# Patient Record
Sex: Female | Born: 1955 | Race: White | Hispanic: No | Marital: Married | Smoking: Former smoker
Health system: Southern US, Community
[De-identification: ages and names within clinical notes are randomized; demographics above are authoritative.]

## PROBLEM LIST (undated history)

## (undated) DIAGNOSIS — Z8639 Personal history of other endocrine, nutritional and metabolic disease: Secondary | ICD-10-CM

## (undated) DIAGNOSIS — Z87891 Personal history of nicotine dependence: Secondary | ICD-10-CM

## (undated) DIAGNOSIS — E785 Hyperlipidemia, unspecified: Secondary | ICD-10-CM

## (undated) DIAGNOSIS — I10 Essential (primary) hypertension: Secondary | ICD-10-CM

## (undated) HISTORY — PX: APPENDECTOMY: SHX54

## (undated) HISTORY — DX: Essential (primary) hypertension: I10

## (undated) HISTORY — PX: TONSILLECTOMY AND ADENOIDECTOMY: SHX28

## (undated) HISTORY — PX: KNEE ARTHROSCOPY: SHX127

## (undated) HISTORY — DX: Hyperlipidemia, unspecified: E78.5

## (undated) HISTORY — DX: Personal history of other endocrine, nutritional and metabolic disease: Z86.39

## (undated) HISTORY — DX: Personal history of nicotine dependence: Z87.891

---

## 2000-05-06 ENCOUNTER — Encounter: Admission: RE | Admit: 2000-05-06 | Discharge: 2000-05-06 | Payer: Self-pay | Admitting: Pediatrics

## 2000-05-06 ENCOUNTER — Encounter: Payer: Self-pay | Admitting: Obstetrics and Gynecology

## 2000-05-19 ENCOUNTER — Encounter: Payer: Self-pay | Admitting: Obstetrics and Gynecology

## 2000-05-19 ENCOUNTER — Encounter: Admission: RE | Admit: 2000-05-19 | Discharge: 2000-05-19 | Payer: Self-pay | Admitting: Obstetrics and Gynecology

## 2000-12-20 ENCOUNTER — Other Ambulatory Visit: Admission: RE | Admit: 2000-12-20 | Discharge: 2000-12-20 | Payer: Self-pay | Admitting: Obstetrics and Gynecology

## 2003-04-26 ENCOUNTER — Encounter: Admission: RE | Admit: 2003-04-26 | Discharge: 2003-04-26 | Payer: Self-pay | Admitting: Obstetrics and Gynecology

## 2003-04-26 ENCOUNTER — Encounter: Payer: Self-pay | Admitting: Obstetrics and Gynecology

## 2004-04-15 ENCOUNTER — Encounter: Admission: RE | Admit: 2004-04-15 | Discharge: 2004-04-15 | Payer: Self-pay | Admitting: Orthopedic Surgery

## 2004-05-11 ENCOUNTER — Other Ambulatory Visit: Admission: RE | Admit: 2004-05-11 | Discharge: 2004-05-11 | Payer: Self-pay | Admitting: Obstetrics and Gynecology

## 2004-05-26 ENCOUNTER — Ambulatory Visit (HOSPITAL_COMMUNITY): Admission: RE | Admit: 2004-05-26 | Discharge: 2004-05-26 | Payer: Self-pay | Admitting: Obstetrics and Gynecology

## 2004-06-26 ENCOUNTER — Encounter: Admission: RE | Admit: 2004-06-26 | Discharge: 2004-06-26 | Payer: Self-pay | Admitting: *Deleted

## 2005-02-15 ENCOUNTER — Encounter: Admission: RE | Admit: 2005-02-15 | Discharge: 2005-02-15 | Payer: Self-pay | Admitting: Obstetrics and Gynecology

## 2005-05-27 ENCOUNTER — Encounter: Admission: RE | Admit: 2005-05-27 | Discharge: 2005-05-27 | Payer: Self-pay | Admitting: Obstetrics and Gynecology

## 2005-05-27 ENCOUNTER — Other Ambulatory Visit: Admission: RE | Admit: 2005-05-27 | Discharge: 2005-05-27 | Payer: Self-pay | Admitting: Obstetrics and Gynecology

## 2006-07-22 ENCOUNTER — Other Ambulatory Visit: Admission: RE | Admit: 2006-07-22 | Discharge: 2006-07-22 | Payer: Self-pay | Admitting: Obstetrics & Gynecology

## 2007-08-03 ENCOUNTER — Other Ambulatory Visit: Admission: RE | Admit: 2007-08-03 | Discharge: 2007-08-03 | Payer: Self-pay | Admitting: Obstetrics and Gynecology

## 2007-08-10 ENCOUNTER — Ambulatory Visit: Payer: Self-pay | Admitting: Surgery

## 2007-09-15 ENCOUNTER — Ambulatory Visit (HOSPITAL_COMMUNITY): Admission: RE | Admit: 2007-09-15 | Discharge: 2007-09-15 | Payer: Self-pay | Admitting: Internal Medicine

## 2008-08-26 ENCOUNTER — Other Ambulatory Visit: Admission: RE | Admit: 2008-08-26 | Discharge: 2008-08-26 | Payer: Self-pay | Admitting: Obstetrics and Gynecology

## 2009-06-02 ENCOUNTER — Ambulatory Visit: Payer: Self-pay | Admitting: Surgery

## 2010-06-22 ENCOUNTER — Emergency Department (HOSPITAL_COMMUNITY)
Admission: EM | Admit: 2010-06-22 | Discharge: 2010-06-22 | Payer: Self-pay | Source: Home / Self Care | Admitting: Emergency Medicine

## 2010-10-19 ENCOUNTER — Emergency Department (HOSPITAL_COMMUNITY): Payer: Private Health Insurance - Indemnity

## 2010-10-19 ENCOUNTER — Emergency Department (HOSPITAL_COMMUNITY)
Admission: EM | Admit: 2010-10-19 | Discharge: 2010-10-19 | Disposition: A | Discharge status: Home / Self Care | Payer: Private Health Insurance - Indemnity | Attending: Emergency Medicine | Admitting: Emergency Medicine

## 2010-10-19 DIAGNOSIS — M25569 Pain in unspecified knee: Secondary | ICD-10-CM | POA: Insufficient documentation

## 2010-10-19 DIAGNOSIS — Z79899 Other long term (current) drug therapy: Secondary | ICD-10-CM | POA: Insufficient documentation

## 2010-10-19 DIAGNOSIS — E7800 Pure hypercholesterolemia, unspecified: Secondary | ICD-10-CM | POA: Insufficient documentation

## 2010-10-19 DIAGNOSIS — R209 Unspecified disturbances of skin sensation: Secondary | ICD-10-CM | POA: Insufficient documentation

## 2010-10-19 LAB — URINALYSIS, ROUTINE W REFLEX MICROSCOPIC
Bilirubin Urine: NEGATIVE
Hgb urine dipstick: NEGATIVE
Ketones, ur: NEGATIVE mg/dL
Nitrite: NEGATIVE
Protein, ur: NEGATIVE mg/dL
Urobilinogen, UA: 0.2 mg/dL (ref 0.0–1.0)
pH: 5 (ref 5.0–8.0)

## 2010-10-19 LAB — COMPREHENSIVE METABOLIC PANEL
ALT: 17 U/L (ref 0–35)
AST: 16 U/L (ref 0–37)
Albumin: 4.2 g/dL (ref 3.5–5.2)
Alkaline Phosphatase: 67 U/L (ref 39–117)
BUN: 14 mg/dL (ref 6–23)
CO2: 25 mEq/L (ref 19–32)
Calcium: 9.6 mg/dL (ref 8.4–10.5)
Creatinine, Ser: 1.05 mg/dL (ref 0.4–1.2)
GFR calc Af Amer: >60 mL/min (ref >60)
Glucose, Bld: 100 mg/dL — ABNORMAL HIGH (ref 70–99)
Potassium: 4 mEq/L (ref 3.5–5.1)
Sodium: 136 mEq/L (ref 135–145)
Total Bilirubin: 0.6 mg/dL (ref 0.3–1.2)
Total Protein: 8.1 g/dL (ref 6.0–8.3)

## 2010-10-19 LAB — PROTIME-INR
INR: 0.96 (ref 0.00–1.49)
Prothrombin Time: 13 seconds (ref 11.6–15.2)

## 2010-10-19 LAB — POCT CARDIAC MARKERS
CKMB, poc: <1 ng/mL — ABNORMAL LOW (ref 1.0–8.0)
Myoglobin, poc: 91.1 ng/mL (ref 12–200)
Troponin i, poc: <0.05 ng/mL (ref 0.00–0.09)

## 2010-10-19 LAB — CBC
MCH: 29.7 pg (ref 26.0–34.0)
MCHC: 34.9 g/dL (ref 30.0–36.0)
MCV: 85 fL (ref 78.0–100.0)
Platelets: 269 10*3/uL (ref 150–400)
RBC: 4.99 MIL/uL (ref 3.87–5.11)
RDW: 13.2 % (ref 11.5–15.5)
WBC: 7.6 10*3/uL (ref 4.0–10.5)

## 2010-10-19 LAB — APTT: aPTT: 35 seconds (ref 24–37)

## 2010-11-10 HISTORY — PX: MEDIAL PARTIAL KNEE REPLACEMENT: SHX5965

## 2010-11-24 NOTE — Procedures (Signed)
CAROTID DUPLEX EXAM   INDICATION:  Left carotid bruit.   HISTORY:  Diabetes:  No.  Cardiac:  No.  Hypertension:  No.  Smoking:  Yes.  Previous Surgery:  CV History:  Amaurosis Fugax No, Paresthesias No, Hemiparesis No                                       RIGHT             LEFT  Brachial systolic pressure:         N/A               N/A  Brachial Doppler waveforms:  Vertebral direction of flow:        Antegrade         Antegrade  DUPLEX VELOCITIES (cm/sec)  CCA peak systolic                   97                95  ECA peak systolic                   117               86  ICA peak systolic                   90                160 (distal)  ICA end diastolic                   34                65  PLAQUE MORPHOLOGY:                  None              Heterogenous  PLAQUE AMOUNT:                      None              Moderate  PLAQUE LOCATION:                    None              ECA   IMPRESSION:  1. 40-59% stenosis noted in the left internal carotid artery.  2. Normal carotid duplex noted in the right internal carotid artery.  3. Antegrade bilateral vertebral arteries.   ___________________________________________  V. Charlena Cross, MD   MG/MEDQ  D:  08/10/2007  T:  08/11/2007  Job:  [id number]

## 2010-11-24 NOTE — Procedures (Signed)
CAROTID DUPLEX EXAM   INDICATION:  Follow up known carotid artery disease.   HISTORY:  Diabetes:  No.  Cardiac:  No.  Hypertension:  No.  Smoking:  Yes.  Previous Surgery:  CV History:  Amaurosis Fugax No, Paresthesias No, Hemiparesis No.                                       RIGHT             LEFT  Brachial systolic pressure:         118               110  Brachial Doppler waveforms:         Biphasic          Biphasic  Vertebral direction of flow:        Antegrade         Antegrade  DUPLEX VELOCITIES (cm/sec)  CCA peak systolic                   93                68  ECA peak systolic                   113               77  ICA peak systolic                   105               133 (distal)  ICA end diastolic                   48                46  PLAQUE MORPHOLOGY:                  Heterogenous      Heterogenous  PLAQUE AMOUNT:                      Mild              Mild  PLAQUE LOCATION:                    ICA               ICA   IMPRESSION:  1. 40-59% stenosis noted in the left distal internal carotid artery.  2. 20-39% stenosis noted in the right internal carotid artery.  3. Antegrade bilateral vertebral arteries.  4. Study unchanged from previous.   ___________________________________________  V. Charlena Cross, MD   MG/MEDQ  D:  06/02/2009  T:  06/03/2009  Job:  [id number]   cc:   Kari Baars, M.D.

## 2011-07-26 ENCOUNTER — Ambulatory Visit (INDEPENDENT_AMBULATORY_CARE_PROVIDER_SITE_OTHER): Payer: BC Managed Care – PPO | Admitting: *Deleted

## 2011-07-26 DIAGNOSIS — I6529 Occlusion and stenosis of unspecified carotid artery: Secondary | ICD-10-CM

## 2011-08-02 NOTE — Procedures (Unsigned)
CAROTID DUPLEX EXAM  INDICATION:  Followup carotid disease  HISTORY: Diabetes:  No Cardiac:  No Hypertension:  No Smoking:  Previous Previous Surgery:  None CV History: Amaurosis Fugax No, Paresthesias No, Hemiparesis No                                      RIGHT             LEFT Brachial systolic pressure:         110               110 Brachial Doppler waveforms:         WNL               WNL Vertebral direction of flow:        Antegrade         Antegrade DUPLEX VELOCITIES (cm/sec) CCA peak systolic                   94                84 ECA peak systolic                   102               76 ICA peak systolic                   79                70 ICA end diastolic                   23                24 PLAQUE MORPHOLOGY: PLAQUE AMOUNT:                      None              None PLAQUE LOCATION:  IMPRESSION:  Normal study of the bilateral carotid arteries.  ___________________________________________ V. Charlena Cross, MD  LT/MEDQ  D:  07/27/2011  T:  07/27/2011  Job:  [id number]

## 2011-09-03 LAB — HM PAP SMEAR: HM Pap smear: NEGATIVE

## 2011-12-03 IMAGING — CT CT HEAD W/O CM
1 series · 16 of 30 positions shown, 20 images · non-contrast
Comparison: None

CLINICAL DATA: Episode of numbness and tingling in left hand

CT HEAD WITHOUT CONTRAST
TECHNIQUE: Contiguous axial images were obtained from the base of
the skull through the vertex without contrast.

[Series 2: head routine 4.8 h37s · axial · 0.43mm/px · z∈[+133,+268]mm · 16 of 30 slices shown, 20 images]
[im 2/30  brain]
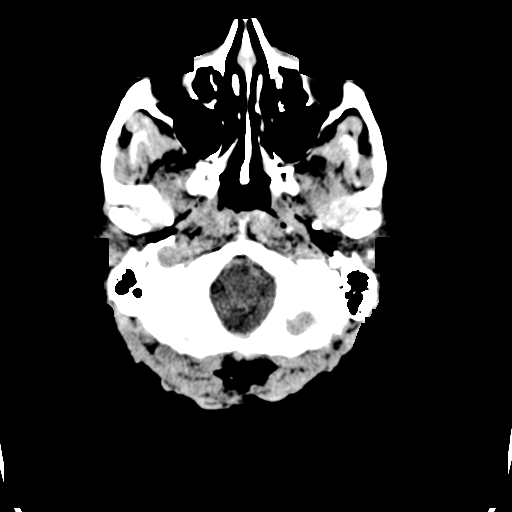
[im 2/30  bone]
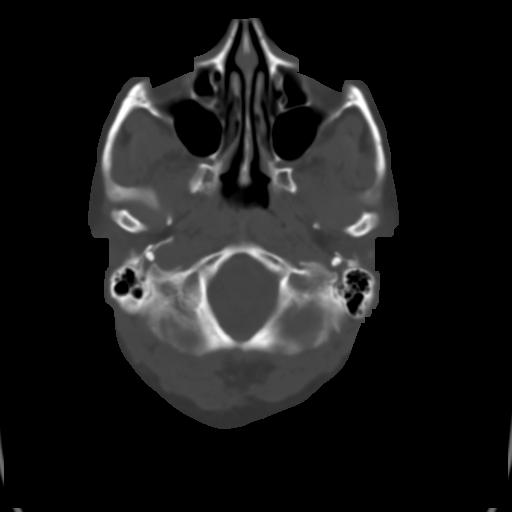
[im 4/30  brain]
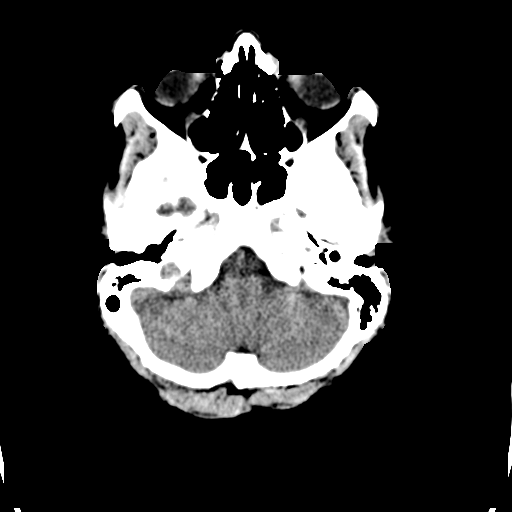
[im 6/30  brain]
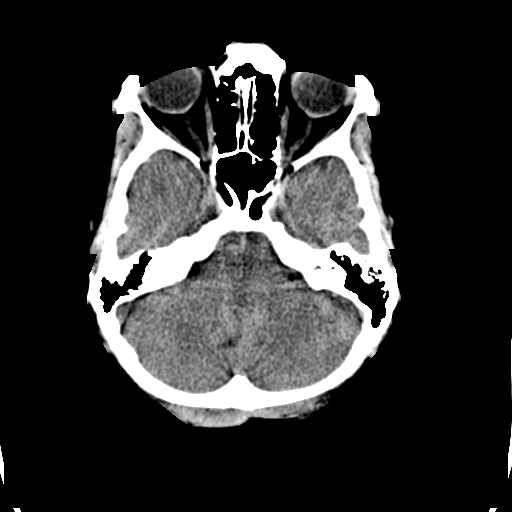
[im 8/30  brain]
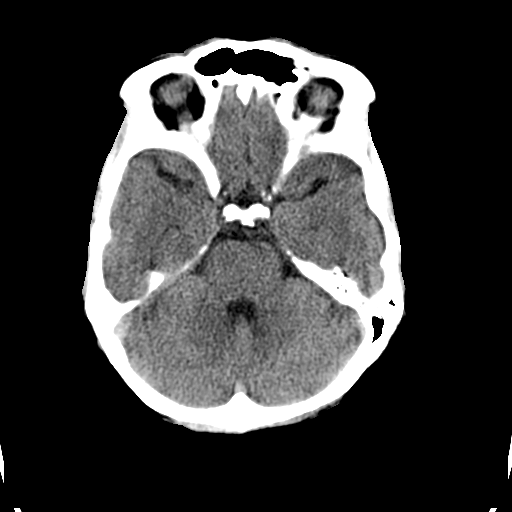
[im 9/30  brain]
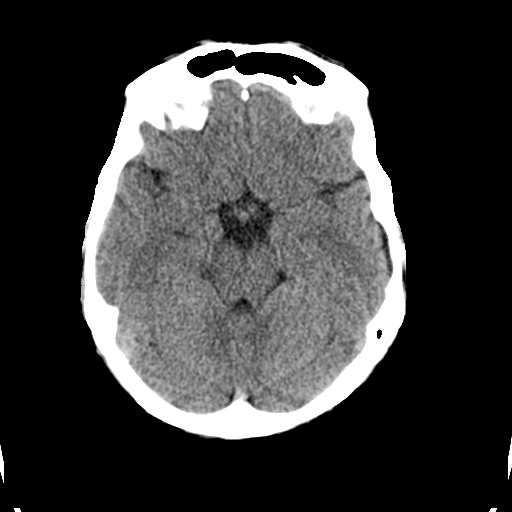
[im 9/30  bone]
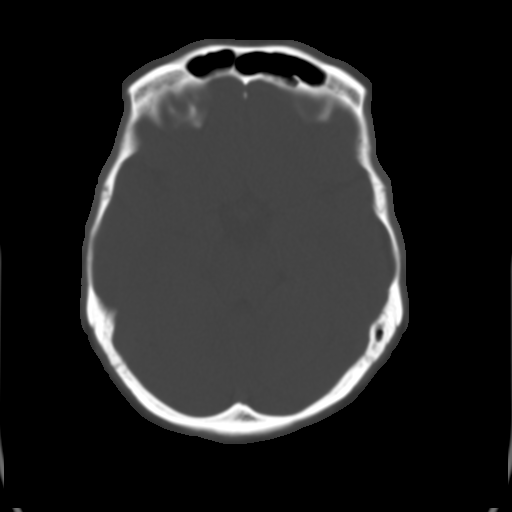
[im 11/30  brain]
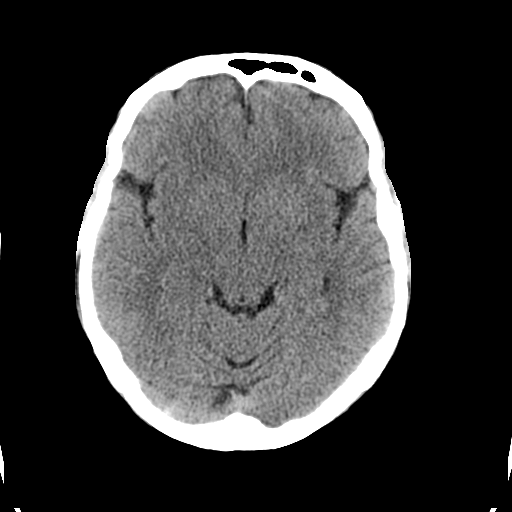
[im 13/30  brain]
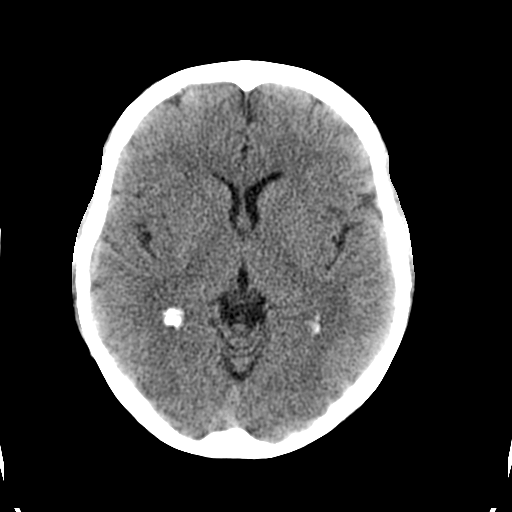
[im 15/30  brain]
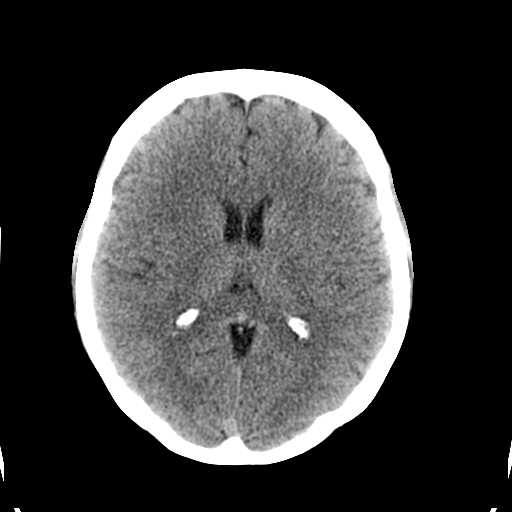
[im 16/30  brain]
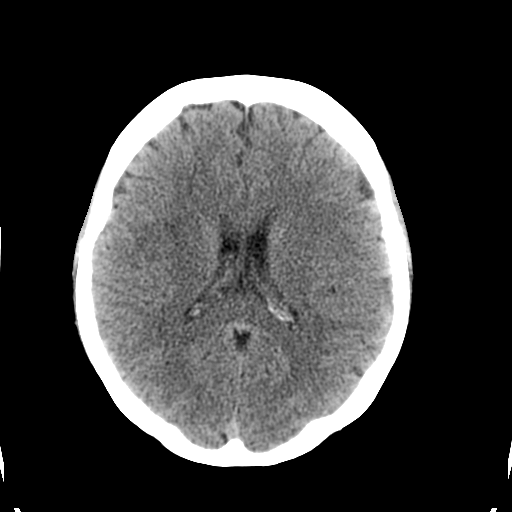
[im 16/30  bone]
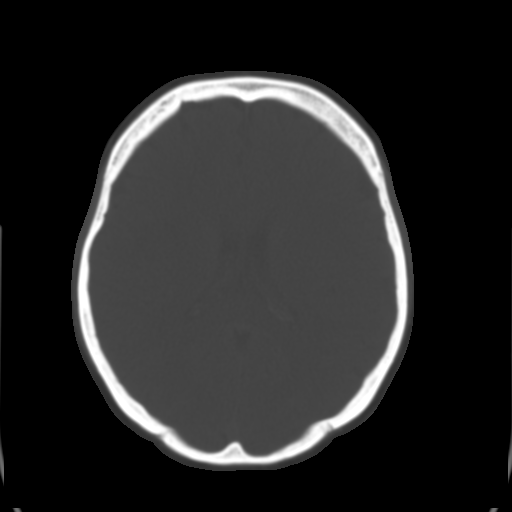
[im 18/30  brain]
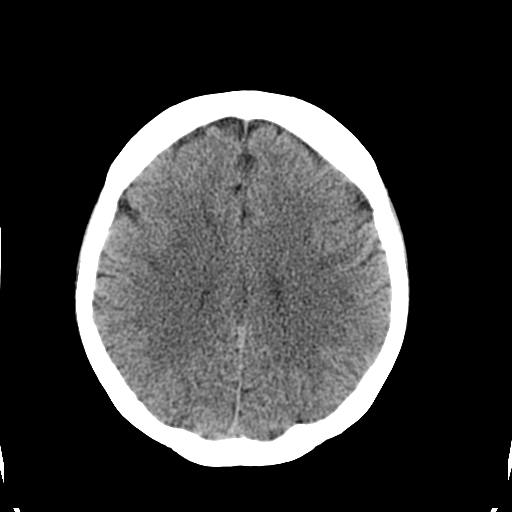
[im 20/30  brain]
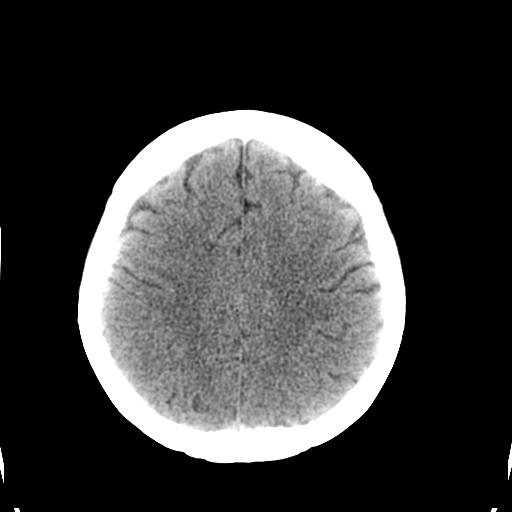
[im 22/30  brain]
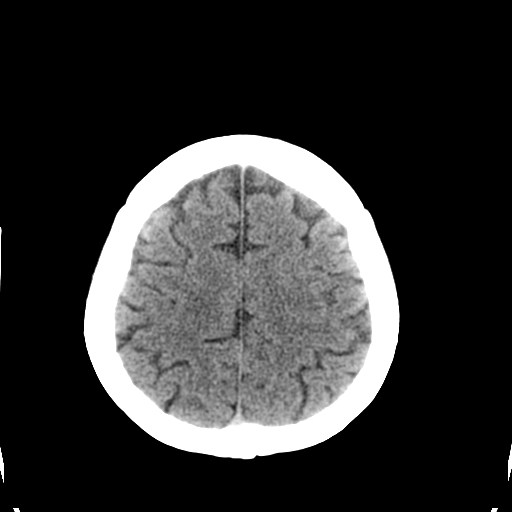
[im 23/30  brain]
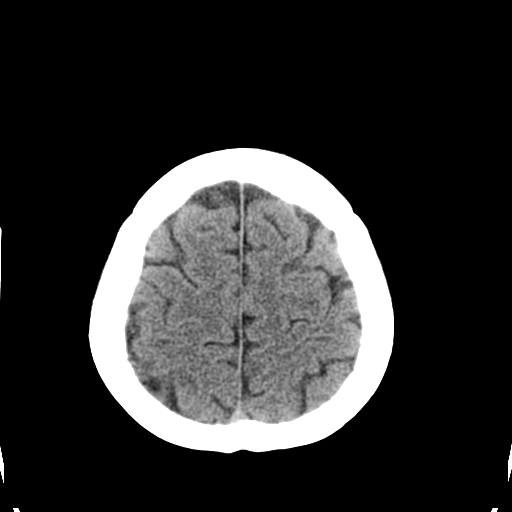
[im 23/30  bone]
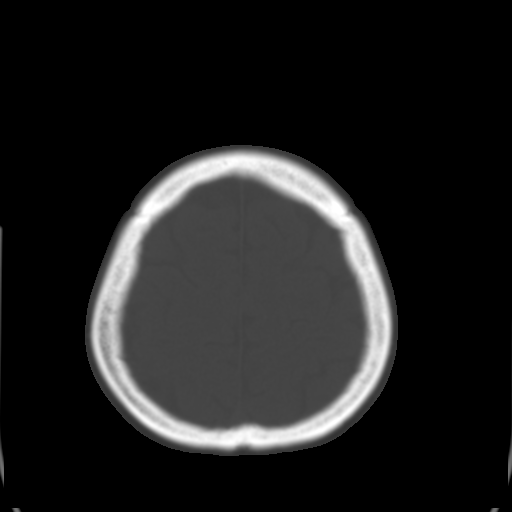
[im 25/30  brain]
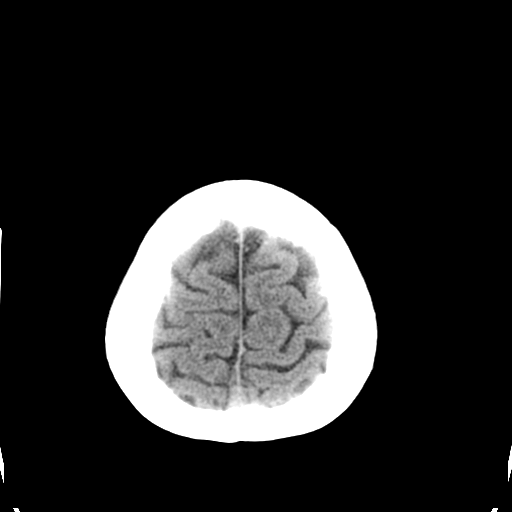
[im 27/30  brain]
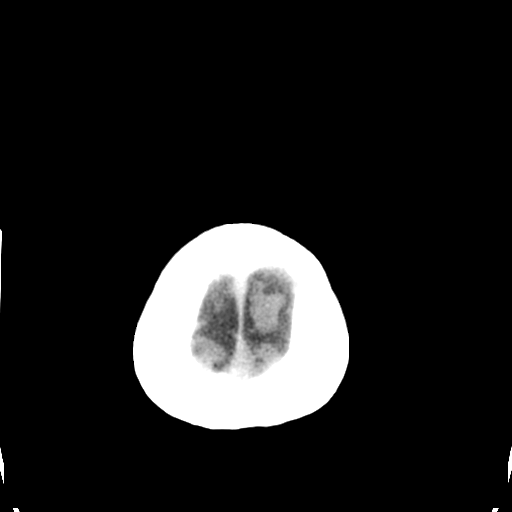
[im 29/30  brain]
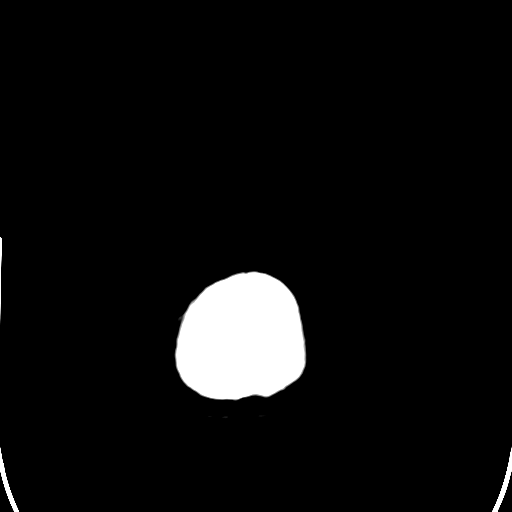

[16 of 30 positions shown; findings below may reference images not displayed]

FINDINGS: Normal ventricular morphology.
No midline shift or mass effect.
Normal appearance of brain parenchyma.
No intracranial hemorrhage, mass lesion, or acute infarction.
Visualized paranasal sinuses and mastoid air cells clear.
Bones unremarkable.
IMPRESSION: No acute intracranial abnormalities.
If symptoms persist or recur, recommend assessment by MR imaging of
the brain with and without contrast to further evaluate.

## 2013-09-06 ENCOUNTER — Ambulatory Visit: Payer: Self-pay | Admitting: Nurse Practitioner

## 2013-09-24 ENCOUNTER — Encounter: Payer: Self-pay | Admitting: Nurse Practitioner

## 2013-09-24 ENCOUNTER — Ambulatory Visit (INDEPENDENT_AMBULATORY_CARE_PROVIDER_SITE_OTHER): Payer: BC Managed Care – PPO | Admitting: Nurse Practitioner

## 2013-09-24 VITALS — BP 140/74 | HR 76 | Ht 63.25 in | Wt 169.0 lb

## 2013-09-24 DIAGNOSIS — R319 Hematuria, unspecified: Secondary | ICD-10-CM

## 2013-09-24 DIAGNOSIS — Z96659 Presence of unspecified artificial knee joint: Secondary | ICD-10-CM

## 2013-09-24 DIAGNOSIS — Z01419 Encounter for gynecological examination (general) (routine) without abnormal findings: Secondary | ICD-10-CM

## 2013-09-24 DIAGNOSIS — Z0000 Encounter for general adult medical examination without abnormal findings: Secondary | ICD-10-CM

## 2013-09-24 LAB — POCT URINALYSIS DIPSTICK
Bilirubin, UA: NEGATIVE
Glucose, UA: NEGATIVE
Ketones, UA: NEGATIVE
Nitrite, UA: NEGATIVE
Protein, UA: NEGATIVE
Urobilinogen, UA: NEGATIVE
pH, UA: 5

## 2013-09-24 NOTE — Progress Notes (Signed)
Patient ID: Karen Jimenez, female   DOB: Oct 24, 1955, 58 y.o.   MRN: [id number] 58 y.o. G2P2002 Married Caucasian Fe here for annual exam.  May consider total knee replacement in the future. Now sees Dr. Despina Hick.  Patient's last menstrual period was 04/11/2009.          Sexually active: no  The current method of family planning is abstinence and post menopausal status.    Exercising: yes  Gym/ health club routine includes personal trainer 3 days per week. Smoker:  yes  Health Maintenance: Pap:  09/03/11, WNL, neg HR HPV MMG:  08/10/12, Bi-Rads 1: negative Colonoscopy:  12/2012, 2 polyps benign, repeat 5 years due to family history BMD:  08/2011, normal TDaP:  UTD?  Labs:  HB: declined Urine:  Trace RBC, 1+ leuk's, pH 5.0   reports that she has been smoking Cigarettes.  She has a 7.5 pack-year smoking history. She has never used smokeless tobacco. She reports that she does not drink alcohol or use illicit drugs.  Past Medical History  Diagnosis Date  . History of smoking     Past Surgical History  Procedure Laterality Date  . Tonsillectomy and adenoidectomy    . Appendectomy    . Knee arthroscopy Right 2011, 2012  . Medial partial knee replacement  11/2010    Current Outpatient Prescriptions  Medication Sig Dispense Refill  . calcium carbonate (OS-CAL) 600 MG TABS tablet Take 600 mg by mouth 2 (two) times daily with a meal.      . cholecalciferol (VITAMIN D) 1000 UNITS tablet Take 1,000 Units by mouth daily.      . Omega-3 Fatty Acids (FISH OIL PO) Take by mouth.      . vitamin C (ASCORBIC ACID) 500 MG tablet Take 500 mg by mouth daily.       No current facility-administered medications for this visit.    Family History  Problem Relation Age of Onset  . Colon cancer Mother 45  . Lung cancer Father 36    ROS:  Pertinent items are noted in HPI.  Otherwise, a comprehensive ROS was negative.  Exam:   BP 140/74  Pulse 76  Ht 5' 3.25" (1.607 m)  Wt 169 lb (76.658 kg)  BMI  29.68 kg/m2  LMP 04/11/2009 Height: 5' 3.25" (160.7 cm)  Ht Readings from Last 3 Encounters:  09/24/13 5' 3.25" (1.607 m)  weight last year at 174.  General appearance: alert, cooperative and appears stated age Head: Normocephalic, without obvious abnormality, atraumatic Neck: no adenopathy, supple, symmetrical, trachea midline and thyroid normal to inspection and palpation Lungs: clear to auscultation bilaterally Breasts: normal appearance, no masses or tenderness Heart: regular rate and rhythm Abdomen: soft, non-tender; no masses,  no organomegaly Extremities: extremities normal, atraumatic, no cyanosis or edema Skin: Skin color, texture, turgor normal. No rashes or lesions Lymph nodes: Cervical, supraclavicular, and axillary nodes normal. No abnormal inguinal nodes palpated Neurologic: Grossly normal   Pelvic: External genitalia:  no lesions              Urethra:  normal appearing urethra with no masses, tenderness or lesions              Bartholin's and Skene's: normal                 Vagina: normal appearing vagina with normal color and discharge, no lesions              Cervix: anteverted  Pap taken: no Bimanual Exam:  Uterus:  normal size, contour, position, consistency, mobility, non-tender              Adnexa: no mass, fullness, tenderness               Rectovaginal: Confirms               Anus:  normal sphincter tone, no lesions  A:  Well Woman with normal exam  Postmenopausal  no HRT  S/P partial knee 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

## 2013-09-24 NOTE — Patient Instructions (Signed)

## 2013-09-25 LAB — URINALYSIS, MICROSCOPIC ONLY
Bacteria, UA: NONE SEEN
Casts: NONE SEEN
Crystals: NONE SEEN

## 2013-09-25 LAB — URINE CULTURE
Colony Count: NO GROWTH
Organism ID, Bacteria: NO GROWTH

## 2013-09-25 NOTE — Progress Notes (Signed)
Encounter reviewed by Dr. Darcus Edds Silva.  

## 2013-09-28 ENCOUNTER — Telehealth: Payer: Self-pay | Admitting: Nurse Practitioner

## 2013-09-28 NOTE — Telephone Encounter (Signed)
Message copied by Joeseph AmorFAST, Taseen Marasigan L on Fri Sep 28, 2013  2:00 PM ------      Message from: Verner CholLEONARD, DEBORAH S      Created: Wed Sep 26, 2013  1:20 PM       Notify patient no RBCs noted on microscopic and culture was negative.      Is she having any vaginal symptoms? The specimen showed some WBC's which can come from vaginal irritation. Will need OV to reassess ------

## 2013-09-28 NOTE — Telephone Encounter (Signed)
Spoke with patient and message from Karen Jimenez CNM given. Patient denies any vaginal complaints. States "I feel fine".  Patient will monitor symptoms and return call if any change.   Routing to provider for final review. Patient agreeable to disposition. Will close encounter

## 2013-09-28 NOTE — Telephone Encounter (Signed)
Patient wants to know if we have received results from appt on 09/25/13 yet

## 2014-05-13 ENCOUNTER — Encounter: Payer: Self-pay | Admitting: Nurse Practitioner

## 2014-06-29 ENCOUNTER — Emergency Department (HOSPITAL_BASED_OUTPATIENT_CLINIC_OR_DEPARTMENT_OTHER)
Admission: EM | Admit: 2014-06-29 | Discharge: 2014-06-29 | Disposition: A | Discharge status: Home / Self Care | Payer: BC Managed Care – PPO | Attending: Emergency Medicine | Admitting: Emergency Medicine

## 2014-06-29 ENCOUNTER — Encounter (HOSPITAL_BASED_OUTPATIENT_CLINIC_OR_DEPARTMENT_OTHER): Payer: Self-pay | Admitting: *Deleted

## 2014-06-29 DIAGNOSIS — Z79899 Other long term (current) drug therapy: Secondary | ICD-10-CM | POA: Diagnosis not present

## 2014-06-29 DIAGNOSIS — Z720 Tobacco use: Secondary | ICD-10-CM | POA: Diagnosis not present

## 2014-06-29 DIAGNOSIS — M79605 Pain in left leg: Secondary | ICD-10-CM | POA: Diagnosis present

## 2014-06-29 DIAGNOSIS — M79604 Pain in right leg: Secondary | ICD-10-CM | POA: Insufficient documentation

## 2014-06-29 LAB — URINE MICROSCOPIC-ADD ON

## 2014-06-29 LAB — BASIC METABOLIC PANEL
Anion gap: 16 — ABNORMAL HIGH (ref 5–15)
BUN: 17 mg/dL (ref 6–23)
CALCIUM: 9.4 mg/dL (ref 8.4–10.5)
CO2: 22 mEq/L (ref 19–32)
Chloride: 108 mEq/L (ref 96–112)
Creatinine, Ser: 1.2 mg/dL — ABNORMAL HIGH (ref 0.50–1.10)
GFR calc Af Amer: 57 mL/min — ABNORMAL LOW (ref >90)
GFR calc non Af Amer: 49 mL/min — ABNORMAL LOW (ref >90)
Glucose, Bld: 120 mg/dL — ABNORMAL HIGH (ref 70–99)
Potassium: 3.9 mEq/L (ref 3.7–5.3)
Sodium: 146 mEq/L (ref 137–147)

## 2014-06-29 LAB — URINALYSIS, ROUTINE W REFLEX MICROSCOPIC
Bilirubin Urine: NEGATIVE
Glucose, UA: NEGATIVE mg/dL
Hgb urine dipstick: NEGATIVE
Ketones, ur: NEGATIVE mg/dL
Nitrite: NEGATIVE
Protein, ur: NEGATIVE mg/dL
Specific Gravity, Urine: 1.017 (ref 1.005–1.030)
UROBILINOGEN UA: 0.2 mg/dL (ref 0.0–1.0)
pH: 5.5 (ref 5.0–8.0)

## 2014-06-29 MED ORDER — OXYCODONE-ACETAMINOPHEN 5-325 MG PO TABS: 1.0000 | ORAL_TABLET | Freq: Four times a day (QID) | ORAL | Status: DC | PRN

## 2014-06-29 MED ORDER — OXYCODONE-ACETAMINOPHEN 5-325 MG PO TABS
2.0000 | ORAL_TABLET | Freq: Once | ORAL | Status: DC
  Filled 2014-06-29: qty 2

## 2014-06-29 NOTE — ED Notes (Signed)
Pt reports bilateral leg pain x 1 day.  Denies injury.

## 2014-06-29 NOTE — ED Provider Notes (Signed)
CSN: [id number]     Arrival date & time 06/29/14  1735 History  This chart was scribed for Karen CookeyMegan Docherty, MD by Freida Busmaniana Omoyeni, ED Scribe. This patient was seen in room MH10/MH10 and the patient's care was started 6:15 PM.    Chief Complaint  Patient presents with  . Leg Pain      Patient is a 58 y.o. female presenting with leg pain. The history is provided by the patient. No language interpreter was used.  Leg Pain Location:  Leg Injury: no   Leg location:  L leg and R leg Pain details:    Quality:  Throbbing   Severity:  Moderate   Onset quality:  Gradual   Timing:  Constant   Progression:  Worsening Associated symptoms: no fever     HPI Comments:  Karen Jimenez is a 58 y.o. female who presents to the Emergency Department complaining of throbbing constant pain to her BLE that started last night. She also notes soreness to her bilateral hips and states pain radiates down to her feet. Pt states she was walking a lot yesterday christmas shopping but notes she was wearing comfortable shoes. She reports wearing compression stockings without relief. She denies swelling to her BLE, BLE weakness/numbness, cough, rhinorrhea, CP, SOB, nausea, vomiting, congestion, bladder/bowel incontinence, shoulder pain, pain to her BUE and abdominal pain. She also denies recent air/car travel.    Past Medical History  Diagnosis Date  . History of smoking    Past Surgical History  Procedure Laterality Date  . Tonsillectomy and adenoidectomy    . Appendectomy    . Knee arthroscopy Right 2011, 2012  . Medial partial knee replacement  11/2010   Family History  Problem Relation Age of Onset  . Colon cancer Mother 481  . Lung cancer Father 4178   History  Substance Use Topics  . Smoking status: Current Every Day Smoker -- 0.25 packs/day for 30 years    Types: Cigarettes  . Smokeless tobacco: Never Used  . Alcohol Use: No   OB History    Gravida Para Term Preterm AB TAB SAB Ectopic  Multiple Living   2 2 2       2      Review of Systems  Constitutional: Negative for fever and chills.  HENT: Negative for congestion, rhinorrhea and sore throat.   Respiratory: Negative for cough.   Cardiovascular: Negative for chest pain.  Gastrointestinal: Negative for nausea, vomiting, diarrhea and constipation.  Genitourinary: Negative for dysuria and hematuria.  Musculoskeletal: Positive for myalgias.  Neurological: Negative for weakness and numbness.  All other systems reviewed and are negative.     Allergies  Hydrocodone  Home Medications   Prior to Admission medications   Medication Sig Start Date End Date Taking? Authorizing Provider  calcium carbonate (OS-CAL) 600 MG TABS tablet Take 600 mg by mouth 2 (two) times daily with a meal.    Historical Provider, MD  cholecalciferol (VITAMIN D) 1000 UNITS tablet Take 1,000 Units by mouth daily.    Historical Provider, MD  Omega-3 Fatty Acids (FISH OIL PO) Take by mouth.    Historical Provider, MD  oxyCODONE-acetaminophen (PERCOCET) 5-325 MG per tablet Take 1 tablet by mouth every 6 (six) hours as needed. 06/29/14   Karen CookeyMegan Docherty, MD  vitamin C (ASCORBIC ACID) 500 MG tablet Take 500 mg by mouth daily.    Historical Provider, MD   BP 138/43 mmHg  Pulse 81  Temp(Src) 98.3 F (36.8 C) (Oral)  Resp  18  Ht 5\' 4"  (1.626 m)  Wt 170 lb (77.111 kg)  BMI 29.17 kg/m2  SpO2 99%  LMP 04/11/2009 Physical Exam  Constitutional: She is oriented to person, place, and time. She appears well-developed and well-nourished. No distress.  HENT:  Head: Normocephalic.  Mouth/Throat: Oropharynx is clear and moist.  Eyes: Pupils are equal, round, and reactive to light.  Neck: Neck supple.  Cardiovascular: Normal rate, regular rhythm and normal heart sounds.   Pulmonary/Chest: Effort normal and breath sounds normal. No respiratory distress. She has no wheezes.  Abdominal: Soft. She exhibits no distension. There is no tenderness. There is no  rebound and no guarding.  Musculoskeletal: She exhibits tenderness. She exhibits no edema.  Generalized muscular tenderness without overlying skin changes or eedma  Neurological: She is alert and oriented to person, place, and time.  Skin: Skin is warm and dry.  Psychiatric: She has a normal mood and affect.  Nursing note and vitals reviewed.   ED Course  Procedures (including critical care time) Labs Review Labs Reviewed  BASIC METABOLIC PANEL - Abnormal; Notable for the following:    Glucose, Bld 120 (*)    Creatinine, Ser 1.20 (*)    GFR calc non Af Amer 49 (*)    GFR calc Af Amer 57 (*)    Anion gap 16 (*)    All other components within normal limits  URINALYSIS, ROUTINE W REFLEX MICROSCOPIC - Abnormal; Notable for the following:    Leukocytes, UA TRACE (*)    All other components within normal limits  URINE CULTURE  URINE MICROSCOPIC-ADD ON    Imaging Review No results found.   EKG Interpretation None      MDM   Final diagnoses:  Bilateral leg pain    Pt is a 58 y.o. female with Pmhx as above who presents with BL generalized leg pain for 1 day w/o known injury, though di dwalk more than usual out christmas shopping yesterday. No s/sx of infection ofther than myalgias and PE benign except for symmetric muscular tenderness of both legs. BMP with mild Cr elevated, otherwise nml, UA nml. I do not think Cr explains symptoms and I feel she may have prodromal viral syndrome symptoms. I feel she is safe for d/c      Karen EastMargaret M Teems evaluation in the Emergency Department is complete. It has been determined that no acute conditions requiring further emergency intervention are present at this time. The patient/guardian have been advised of the diagnosis and plan. We have discussed signs and symptoms that warrant return to the ED, such as changes or worsening in symptoms, worsening pain, fever, inability to tolerate liquids.     I personally performed the services  described in this documentation, which was scribed in my presence. The recorded information has been reviewed and is accurate.     Karen CookeyMegan Docherty, MD 07/02/14 [phone]

## 2014-06-29 NOTE — Discharge Instructions (Signed)

## 2014-07-01 LAB — URINE CULTURE
Colony Count: NO GROWTH
Culture: NO GROWTH

## 2014-10-03 ENCOUNTER — Ambulatory Visit (INDEPENDENT_AMBULATORY_CARE_PROVIDER_SITE_OTHER): Payer: BLUE CROSS/BLUE SHIELD | Admitting: Nurse Practitioner

## 2014-10-03 ENCOUNTER — Encounter: Payer: Self-pay | Admitting: Nurse Practitioner

## 2014-10-03 VITALS — BP 110/74 | HR 76 | Ht 63.25 in | Wt 163.0 lb

## 2014-10-03 DIAGNOSIS — Z01419 Encounter for gynecological examination (general) (routine) without abnormal findings: Secondary | ICD-10-CM

## 2014-10-03 DIAGNOSIS — R319 Hematuria, unspecified: Secondary | ICD-10-CM

## 2014-10-03 DIAGNOSIS — Z0000 Encounter for general adult medical examination without abnormal findings: Secondary | ICD-10-CM | POA: Diagnosis not present

## 2014-10-03 LAB — POCT URINALYSIS DIPSTICK
Bilirubin, UA: NEGATIVE
Glucose, UA: NEGATIVE
Ketones, UA: NEGATIVE
Leukocytes, UA: NEGATIVE
Nitrite, UA: NEGATIVE
Protein, UA: NEGATIVE
Urobilinogen, UA: NEGATIVE
pH, UA: 5

## 2014-10-03 NOTE — Patient Instructions (Signed)

## 2014-10-03 NOTE — Progress Notes (Signed)
Patient ID: Karen Jimenez, female   DOB: 26-Jul-1955, 59 y.o.   MRN: [id number] 59 y.o. G75P2002 Married  Caucasian Fe here for annual exam.    Patient's last menstrual period was 04/11/2009 (within days).          Sexually active: No.  The current method of family planning is abstinence and post menopausal status.    Exercising: No.  The patient does not participate in regular exercise at present. Smoker:  yes  Health Maintenance: Pap:  09/03/11, negative with neg HR HPV MMG:  10/18/13, Bi-Rads 2:  Benign Colonoscopy:  12/2012, 2 benign polyps, repeat in 5 years due to family history BMD:   2013, normal TDaP:  UTD Labs:  HB:  Declined, PCP in June  Urine:  Trace RBC   reports that she has been smoking Cigarettes.  She has a 7.5 pack-year smoking history. She has never used smokeless tobacco. She reports that she does not drink alcohol or use illicit drugs.  Past Medical History  Diagnosis Date  . History of smoking     Past Surgical History  Procedure Laterality Date  . Tonsillectomy and adenoidectomy    . Appendectomy    . Knee arthroscopy Right 2011, 2012  . Medial partial knee replacement  11/2010    Current Outpatient Prescriptions  Medication Sig Dispense Refill  . calcium carbonate (OS-CAL) 600 MG TABS tablet Take 600 mg by mouth 2 (two) times daily with a meal.    . cholecalciferol (VITAMIN D) 1000 UNITS tablet Take 1,000 Units by mouth daily.    . Omega-3 Fatty Acids (FISH OIL PO) Take by mouth.    . vitamin C (ASCORBIC ACID) 500 MG tablet Take 500 mg by mouth daily.     No current facility-administered medications for this visit.    Family History  Problem Relation Age of Onset  . Colon cancer Mother 24  . Lung cancer Father 20    ROS:  Pertinent items are noted in HPI.  Otherwise, a comprehensive ROS was negative.  Exam:   BP 110/74 mmHg  Pulse 76  Ht 5' 3.25" (1.607 m)  Wt 163 lb (73.936 kg)  BMI 28.63 kg/m2  LMP 04/11/2009 (Within Days) Height: 5' 3.25"  (160.7 cm) Ht Readings from Last 3 Encounters:  10/03/14 5' 3.25" (1.607 m)  06/29/14  (1.626 m)  09/24/13 5' 3.25" (1.607 m)    General appearance: alert, cooperative and appears stated age Head: Normocephalic, without obvious abnormality, atraumatic Neck: no adenopathy, supple, symmetrical, trachea midline and thyroid normal to inspection and palpation Lungs: clear to auscultation bilaterally Breasts: normal appearance, no masses or tenderness Heart: regular rate and rhythm Abdomen: soft, non-tender; no masses,  no organomegaly Extremities: extremities normal, atraumatic, no cyanosis or edema Skin: Skin color, texture, turgor normal. No rashes or lesions Lymph nodes: Cervical, supraclavicular, and axillary nodes normal. No abnormal inguinal nodes palpated Neurologic: Grossly normal   Pelvic: External genitalia:  no lesions              Urethra:  normal appearing urethra with no masses, tenderness or lesions              Bartholin's and Skene's: normal                 Vagina: normal appearing vagina with normal color and discharge, no lesions              Cervix: anteverted  Pap taken: Yes.   Bimanual Exam:  Uterus:  normal size, contour, position, consistency, mobility, non-tender              Adnexa: no mass, fullness, tenderness               Rectovaginal: Confirms               Anus:  normal sphincter tone, no lesions  Chaperone present:  yes  A:  Well Woman with normal exam  Postmenopausal no HRT S/P partial knee replacement on right 11/2010 Haematuria - asymptomatic   P:   Reviewed health and wellness pertinent to exam  Pap smear taken today  Mammogram is due 4/16  Follow with pap and urine  Counseled on breast self exam, mammography screening, adequate intake of calcium and vitamin D, diet and exercise, Kegel's exercises return annually or prn  An After Visit Summary was printed and given to the patient.

## 2014-10-04 LAB — URINALYSIS, MICROSCOPIC ONLY
BACTERIA UA: NONE SEEN
Casts: NONE SEEN
Crystals: NONE SEEN
Squamous Epithelial / LPF: NONE SEEN

## 2014-10-06 NOTE — Progress Notes (Signed)
Encounter reviewed by Dr. Ivan Lacher Silva.  

## 2014-10-07 LAB — IPS PAP TEST WITH HPV

## 2015-05-26 ENCOUNTER — Telehealth: Payer: Self-pay | Admitting: Nurse Practitioner

## 2015-05-26 NOTE — Telephone Encounter (Signed)
Left patient a message to call back to reschedule a future appointment that was cancelled by the provider. °

## 2015-10-06 ENCOUNTER — Ambulatory Visit: Payer: BLUE CROSS/BLUE SHIELD | Admitting: Nurse Practitioner

## 2015-12-31 ENCOUNTER — Encounter: Payer: Self-pay | Admitting: Nurse Practitioner

## 2016-09-02 ENCOUNTER — Encounter: Payer: Self-pay | Admitting: Nurse Practitioner

## 2016-09-02 NOTE — Progress Notes (Signed)
Patient ID: Karen EastMargaret M Jimenez, female   DOB: 04/14/1956, 61 y.o.   MRN: [id number]  61 y.o. G2P2002 Married  Caucasian Fe here for annual exam.  Treated for UTI in 07/2016 with Cipro.     Patient's last menstrual period was 04/11/2009 (within days).          Sexually active: No.  The current method of family planning is post menopausal status.    Exercising: No.  The patient does not participate in regular exercise at present. Smoker:  yes  Health Maintenance: Pap: 10/03/14, Negative with neg HR HPV  09/03/11, Negative with neg HR HPV MMG: 10/24/15 with Right Diagnostic MMG on 11/06/15 , Bi-Rads 2:  Benign, screening in one year Colonoscopy: 12/2012, 2 benign polyps, repeat in 5 years due to family history BMD: 2013, normal TDaP: UTD Hep C: 10/14/15 neg HIV: unsure Labs: PCP takes care of all labs UA: RBC=Trace   reports that she has been smoking Cigarettes.  She has a 15.00 pack-year smoking history. She has never used smokeless tobacco. She reports that she does not drink alcohol or use drugs.  Past Medical History:  Diagnosis Date  . History of smoking   . Hx of hyperlipidemia     Past Surgical History:  Procedure Laterality Date  . APPENDECTOMY    . KNEE ARTHROSCOPY Right 2011, 2012  . MEDIAL PARTIAL KNEE REPLACEMENT  11/2010  . TONSILLECTOMY AND ADENOIDECTOMY      Current Outpatient Prescriptions  Medication Sig Dispense Refill  . calcium carbonate (OS-CAL) 600 MG TABS tablet Take 600 mg by mouth 2 (two) times daily with a meal.    . cholecalciferol (VITAMIN D) 1000 UNITS tablet Take 1,000 Units by mouth daily.    . rosuvastatin (CRESTOR) 5 MG tablet Take 5 mg by mouth daily.    . vitamin C (ASCORBIC ACID) 500 MG tablet Take 500 mg by mouth daily.     No current facility-administered medications for this visit.     Family History  Problem Relation Age of Onset  . Colon cancer Mother 9781  . Lung cancer Father 478    ROS:  Pertinent items are noted in HPI.  Otherwise, a  comprehensive ROS was negative.  Exam:   BP (!) 148/60 (BP Location: Right Arm, Patient Position: Sitting, Cuff Size: Large)   Pulse (!) 110   Resp 16   Ht 5\' 3"  (1.6 m)   Wt 168 lb (76.2 kg)   LMP 04/11/2009 (Within Days)   BMI 29.76 kg/m  Height: 5\' 3"  (160 cm) Ht Readings from Last 3 Encounters:  09/03/16 5\' 3"  (1.6 m)  10/03/14 5' 3.25" (1.607 m)  06/29/14 5\' 4"  (1.626 m)    General appearance: alert, cooperative and appears stated age Head: Normocephalic, without obvious abnormality, atraumatic Neck: no adenopathy, supple, symmetrical, trachea midline and thyroid normal to inspection and palpation Lungs: clear to auscultation bilaterally Breasts: normal appearance, no masses or tenderness Heart: regular rate and rhythm Abdomen: soft, non-tender; no masses,  no organomegaly Extremities: extremities normal, atraumatic, no cyanosis or edema Skin: Skin color, texture, turgor normal. No rashes or lesions Lymph nodes: Cervical, supraclavicular, and axillary nodes normal. No abnormal inguinal nodes palpated Neurologic: Grossly normal   Pelvic: External genitalia:  no lesions              Urethra:  normal appearing urethra with no masses, tenderness or lesions              Bartholin's and Skene's:  normal                 Vagina: normal appearing vagina with normal color and discharge, no lesions              Cervix: anteverted              Pap taken: No. Bimanual Exam:  Uterus:  normal size, contour, position, consistency, mobility, non-tender              Adnexa: no mass, fullness, tenderness               Rectovaginal: Confirms               Anus:  normal sphincter tone, no lesions  Chaperone present: yes  A:  Well Woman with normal exam  Postmenopausal no HRT S/P partial knee replacement on right 11/2010 Haematuria - asymptomatic   P:   Reviewed health and wellness pertinent to exam  Pap smear not done  Mammogram is due 10/2016  Counseled on  breast self exam, mammography screening, adequate intake of calcium and vitamin D, diet and exercise, Kegel's exercises return annually or prn  An After Visit Summary was printed and given to the patient.

## 2016-09-03 ENCOUNTER — Encounter: Payer: Self-pay | Admitting: Nurse Practitioner

## 2016-09-03 ENCOUNTER — Ambulatory Visit (INDEPENDENT_AMBULATORY_CARE_PROVIDER_SITE_OTHER): Payer: Commercial Managed Care - PPO | Admitting: Nurse Practitioner

## 2016-09-03 VITALS — BP 148/60 | HR 110 | Resp 16 | Ht 63.0 in | Wt 168.0 lb

## 2016-09-03 DIAGNOSIS — N390 Urinary tract infection, site not specified: Secondary | ICD-10-CM

## 2016-09-03 DIAGNOSIS — Z0000 Encounter for general adult medical examination without abnormal findings: Secondary | ICD-10-CM | POA: Diagnosis not present

## 2016-09-03 DIAGNOSIS — Z01419 Encounter for gynecological examination (general) (routine) without abnormal findings: Secondary | ICD-10-CM | POA: Diagnosis not present

## 2016-09-03 DIAGNOSIS — R319 Hematuria, unspecified: Secondary | ICD-10-CM

## 2016-09-03 LAB — POCT URINALYSIS DIPSTICK
Bilirubin, UA: NEGATIVE
GLUCOSE UA: NEGATIVE
Ketones, UA: NEGATIVE
Leukocytes, UA: NEGATIVE
NITRITE UA: NEGATIVE
Protein, UA: NEGATIVE
UROBILINOGEN UA: NEGATIVE
pH, UA: 5

## 2016-09-03 NOTE — Patient Instructions (Signed)

## 2016-09-04 LAB — URINALYSIS, MICROSCOPIC ONLY
Bacteria, UA: NONE SEEN [HPF]
CASTS: NONE SEEN [LPF]
CRYSTALS: NONE SEEN [HPF]
RBC / HPF: NONE SEEN RBC/HPF (ref <=2)
Yeast: NONE SEEN [HPF]

## 2016-09-05 LAB — URINE CULTURE: Organism ID, Bacteria: NO GROWTH

## 2016-09-05 NOTE — Progress Notes (Signed)
Encounter reviewed by Dr. Nehemyah Foushee Amundson C. Silva.  

## 2017-01-01 ENCOUNTER — Encounter (HOSPITAL_BASED_OUTPATIENT_CLINIC_OR_DEPARTMENT_OTHER): Payer: Self-pay | Admitting: Emergency Medicine

## 2017-01-01 ENCOUNTER — Emergency Department (HOSPITAL_BASED_OUTPATIENT_CLINIC_OR_DEPARTMENT_OTHER)
Admission: EM | Admit: 2017-01-01 | Discharge: 2017-01-01 | Disposition: A | Discharge status: Home / Self Care | Payer: 59 | Attending: Emergency Medicine | Admitting: Emergency Medicine

## 2017-01-01 ENCOUNTER — Emergency Department (HOSPITAL_BASED_OUTPATIENT_CLINIC_OR_DEPARTMENT_OTHER)
Admission: EM | Admit: 2017-01-01 | Discharge: 2017-01-01 | Disposition: A | Discharge status: Home / Self Care | Payer: 59 | Source: Home / Self Care | Attending: Emergency Medicine | Admitting: Emergency Medicine

## 2017-01-01 DIAGNOSIS — R2231 Localized swelling, mass and lump, right upper limb: Secondary | ICD-10-CM | POA: Diagnosis present

## 2017-01-01 DIAGNOSIS — Z79899 Other long term (current) drug therapy: Secondary | ICD-10-CM | POA: Diagnosis not present

## 2017-01-01 DIAGNOSIS — F17210 Nicotine dependence, cigarettes, uncomplicated: Secondary | ICD-10-CM | POA: Diagnosis not present

## 2017-01-01 DIAGNOSIS — L03011 Cellulitis of right finger: Secondary | ICD-10-CM

## 2017-01-01 DIAGNOSIS — M79644 Pain in right finger(s): Secondary | ICD-10-CM

## 2017-01-01 MED ORDER — LIDOCAINE HCL (PF) 2 % IJ SOLN
INTRAMUSCULAR | Status: AC
  Administered 2017-01-01: 5 mL
  Filled 2017-01-01: qty 2

## 2017-01-01 MED ORDER — OXYCODONE-ACETAMINOPHEN 5-325 MG PO TABS
1.0000 | ORAL_TABLET | Freq: Once | ORAL | Status: AC
  Administered 2017-01-01: 1 via ORAL
  Filled 2017-01-01: qty 1

## 2017-01-01 MED ORDER — TRAMADOL HCL 50 MG PO TABS: 50.0000 mg | ORAL_TABLET | Freq: Four times a day (QID) | ORAL | 0 refills | Status: DC | PRN

## 2017-01-01 MED ORDER — OXYCODONE-ACETAMINOPHEN 5-325 MG PO TABS: 1.0000 | ORAL_TABLET | Freq: Four times a day (QID) | ORAL | 0 refills | Status: DC | PRN

## 2017-01-01 MED ORDER — SULFAMETHOXAZOLE-TRIMETHOPRIM 800-160 MG PO TABS
1.0000 | ORAL_TABLET | Freq: Once | ORAL | Status: AC
  Administered 2017-01-01: 1 via ORAL
  Filled 2017-01-01: qty 1

## 2017-01-01 MED ORDER — SULFAMETHOXAZOLE-TRIMETHOPRIM 800-160 MG PO TABS: 1.0000 | ORAL_TABLET | Freq: Two times a day (BID) | ORAL | 0 refills | Status: DC

## 2017-01-01 NOTE — ED Triage Notes (Signed)
Pt seen early this morning for paronychia. Pt states tramadol that she was given is not working and needs something stronger for pain.

## 2017-01-01 NOTE — ED Notes (Signed)
MD at the bedside. I and D items given to MD for procedure.

## 2017-01-01 NOTE — ED Provider Notes (Signed)
[facility]   CSN: [id number] Arrival date & time: 01/01/17  1245     History   Chief Complaint Chief Complaint  Patient presents with  . Hand Pain    HPI Karen Jimenez is a 61 y.o. female.  Patient resents with complaint of worsening pain to her right thumb. Patient was seen in emergency department early this morning and had her nail resected due to an infection. She was started on antibiotics and discharged home with tramadol. When she left the emergency department, her finger was numb and she went home and went to sleep. Upon awaking, pain had returned. She tried the tramadol which did not help. She continues to complain of severe pain and reports since the emergency department for this. No new problems or other complaints.      Past Medical History:  Diagnosis Date  . History of smoking   . Hx of hyperlipidemia     Patient Active Problem List   Diagnosis Date Noted  . Knee joint replacement by other means 09/24/2013    Past Surgical History:  Procedure Laterality Date  . APPENDECTOMY    . KNEE ARTHROSCOPY Right 2011, 2012  . MEDIAL PARTIAL KNEE REPLACEMENT  11/2010  . TONSILLECTOMY AND ADENOIDECTOMY      OB History    Gravida Para Term Preterm AB Living   2 2 2  0 0 2   SAB TAB Ectopic Multiple Live Births   0 0 0 0 2       Home Medications    Prior to Admission medications   Medication Sig Start Date End Date Taking? Authorizing Provider  calcium carbonate (OS-CAL) 600 MG TABS tablet Take 600 mg by mouth 2 (two) times daily with a meal.    [provider]  cholecalciferol (VITAMIN D) 1000 UNITS tablet Take 1,000 Units by mouth daily.    [provider]  oxyCODONE-acetaminophen (PERCOCET/ROXICET) 5-325 MG tablet Take 1-2 tablets by mouth every 6 (six) hours as needed for severe pain. 01/01/17   Renne Crigler, PA-C  rosuvastatin (CRESTOR) 5 MG tablet Take 5 mg by mouth daily. 08/05/16 08/05/17  [provider]  sulfamethoxazole-trimethoprim (BACTRIM DS,SEPTRA DS) 800-160 MG tablet Take 1 tablet by mouth 2 (two) times daily. 01/01/17   Gilda Crease, MD  traMADol (ULTRAM) 50 MG tablet Take 1 tablet (50 mg total) by mouth every 6 (six) hours as needed. 01/01/17   Gilda Crease, MD  vitamin C (ASCORBIC ACID) 500 MG tablet Take 500 mg by mouth daily.    [provider]    Family History Family History  Problem Relation Age of Onset  . Colon cancer Mother 29  . Lung cancer Father 55    Social History Social History  Substance Use Topics  . Smoking status: Current Every Day Smoker    Packs/day: 0.50    Years: 30.00    Types: Cigarettes  . Smokeless tobacco: Never Used  . Alcohol use No     Allergies   Hydrocodone   Review of Systems Review of Systems  Constitutional: Negative for activity change.  Musculoskeletal: Positive for myalgias. Negative for arthralgias.  Skin: Negative for wound.  Neurological: Negative for weakness and numbness.     Physical Exam Updated Vital Signs BP (!) 148/42 (BP Location: Left Arm)   Pulse 86   Temp 98.9 F (37.2 C) (Oral)   Resp 18   LMP 04/11/2009 (Within Days)   SpO2 97%  Physical Exam  Constitutional: She appears well-developed and well-nourished.  Patient appears uncomfortable.  HENT:  Head: Normocephalic and atraumatic.  Eyes: Conjunctivae are normal.  Neck: Normal range of motion. Neck supple.  Pulmonary/Chest: No respiratory distress.  Neurological: She is alert.  Skin: Skin is warm and dry.  Postop changes noted from recent nail resection. Wound is clean without signs of infection. No drainage.  Psychiatric: She has a normal mood and affect.  Nursing note and vitals reviewed.    ED Treatments / Results  Labs (all labs ordered are listed, but only abnormal results are displayed) Labs Reviewed - No data to display  EKG  EKG Interpretation None       Radiology No results  found.  Procedures Procedures (including critical care time)  Medications Ordered in ED Medications  oxyCODONE-acetaminophen (PERCOCET/ROXICET) 5-325 MG per tablet 1 tablet (1 tablet Oral Given 01/01/17 1400)     Initial Impression / Assessment and Plan / ED Course  I have reviewed the triage vital signs and the nursing notes.  Pertinent labs & imaging results that were available during my care of the patient were reviewed by me and considered in my medical decision making (see chart for details).     Patient seen and examined. Records from visit today reviewed. Patient is having a difficult time with the pain after her procedure today. Tramadol is not controlling her pain. Will give 2 days of Percocet to use at home. [place] substance [place] database reviewed.   Patient counseled on use of narcotic pain medications. Counseled not to combine these medications with others containing tylenol. Urged not to drink alcohol, drive, or perform any other activities that requires focus while taking these medications. The patient verbalizes understanding and agrees with the plan.  Vital signs reviewed and are as follows: BP (!) 108/56 (BP Location: Left Arm)   Pulse 82   Temp 98.9 F (37.2 C) (Oral)   Resp 18   LMP 04/11/2009 (Within Days)   SpO2 99%     Final Clinical Impressions(s) / ED Diagnoses   Final diagnoses:  Thumb pain, right   Patient with post procedure pain of her thumb. Treatment as above. No apparent unexpected complication at this point.  New Prescriptions New Prescriptions   OXYCODONE-ACETAMINOPHEN (PERCOCET/ROXICET) 5-325 MG TABLET    Take 1-2 tablets by mouth every 6 (six) hours as needed for severe pain.     Renne CriglerGeiple, Demere Dotzler, PA-C 01/01/17 1409    Alvira MondaySchlossman, Erin, MD 01/05/17 1409

## 2017-01-01 NOTE — ED Triage Notes (Signed)
Patient states that she a hang nail about a week ago, now has redness to her right thumb nail. Patient has swelling up her arm

## 2017-01-01 NOTE — ED Provider Notes (Signed)
[facility]   CSN: [id number] Arrival date & time: 01/01/17  0514     History   Chief Complaint Chief Complaint  Patient presents with  . Cellulitis    HPI Karen Jimenez is a 61 y.o. female.  Patient presents to the ER for evaluation of pain and swelling of the right thumb. Patient reports that she did have a hangnail on the edge of her thumbnail approximately a week ago. Since then she has developed some redness and swelling around the edge of the thumbnail. Tonight the pain worsened, she reports a constant throbbing pain in the thumb.      Past Medical History:  Diagnosis Date  . History of smoking   . Hx of hyperlipidemia     Patient Active Problem List   Diagnosis Date Noted  . Knee joint replacement by other means 09/24/2013    Past Surgical History:  Procedure Laterality Date  . APPENDECTOMY    . KNEE ARTHROSCOPY Right 2011, 2012  . MEDIAL PARTIAL KNEE REPLACEMENT  11/2010  . TONSILLECTOMY AND ADENOIDECTOMY      OB History    Gravida Para Term Preterm AB Living   2 2 2  0 0 2   SAB TAB Ectopic Multiple Live Births   0 0 0 0 2       Home Medications    Prior to Admission medications   Medication Sig Start Date End Date Taking? Authorizing Provider  calcium carbonate (OS-CAL) 600 MG TABS tablet Take 600 mg by mouth 2 (two) times daily with a meal.    [provider]  cholecalciferol (VITAMIN D) 1000 UNITS tablet Take 1,000 Units by mouth daily.    [provider]  rosuvastatin (CRESTOR) 5 MG tablet Take 5 mg by mouth daily. 08/05/16 08/05/17  [provider]  sulfamethoxazole-trimethoprim (BACTRIM DS,SEPTRA DS) 800-160 MG tablet Take 1 tablet by mouth 2 (two) times daily. 01/01/17   Gilda CreasePollina, Christopher J, MD  traMADol (ULTRAM) 50 MG tablet Take 1 tablet (50 mg total) by mouth every 6 (six) hours as needed. 01/01/17   Gilda CreasePollina, Christopher J, MD  vitamin C (ASCORBIC ACID) 500 MG tablet Take 500 mg by  mouth daily.    [provider]    Family History Family History  Problem Relation Age of Onset  . Colon cancer Mother 3081  . Lung cancer Father 2478    Social History Social History  Substance Use Topics  . Smoking status: Current Every Day Smoker    Packs/day: 0.50    Years: 30.00    Types: Cigarettes  . Smokeless tobacco: Never Used  . Alcohol use No     Allergies   Hydrocodone   Review of Systems Review of Systems  Skin: Positive for color change and wound.  All other systems reviewed and are negative.    Physical Exam Updated Vital Signs BP (!) 142/75 (BP Location: Left Arm)   Pulse 88   Temp 98.5 F (36.9 C) (Oral)   Resp 18   Ht 5\' 4"  (1.626 m)   Wt 72.6 kg (160 lb)   LMP 04/11/2009 (Within Days)   SpO2 100%   BMI 27.46 kg/m   Physical Exam  Constitutional: She is oriented to person, place, and time. She appears well-developed.  HENT:  Head: Normocephalic and atraumatic.  Pulmonary/Chest: Effort normal.  Musculoskeletal:       Right hand: Tenderness: thumb.  Tenderness, erythema, swelling on radial aspect of right thumbnail. There  is some minor swelling and tenderness of the pad of the thumb as well, no induration or fluctuance noted  Neurological: She is alert and oriented to person, place, and time.  Skin: There is erythema (Radial side of distal aspect of right thumb).     ED Treatments / Results  Labs (all labs ordered are listed, but only abnormal results are displayed) Labs Reviewed - No data to display  EKG  EKG Interpretation None       Radiology No results found.  Procedures .Marland KitchenIncision and Drainage Date/Time: 01/01/2017 6:04 AM Performed by: Gilda Crease Authorized by: Gilda Crease   Consent:    Consent obtained:  Verbal   Consent given by:  Patient   Risks discussed:  Bleeding, incomplete drainage and pain   Alternatives discussed:  Alternative treatment Universal protocol:    Procedure  explained and questions answered to patient or proxy's satisfaction: yes     Site/side marked: yes     Immediately prior to procedure a time out was called: yes     Patient identity confirmed:  Verbally with patient Location:    Type:  Abscess   Location:  Upper extremity   Upper extremity location:  Finger   Finger location:  R thumb Pre-procedure details:    Skin preparation:  Betadine Anesthesia (see MAR for exact dosages):    Anesthesia method:  Nerve block   Block location:  Digital   Block needle gauge:  27 G   Block anesthetic:  Lidocaine 2% w/o epi   Block technique:  Landmarks identified, needle advanced 104/aspect of finger, aspiration revealed no blood return, injected with lidocaine   Block injection procedure:  Anatomic landmarks identified, introduced needle, incremental injection, negative aspiration for blood and anatomic landmarks palpated   Block outcome:  Anesthesia achieved Procedure type:    Complexity:  Simple Procedure details:    Incision types:  Stab incision   Scalpel blade:  11   Wound management:  Debrided (Corner of nail removed)   Drainage:  Purulent   Drainage amount:  Scant   Packing materials:  None Post-procedure details:    Patient tolerance of procedure:  Tolerated well, no immediate complications   (including critical care time)  Medications Ordered in ED Medications  lidocaine (XYLOCAINE) 2 % injection (not administered)  sulfamethoxazole-trimethoprim (BACTRIM DS,SEPTRA DS) 800-160 MG per tablet 1 tablet (not administered)     Initial Impression / Assessment and Plan / ED Course  I have reviewed the triage vital signs and the nursing notes.  Pertinent labs & imaging results that were available during my care of the patient were reviewed by me and considered in my medical decision making (see chart for details).     Patient presents with pain, swelling on the edge of her next to the nail. She reports that there was a hangnail and  wound in the area initially, now the area is erythematous and swollen. There is some abnormality of the nail noted in this region which the patient reports just started in the last 1 or 2 days. The edge of the nail is rolled under and slightly lifted. A small amount of pus was present under the nail. A portion of the nail was removed and the area was wrapped with sterile dressing. Will treat with Bactrim. Watch closely, return for any worsening swelling, redness or fever.  Final Clinical Impressions(s) / ED Diagnoses   Final diagnoses:  Paronychia of finger of right hand    New  Prescriptions New Prescriptions   SULFAMETHOXAZOLE-TRIMETHOPRIM (BACTRIM DS,SEPTRA DS) 800-160 MG TABLET    Take 1 tablet by mouth 2 (two) times daily.   TRAMADOL (ULTRAM) 50 MG TABLET    Take 1 tablet (50 mg total) by mouth every 6 (six) hours as needed.     Gilda Crease, MD 01/01/17 [phone]

## 2017-01-01 NOTE — ED Notes (Signed)
Coban wrap removed from right thumb, CMS intact to right wrist, no s/s of infection noted to nail bed. States feels a little better since drsg removed.

## 2017-01-01 NOTE — Discharge Instructions (Signed)
Please read and follow all provided instructions.  Your diagnoses today include:  1. Thumb pain, right    Tests performed today include:  Vital signs. See below for your results today.   Medications prescribed:   Percocet (oxycodone/acetaminophen) - narcotic pain medication  DO NOT drive or perform any activities that require you to be awake and alert because this medicine can make you drowsy. BE VERY CAREFUL not to take multiple medicines containing Tylenol (also called acetaminophen). Doing so can lead to an overdose which can damage your liver and cause liver failure and possibly death.  Take any prescribed medications only as directed.   Home care instructions:  Follow any educational materials contained in this packet. Keep affected area above the level of your heart when possible. Wash area gently twice a day with warm soapy water. Do not apply alcohol or hydrogen peroxide. Cover the area if it draining or weeping.   Follow-up instructions: Please follow-up with your primary care provider in the next 1 week for further evaluation of your symptoms.   Return instructions:  Return to the Emergency Department if you have:  Fever  Worsening symptoms  Worsening pain  Worsening swelling  Redness of the skin that moves away from the affected area, especially if it streaks away from the affected area   Any other emergent concerns  Your vital signs today were: BP (!) 148/42 (BP Location: Left Arm)    Pulse 86    Temp 98.9 F (37.2 C) (Oral)    Resp 18    LMP 04/11/2009 (Within Days)    SpO2 97%  If your blood pressure (BP) was elevated above 135/85 this visit, please have this repeated by your doctor within one month. --------------

## 2017-01-21 DIAGNOSIS — I471 Supraventricular tachycardia, unspecified: Secondary | ICD-10-CM

## 2017-01-21 DIAGNOSIS — L03019 Cellulitis of unspecified finger: Secondary | ICD-10-CM | POA: Insufficient documentation

## 2017-01-21 DIAGNOSIS — E7800 Pure hypercholesterolemia, unspecified: Secondary | ICD-10-CM | POA: Diagnosis present

## 2017-01-21 DIAGNOSIS — I214 Non-ST elevation (NSTEMI) myocardial infarction: Secondary | ICD-10-CM | POA: Insufficient documentation

## 2017-01-21 DIAGNOSIS — R7303 Prediabetes: Secondary | ICD-10-CM

## 2017-01-21 DIAGNOSIS — E559 Vitamin D deficiency, unspecified: Secondary | ICD-10-CM

## 2017-01-21 HISTORY — DX: Supraventricular tachycardia, unspecified: I47.10

## 2017-01-21 HISTORY — DX: Prediabetes: R73.03

## 2017-01-21 HISTORY — DX: Vitamin D deficiency, unspecified: E55.9

## 2017-09-07 ENCOUNTER — Ambulatory Visit: Payer: Commercial Managed Care - PPO | Admitting: Certified Nurse Midwife

## 2017-09-07 ENCOUNTER — Ambulatory Visit: Payer: Commercial Managed Care - PPO | Admitting: Nurse Practitioner

## 2017-12-29 DIAGNOSIS — F419 Anxiety disorder, unspecified: Secondary | ICD-10-CM

## 2017-12-29 HISTORY — DX: Anxiety disorder, unspecified: F41.9

## 2019-09-13 ENCOUNTER — Ambulatory Visit: Payer: Self-pay | Attending: Internal Medicine

## 2019-09-13 DIAGNOSIS — Z23 Encounter for immunization: Secondary | ICD-10-CM

## 2019-09-13 NOTE — Progress Notes (Signed)
   Covid-19 Vaccination Clinic  Name:  Karen Jimenez    MRN: [id number] DOB: [birth year]  09/13/2019  Karen Jimenez was observed post Covid-19 immunization for 15 minutes without incident. She was provided with Vaccine Information Sheet and instruction to access the V-Safe system.   Karen Jimenez was instructed to call 911 with any severe reactions post vaccine: Marland Kitchen Difficulty breathing  . Swelling of face and throat  . A fast heartbeat  . A bad rash all over body  . Dizziness and weakness   Immunizations Administered    Name Date Dose VIS Date Route   Pfizer COVID-19 Vaccine 09/13/2019 11:40 AM 0.3 mL 06/22/2019 Intramuscular   Manufacturer: [employer], [employer]   Lot: [id number]   NDC: [address]-1

## 2019-10-03 ENCOUNTER — Encounter: Payer: Self-pay | Admitting: Certified Nurse Midwife

## 2019-10-10 ENCOUNTER — Ambulatory Visit: Payer: Self-pay | Attending: Internal Medicine

## 2019-10-10 DIAGNOSIS — Z23 Encounter for immunization: Secondary | ICD-10-CM

## 2019-10-10 NOTE — Progress Notes (Signed)
   Covid-19 Vaccination Clinic  Name:  Karen Jimenez    MRN: [id number] DOB: 06-Apr-1956  10/10/2019  [name] was observed post Covid-19 immunization for 15 minutes without incident. She was provided with Vaccine Information Sheet and instruction to access the V-Safe system.   [name] was instructed to call 911 with any severe reactions post vaccine: Marland Kitchen Difficulty breathing  . Swelling of face and throat  . A fast heartbeat  . A bad rash all over body  . Dizziness and weakness   Immunizations Administered    Name Date Dose VIS Date Route   Pfizer COVID-19 Vaccine 10/10/2019 11:28 AM 0.3 mL 06/22/2019 Intramuscular   Manufacturer: [employer], [employer]   Lot: [id number]   NDC: [address]-0

## 2020-02-04 DIAGNOSIS — I160 Hypertensive urgency: Secondary | ICD-10-CM | POA: Insufficient documentation

## 2020-02-13 DIAGNOSIS — R801 Persistent proteinuria, unspecified: Secondary | ICD-10-CM | POA: Insufficient documentation

## 2020-02-13 DIAGNOSIS — E871 Hypo-osmolality and hyponatremia: Secondary | ICD-10-CM

## 2020-02-13 DIAGNOSIS — I701 Atherosclerosis of renal artery: Secondary | ICD-10-CM | POA: Insufficient documentation

## 2020-02-13 HISTORY — DX: Atherosclerosis of renal artery: I70.1

## 2020-02-13 HISTORY — DX: Hypo-osmolality and hyponatremia: E87.1

## 2020-02-25 DIAGNOSIS — I129 Hypertensive chronic kidney disease with stage 1 through stage 4 chronic kidney disease, or unspecified chronic kidney disease: Secondary | ICD-10-CM

## 2020-02-25 DIAGNOSIS — R635 Abnormal weight gain: Secondary | ICD-10-CM | POA: Insufficient documentation

## 2020-02-25 HISTORY — DX: Hypertensive chronic kidney disease with stage 1 through stage 4 chronic kidney disease, or unspecified chronic kidney disease: I12.9

## 2020-04-18 DIAGNOSIS — M545 Low back pain, unspecified: Secondary | ICD-10-CM

## 2020-04-18 HISTORY — DX: Low back pain, unspecified: M54.50

## 2020-09-08 ENCOUNTER — Other Ambulatory Visit: Payer: Self-pay | Admitting: Internal Medicine

## 2020-09-08 DIAGNOSIS — F17201 Nicotine dependence, unspecified, in remission: Secondary | ICD-10-CM

## 2020-10-21 ENCOUNTER — Telehealth: Payer: Self-pay

## 2020-10-21 NOTE — Telephone Encounter (Signed)
Referral notes sent from [facility], Phone #: [phone], Fax #: [phone]   Notes sent to scheduling 

## 2020-11-11 ENCOUNTER — Other Ambulatory Visit: Payer: Self-pay

## 2020-11-11 DIAGNOSIS — I701 Atherosclerosis of renal artery: Secondary | ICD-10-CM

## 2020-11-24 ENCOUNTER — Encounter: Payer: Self-pay | Admitting: Surgery

## 2020-11-24 ENCOUNTER — Other Ambulatory Visit: Payer: Self-pay | Admitting: *Deleted

## 2020-11-24 ENCOUNTER — Ambulatory Visit (HOSPITAL_COMMUNITY)
Admission: RE | Admit: 2020-11-24 | Discharge: 2020-11-24 | Disposition: A | Discharge status: Home / Self Care | Payer: 59 | Source: Ambulatory Visit | Attending: Surgery | Admitting: Surgery

## 2020-11-24 ENCOUNTER — Ambulatory Visit: Payer: 59 | Admitting: Surgery

## 2020-11-24 ENCOUNTER — Encounter: Payer: Self-pay | Admitting: *Deleted

## 2020-11-24 ENCOUNTER — Other Ambulatory Visit: Payer: Self-pay

## 2020-11-24 VITALS — BP 174/73 | HR 66 | Temp 98.1°F | Resp 20 | Ht 64.0 in | Wt 165.0 lb

## 2020-11-24 DIAGNOSIS — I701 Atherosclerosis of renal artery: Secondary | ICD-10-CM | POA: Diagnosis not present

## 2020-11-24 NOTE — Progress Notes (Signed)
[facility]  Patient name: Karen Jimenez MRN: [id number] DOB: Jun 06, 1956 Sex: female   REQUESTING PROVIDER:    Lisette Abu, MD   REASON FOR CONSULT:    Renal artery stenosis  HISTORY OF PRESENT ILLNESS:   Karen Jimenez is a 65 y.o. female, who is referred for evaluation of left renal artery stenosis.  Patient suffers from stage III chronic renal insufficiency, borderline diabetes, and history of NSTEMI.  She was admitted to the hospital in 2021 for a hypertensive urgency.  She was diagnosed with left renal artery stenosis which was confirmed with ultrasound and MRA.  The patient states that she is on 4 medications for hypertension and that best her blood pressure gets is a systolic of 150.  She has a history of smoking.  PAST MEDICAL HISTORY    Past Medical History:  Diagnosis Date  . History of smoking   . Hx of hyperlipidemia   . Hyperlipidemia   . Hypertension      FAMILY HISTORY   Family History  Problem Relation Age of Onset  . Colon cancer Mother 35  . Lung cancer Father 48    SOCIAL HISTORY:   Social History   Socioeconomic History  . Marital status: Married    Spouse name: Not on file  . Number of children: 2  . Years of education: Not on file  . Highest education level: Not on file  Occupational History    Employer: [employer]  Tobacco Use  . Smoking status: Former Smoker    Packs/day: 0.50    Years: 30.00    Pack years: 15.00    Types: Cigarettes  . Smokeless tobacco: Never Used  Vaping Use  . Vaping Use: Never used  Substance and Sexual Activity  . Alcohol use: No  . Drug use: No  . Sexual activity: Never    Partners: Male    Birth control/protection: Post-menopausal  Other Topics Concern  . Not on file  Social History Narrative  . Not on file   Social Determinants of Health   Financial Resource Strain: Not on file  Food Insecurity: Not on file   Transportation Needs: Not on file  Physical Activity: Not on file  Stress: Not on file  Social Connections: Not on file  Intimate Partner Violence: Not on file    ALLERGIES:    Allergies  Allergen Reactions  . Amoxicillin Hives  . Losartan     Other reaction(s): Other (See Comments) Severe AKI  . Pseudoephedrine Hcl Palpitations    Heart rate increases  . Hydralazine Nausea Only and Rash  . Hydrocodone Nausea And Vomiting    Headache  . Nitrofurantoin Other (See Comments)    Body aches    CURRENT MEDICATIONS:    Current Outpatient Medications  Medication Sig Dispense Refill  . amLODipine (NORVASC) 10 MG tablet Take 1 tablet by mouth daily.    . cloNIDine (CATAPRES) 0.1 MG tablet Take 0.1 mg by mouth 2 (two) times daily.    . hydrALAZINE (APRESOLINE) 10 MG tablet Take 10 mg by mouth 3 (three) times daily.    . metoprolol succinate (TOPROL-XL) 50 MG 24 hr tablet Take 50 mg by mouth daily.    . rosuvastatin (CRESTOR) 10 MG tablet Take 10 mg by mouth daily.    Marland Kitchen spironolactone (ALDACTONE) 50 MG tablet Take 50 mg by mouth daily.     No current facility-administered medications for this visit.    REVIEW OF  SYSTEMS:   [X]  denotes positive finding, [ ]  denotes negative finding Cardiac  Comments:  Chest pain or chest pressure:    Shortness of breath upon exertion:    Short of breath when lying flat:    Irregular heart rhythm:        Vascular    Pain in calf, thigh, or hip brought on by ambulation:    Pain in feet at night that wakes you up from your sleep:     Blood clot in your veins:    Leg swelling:         Pulmonary    Oxygen at home:    Productive cough:     Wheezing:         Neurologic    Sudden weakness in arms or legs:     Sudden numbness in arms or legs:     Sudden onset of difficulty speaking or slurred speech:    Temporary loss of vision in one eye:     Problems with dizziness:         Gastrointestinal    Blood in stool:      Vomited blood:          Genitourinary    Burning when urinating:     Blood in urine:        Psychiatric    Major depression:         Hematologic    Bleeding problems:    Problems with blood clotting too easily:        Skin    Rashes or ulcers:        Constitutional    Fever or chills:     PHYSICAL EXAM:   Vitals:   11/24/20 0901  BP: (!) 174/73  Pulse: 66  Resp: 20  Temp: 98.1 F (36.7 C)  SpO2: 97%  Weight: 165 lb (74.8 kg)  Height: 5\' 4"  (1.626 m)    GENERAL: The patient is a well-nourished female, in no acute distress. The vital signs are documented above. CARDIAC: There is a regular rate and rhythm.  VASCULAR: Palpable posterior tibial pulse bilaterally PULMONARY: Nonlabored respirations MUSCULOSKELETAL: There are no major deformities or cyanosis. NEUROLOGIC: No focal weakness or paresthesias are detected. SKIN: There are no ulcers or rashes noted. PSYCHIATRIC: The patient has a normal affect.  STUDIES:   I have reviewed the following renal artery duplex:   Right: 1-59% stenosis of the right renal artery. Upper end of range.  Left: Evidence of a > 60% stenosis in the left renal artery.    ASSESSMENT and PLAN   Left renal artery stenosis: The patient is on 4 medications for hypertension and has stage III chronic renal insufficiency.  I discussed that it would be reasonable to proceed with renal angiography to better define her stenosis and to intervene if it was hemodynamically significant.  I also discussed that there is a possibility that this does not impact her blood pressure or renal function.  I think it is reasonable to proceed.  We discussed the details of the procedure and the risks and benefits.  This will be scheduled in June.   11/26/20, MD, FACS [facility] [phone] Pager [phone]

## 2020-12-24 ENCOUNTER — Telehealth: Payer: Self-pay

## 2020-12-24 NOTE — Telephone Encounter (Signed)
Call received from pt requesting to cancel renal angiography with no plans to r/s at this time.

## 2020-12-30 ENCOUNTER — Encounter (HOSPITAL_COMMUNITY): Admission: RE | Payer: Self-pay | Source: Home / Self Care

## 2020-12-30 ENCOUNTER — Ambulatory Visit (HOSPITAL_COMMUNITY): Admission: RE | Admit: 2020-12-30 | Payer: 59 | Source: Home / Self Care | Admitting: Surgery

## 2020-12-30 SURGERY — RENAL ANGIOGRAPHY
Anesthesia: LOCAL

## 2021-01-19 ENCOUNTER — Ambulatory Visit (HOSPITAL_BASED_OUTPATIENT_CLINIC_OR_DEPARTMENT_OTHER): Payer: Self-pay | Admitting: Cardiovascular Disease

## 2021-12-28 ENCOUNTER — Other Ambulatory Visit: Payer: Self-pay | Admitting: *Deleted

## 2021-12-28 DIAGNOSIS — I701 Atherosclerosis of renal artery: Secondary | ICD-10-CM

## 2022-01-06 ENCOUNTER — Ambulatory Visit (INDEPENDENT_AMBULATORY_CARE_PROVIDER_SITE_OTHER): Payer: Medicare Other | Admitting: Vascular Surgery

## 2022-01-06 ENCOUNTER — Ambulatory Visit (HOSPITAL_COMMUNITY)
Admission: RE | Admit: 2022-01-06 | Discharge: 2022-01-06 | Disposition: A | Discharge status: Home / Self Care | Payer: Medicare Other | Source: Ambulatory Visit | Attending: Vascular Surgery | Admitting: Vascular Surgery

## 2022-01-06 VITALS — BP 135/63 | HR 60 | Temp 98.0°F | Resp 20 | Ht 64.0 in | Wt 169.4 lb

## 2022-01-06 DIAGNOSIS — I701 Atherosclerosis of renal artery: Secondary | ICD-10-CM | POA: Diagnosis present

## 2022-01-06 NOTE — Progress Notes (Signed)
Patient ID: Karen Jimenez, female   DOB: [birth year], 66 y.o.   MRN: [id number]  Reason for Consult: Follow-up (Renal Artery)   Referred by Shon Hale, *  Subjective:     HPI:  Karen Jimenez is a 66 y.o. female without previous history of vascular intervention.  She does have a history of smoking, hyperlipidemia and hypertension.  She is on 4 medications for hypertension.  She has been admitted for hypertensive crisis in the past.  She also has elevated creatinine which has been persistently elevating over the past few years currently at 2.8 per her most recent documentation in March of this year with [facility].  She has not had any recent hospital admissions for hypertension and more recently has been controlled on her for medication regimen.  She is here today with her husband and mother-in-law who is a former OR Engineer, civil (consulting) for evaluation of left renal artery stenosis and duplex was performed prior to today's visit.  She is currently without complaints other than generalized fatigue and increased appetite without any weight change.  She does not take any antiplatelet medications.  Past Medical History:  Diagnosis Date   History of smoking    Hx of hyperlipidemia    Hyperlipidemia    Hypertension    Family History  Problem Relation Age of Onset   Colon cancer Mother 67   Lung cancer Father 59   Past Surgical History:  Procedure Laterality Date   APPENDECTOMY     KNEE ARTHROSCOPY Right 2011, 2012   MEDIAL PARTIAL KNEE REPLACEMENT  11/2010   TONSILLECTOMY AND ADENOIDECTOMY      Short Social History:  Social History   Tobacco Use   Smoking status: Former    Packs/day: 0.50    Years: 30.00    Total pack years: 15.00    Types: Cigarettes   Smokeless tobacco: Never  Substance Use Topics   Alcohol use: No    Allergies  Allergen Reactions   Amoxicillin Hives   Losartan     Other reaction(s): Other (See Comments) Severe AKI   Pseudoephedrine Hcl  Palpitations    Heart rate increases   Hydralazine Nausea Only and Rash   Hydrocodone Nausea And Vomiting    Headache   Hydrocodone-Acetaminophen     Other reaction(s): nausea/vomiting   Nitrofurantoin Other (See Comments)    Body aches    Current Outpatient Medications  Medication Sig Dispense Refill   amLODipine (NORVASC) 10 MG tablet Take 5 mg by mouth daily.     cloNIDine (CATAPRES) 0.1 MG tablet Take 0.1 mg by mouth 2 (two) times daily.     metoprolol succinate (TOPROL-XL) 50 MG 24 hr tablet Take 50 mg by mouth daily.     spironolactone (ALDACTONE) 50 MG tablet Take 50 mg by mouth daily.     cholecalciferol (VITAMIN D) 25 MCG (1000 UNIT) tablet Take 1,000 Units by mouth daily. (Patient not taking: Reported on 01/06/2022)     hydrALAZINE (APRESOLINE) 10 MG tablet Take 10 mg by mouth daily. (Patient not taking: Reported on 01/06/2022)     rosuvastatin (CRESTOR) 10 MG tablet Take 10 mg by mouth at bedtime. (Patient not taking: Reported on 01/06/2022)     vitamin C (ASCORBIC ACID) 500 MG tablet Take 1,000 mg by mouth daily. (Patient not taking: Reported on 01/06/2022)     No current facility-administered medications for this visit.    Review of Systems  Constitutional: Positive for fatigue.  HENT: HENT negative.  Eyes: Eyes negative.  Respiratory: Respiratory negative.  Cardiovascular: Cardiovascular negative.  GI: Gastrointestinal negative.  Musculoskeletal: Musculoskeletal negative.  Skin: Skin negative.  Neurological: Neurological negative. Hematologic: Hematologic/lymphatic negative.  Psychiatric: Psychiatric negative.        Objective:  Objective   Vitals:   01/06/22 0843  BP: 135/63  Pulse: 60  Resp: 20  Temp: 98 F (36.7 C)  TempSrc: Temporal  SpO2: 97%  Weight: 169 lb 6.4 oz (76.8 kg)  Height: 5\' 4"  (1.626 m)   Body mass index is 29.08 kg/m.  Physical Exam HENT:     Head: Normocephalic.     Nose: Nose normal.     Mouth/Throat:     Mouth: Mucous  membranes are moist.  Eyes:     Pupils: Pupils are equal, round, and reactive to light.  Neck:     Vascular: No carotid bruit.  Cardiovascular:     Rate and Rhythm: Normal rate.     Pulses: Normal pulses.     Heart sounds: Normal heart sounds.  Pulmonary:     Effort: Pulmonary effort is normal.     Breath sounds: Normal breath sounds.  Abdominal:     General: Abdomen is flat.     Palpations: Abdomen is soft. There is no mass.     Comments: No bruits  Musculoskeletal:        General: Normal range of motion.     Right lower leg: No edema.     Left lower leg: No edema.  Skin:    General: Skin is warm and dry.     Capillary Refill: Capillary refill takes less than 2 seconds.     Findings: No lesion.  Neurological:     General: No focal deficit present.     Mental Status: She is alert.  Psychiatric:        Mood and Affect: Mood normal.        Thought Content: Thought content normal.     Data: Duplex Findings:  +----------+--------+--------+------+--------+  MesentericPSV cm/sEDV cm/sPlaqueComments  +----------+--------+--------+------+--------+  Aorta Prox  136                           +----------+--------+--------+------+--------+              +------------------+--------+--------+-------+  Right Renal ArteryPSV cm/sEDV cm/sComment  +------------------+--------+--------+-------+  Origin              183      38            +------------------+--------+--------+-------+  Proximal            161      30            +------------------+--------+--------+-------+  Mid                 145      16            +------------------+--------+--------+-------+  Distal              114      28            +------------------+--------+--------+-------+   +-----------------+--------+--------+-------+  Left Renal ArteryPSV cm/sEDV cm/sComment  +-----------------+--------+--------+-------+  Origin             156      37             +-----------------+--------+--------+-------+  Proximal           205  15            +-----------------+--------+--------+-------+  Mid                263      40            +-----------------+--------+--------+-------+  Distal             100      26            +-----------------+--------+--------+-------+   +------------+--------+--------+----+-----------+--------+--------+----+  Right KidneyPSV cm/sEDV cm/sRI  Left KidneyPSV cm/sEDV cm/sRI    +------------+--------+--------+----+-----------+--------+--------+----+  Upper Pole                      Upper Pole                       +------------+--------+--------+----+-----------+--------+--------+----+  Mid                             Mid                              +------------+--------+--------+----+-----------+--------+--------+----+  Lower Pole                      Lower Pole                       +------------+--------+--------+----+-----------+--------+--------+----+  Hilar       62      16      0.74Hilar      66      21      0.68  +------------+--------+--------+----+-----------+--------+--------+----+   +------------------+----+------------------+----+  Right Kidney          Left Kidney             +------------------+----+------------------+----+  RAR                   RAR                     +------------------+----+------------------+----+  RAR (manual)          RAR (manual)            +------------------+----+------------------+----+  Cortex                Cortex                  +------------------+----+------------------+----+  Cortex thickness      Corex thickness         +------------------+----+------------------+----+  Kidney length (cm)8.12Kidney length (cm)9.47  +------------------+----+------------------+----+      Summary:  Renal:     Right: 1-59% stenosis of the right renal artery. RRV flow present.  Left:   Evidence of a > 60% stenosis in the left renal artery. LRV         flow present.      Assessment/Plan:    66 year old female with ultrasound evidence of left renal artery stenosis with preserved kidney size with elevating creatinine and on 4 medications for hypertension with previous hypertensive urgency requiring hospital admission.  I discussed with the patient that her blood pressure and elevated creatinine may or may not be related to her renal artery stenosis but given the persistent nature of her elevated creatinine is certainly reasonable to evaluate with angiography.  We discussed the right common femoral approach with patient requiring moderate  sedation and she will certainly need this given her level of anxiety.  We will plan for this on Monday in the near future the patient would like to wait until at least August given some upcoming moving plans with her son.  This is certainly reasonable.  I have asked her to begin 81 mg of aspirin daily and likely will place her on Plavix if we intervene at the time procedure.     Maeola Harman MD [facility]

## 2022-01-15 ENCOUNTER — Other Ambulatory Visit: Payer: Self-pay

## 2022-01-15 DIAGNOSIS — I701 Atherosclerosis of renal artery: Secondary | ICD-10-CM

## 2022-02-15 ENCOUNTER — Encounter (HOSPITAL_COMMUNITY): Payer: Self-pay | Admitting: Vascular Surgery

## 2022-02-15 ENCOUNTER — Encounter (HOSPITAL_COMMUNITY)
Admission: RE | Disposition: A | Discharge status: Home / Self Care | Payer: Self-pay | Source: Home / Self Care | Attending: Vascular Surgery

## 2022-02-15 ENCOUNTER — Ambulatory Visit (HOSPITAL_COMMUNITY)
Admission: RE | Admit: 2022-02-15 | Discharge: 2022-02-15 | Disposition: A | Discharge status: Home / Self Care | Payer: Medicare Other | Attending: Vascular Surgery | Admitting: Vascular Surgery

## 2022-02-15 ENCOUNTER — Other Ambulatory Visit: Payer: Self-pay

## 2022-02-15 DIAGNOSIS — E785 Hyperlipidemia, unspecified: Secondary | ICD-10-CM | POA: Insufficient documentation

## 2022-02-15 DIAGNOSIS — I10 Essential (primary) hypertension: Secondary | ICD-10-CM | POA: Diagnosis not present

## 2022-02-15 DIAGNOSIS — I722 Aneurysm of renal artery: Secondary | ICD-10-CM

## 2022-02-15 DIAGNOSIS — Z87891 Personal history of nicotine dependence: Secondary | ICD-10-CM | POA: Diagnosis not present

## 2022-02-15 DIAGNOSIS — I701 Atherosclerosis of renal artery: Secondary | ICD-10-CM | POA: Diagnosis not present

## 2022-02-15 HISTORY — PX: PERIPHERAL VASCULAR INTERVENTION: CATH118257

## 2022-02-15 HISTORY — PX: RENAL ANGIOGRAPHY: CATH118260

## 2022-02-15 LAB — POCT I-STAT, CHEM 8
BUN: 45 mg/dL — ABNORMAL HIGH (ref 8–23)
Calcium, Ion: 1.39 mmol/L (ref 1.15–1.40)
Chloride: 105 mmol/L (ref 98–111)
Creatinine, Ser: 2.2 mg/dL — ABNORMAL HIGH (ref 0.44–1.00)
Glucose, Bld: 110 mg/dL — ABNORMAL HIGH (ref 70–99)
HCT: 37 % (ref 36.0–46.0)
Hemoglobin: 12.6 g/dL (ref 12.0–15.0)
Potassium: 5.7 mmol/L — ABNORMAL HIGH (ref 3.5–5.1)
Sodium: 133 mmol/L — ABNORMAL LOW (ref 135–145)
TCO2: 19 mmol/L — ABNORMAL LOW (ref 22–32)

## 2022-02-15 SURGERY — RENAL ANGIOGRAPHY
Anesthesia: LOCAL

## 2022-02-15 MED ORDER — MIDAZOLAM HCL 2 MG/2ML IJ SOLN
INTRAMUSCULAR | Status: DC | PRN
  Administered 2022-02-15 (×2): 1 mg via INTRAVENOUS

## 2022-02-15 MED ORDER — CLOPIDOGREL BISULFATE 75 MG PO TABS: 300.0000 mg | ORAL_TABLET | Freq: Once | ORAL | Status: DC

## 2022-02-15 MED ORDER — OXYCODONE HCL 5 MG PO TABS: 5.0000 mg | ORAL_TABLET | ORAL | Status: DC | PRN

## 2022-02-15 MED ORDER — MORPHINE SULFATE (PF) 2 MG/ML IV SOLN: 2.0000 mg | INTRAVENOUS | Status: DC | PRN

## 2022-02-15 MED ORDER — LIDOCAINE HCL (PF) 1 % IJ SOLN
INTRAMUSCULAR | Status: AC
  Filled 2022-02-15: qty 30

## 2022-02-15 MED ORDER — ROSUVASTATIN CALCIUM 10 MG PO TABS: 10.0000 mg | ORAL_TABLET | Freq: Every day | ORAL | 11 refills | Status: DC

## 2022-02-15 MED ORDER — CLOPIDOGREL BISULFATE 300 MG PO TABS
ORAL_TABLET | ORAL | Status: AC
  Filled 2022-02-15: qty 1

## 2022-02-15 MED ORDER — FENTANYL CITRATE (PF) 100 MCG/2ML IJ SOLN
INTRAMUSCULAR | Status: DC | PRN
  Administered 2022-02-15 (×2): 50 ug via INTRAVENOUS

## 2022-02-15 MED ORDER — LIDOCAINE HCL (PF) 1 % IJ SOLN
INTRAMUSCULAR | Status: DC | PRN
  Administered 2022-02-15: 10 mL via SUBCUTANEOUS

## 2022-02-15 MED ORDER — FENTANYL CITRATE (PF) 100 MCG/2ML IJ SOLN
INTRAMUSCULAR | Status: AC
  Filled 2022-02-15: qty 2

## 2022-02-15 MED ORDER — SODIUM CHLORIDE 0.9 % IV SOLN: INTRAVENOUS | Status: DC

## 2022-02-15 MED ORDER — HEPARIN SODIUM (PORCINE) 1000 UNIT/ML IJ SOLN
INTRAMUSCULAR | Status: AC
  Filled 2022-02-15: qty 10

## 2022-02-15 MED ORDER — SODIUM CHLORIDE 0.9 % IV SOLN
INTRAVENOUS | Status: DC
Start: 2022-02-15 — End: 2022-02-15

## 2022-02-15 MED ORDER — HEPARIN SODIUM (PORCINE) 1000 UNIT/ML IJ SOLN
INTRAMUSCULAR | Status: DC | PRN
  Administered 2022-02-15: 5000 [IU] via INTRAVENOUS

## 2022-02-15 MED ORDER — ONDANSETRON HCL 4 MG/2ML IJ SOLN: 4.0000 mg | Freq: Four times a day (QID) | INTRAMUSCULAR | Status: DC | PRN

## 2022-02-15 MED ORDER — HEPARIN (PORCINE) IN NACL 1000-0.9 UT/500ML-% IV SOLN
INTRAVENOUS | Status: AC
  Filled 2022-02-15: qty 1000

## 2022-02-15 MED ORDER — MIDAZOLAM HCL 2 MG/2ML IJ SOLN
INTRAMUSCULAR | Status: AC
  Filled 2022-02-15: qty 2

## 2022-02-15 MED ORDER — CLOPIDOGREL BISULFATE 75 MG PO TABS: 75.0000 mg | ORAL_TABLET | Freq: Every day | ORAL | 11 refills | Status: DC

## 2022-02-15 MED ORDER — SODIUM CHLORIDE 0.9% FLUSH: 3.0000 mL | Freq: Two times a day (BID) | INTRAVENOUS | Status: DC

## 2022-02-15 MED ORDER — SODIUM CHLORIDE 0.9% FLUSH: 3.0000 mL | INTRAVENOUS | Status: DC | PRN

## 2022-02-15 MED ORDER — HEPARIN (PORCINE) IN NACL 1000-0.9 UT/500ML-% IV SOLN
INTRAVENOUS | Status: DC | PRN
  Administered 2022-02-15 (×2): 500 mL

## 2022-02-15 MED ORDER — LABETALOL HCL 5 MG/ML IV SOLN: 10.0000 mg | INTRAVENOUS | Status: DC | PRN

## 2022-02-15 MED ORDER — CLOPIDOGREL BISULFATE 300 MG PO TABS
ORAL_TABLET | ORAL | Status: DC | PRN
  Administered 2022-02-15: 300 mg via ORAL

## 2022-02-15 MED ORDER — CLOPIDOGREL BISULFATE 75 MG PO TABS: 75.0000 mg | ORAL_TABLET | Freq: Every day | ORAL | Status: DC

## 2022-02-15 MED ORDER — ROSUVASTATIN CALCIUM 5 MG PO TABS
10.0000 mg | ORAL_TABLET | Freq: Every day | ORAL | Status: DC
  Administered 2022-02-15: 10 mg via ORAL
  Filled 2022-02-15: qty 1

## 2022-02-15 MED ORDER — SODIUM CHLORIDE 0.9 % IV SOLN: 250.0000 mL | INTRAVENOUS | Status: DC | PRN

## 2022-02-15 SURGICAL SUPPLY — 15 items
CATH OMNI FLUSH 5F 65CM (CATHETERS) ×1 IMPLANT
CLOSURE MYNX CONTROL 6F/7F (Vascular Products) ×1 IMPLANT
GUIDE CATH VISTA IMA 6F (CATHETERS) ×1 IMPLANT
KIT ANGIASSIST CO2 SYSTEM (KITS) ×1 IMPLANT
KIT ENCORE 26 ADVANTAGE (KITS) ×1 IMPLANT
KIT MICROPUNCTURE NIT STIFF (SHEATH) ×1 IMPLANT
KIT PV (KITS) ×3 IMPLANT
SHEATH PINNACLE 6F 10CM (SHEATH) ×1 IMPLANT
SHEATH PROBE COVER 6X72 (BAG) ×1 IMPLANT
STENT HERCULINK RX 5.0X15X135 (Permanent Stent) ×1 IMPLANT
STOPCOCK MORSE 400PSI 3WAY (MISCELLANEOUS) ×1 IMPLANT
TRANSDUCER W/STOPCOCK (MISCELLANEOUS) ×3 IMPLANT
TRAY PV CATH (CUSTOM PROCEDURE TRAY) ×3 IMPLANT
WIRE BENTSON .035X145CM (WIRE) ×1 IMPLANT
WIRE STABILIZER XS .014X180CM (WIRE) ×1 IMPLANT

## 2022-02-15 NOTE — H&P (Signed)
HPI:   Karen Jimenez is a 66 y.o. female without previous history of vascular intervention.  She does have a history of smoking, hyperlipidemia and hypertension.  She is on 4 medications for hypertension.  She has been admitted for hypertensive crisis in the past.  She also has elevated creatinine which has been persistently elevating over the past few years currently at 2.8 per her most recent documentation in March of this year with [facility].  She has not had any recent hospital admissions for hypertension and more recently has been controlled on her for medication regimen.  She is here today with her husband and mother-in-law who is a former OR Engineer, civil (consulting) for evaluation of left renal artery stenosis and duplex was performed prior to today's visit.  She is currently without complaints other than generalized fatigue and increased appetite without any weight change.  She does not take any antiplatelet medications.       Past Medical History:  Diagnosis Date   History of smoking     Hx of hyperlipidemia     Hyperlipidemia     Hypertension           Family History  Problem Relation Age of Onset   Colon cancer Mother 37   Lung cancer Father 21         Past Surgical History:  Procedure Laterality Date   APPENDECTOMY       KNEE ARTHROSCOPY Right 2011, 2012   MEDIAL PARTIAL KNEE REPLACEMENT   11/2010   TONSILLECTOMY AND ADENOIDECTOMY          Short Social History:  Social History         Tobacco Use   Smoking status: Former      Packs/day: 0.50      Years: 30.00      Total pack years: 15.00      Types: Cigarettes   Smokeless tobacco: Never  Substance Use Topics   Alcohol use: No           Allergies  Allergen Reactions   Amoxicillin Hives   Losartan        Other reaction(s): Other (See Comments) Severe AKI   Pseudoephedrine Hcl Palpitations      Heart rate increases   Hydralazine Nausea Only and Rash   Hydrocodone Nausea And Vomiting      Headache    Hydrocodone-Acetaminophen        Other reaction(s): nausea/vomiting   Nitrofurantoin Other (See Comments)      Body aches            Current Outpatient Medications  Medication Sig Dispense Refill   amLODipine (NORVASC) 10 MG tablet Take 5 mg by mouth daily.       cloNIDine (CATAPRES) 0.1 MG tablet Take 0.1 mg by mouth 2 (two) times daily.       metoprolol succinate (TOPROL-XL) 50 MG 24 hr tablet Take 50 mg by mouth daily.       spironolactone (ALDACTONE) 50 MG tablet Take 50 mg by mouth daily.       cholecalciferol (VITAMIN D) 25 MCG (1000 UNIT) tablet Take 1,000 Units by mouth daily. (Patient not taking: Reported on 01/06/2022)       hydrALAZINE (APRESOLINE) 10 MG tablet Take 10 mg by mouth daily. (Patient not taking: Reported on 01/06/2022)       rosuvastatin (CRESTOR) 10 MG tablet Take 10 mg by mouth at bedtime. (Patient not taking: Reported on 01/06/2022)  vitamin C (ASCORBIC ACID) 500 MG tablet Take 1,000 mg by mouth daily. (Patient not taking: Reported on 01/06/2022)        No current facility-administered medications for this visit.      Review of Systems  Constitutional: Positive for fatigue.  HENT: HENT negative.  Eyes: Eyes negative.  Respiratory: Respiratory negative.  Cardiovascular: Cardiovascular negative.  GI: Gastrointestinal negative.  Musculoskeletal: Musculoskeletal negative.  Skin: Skin negative.  Neurological: Neurological negative. Hematologic: Hematologic/lymphatic negative.  Psychiatric: Psychiatric negative.          Objective:    Vitals:   02/15/22 0904  BP: (!) 140/64  Pulse: (!) 57  Resp: 18  Temp: (!) 97.4 F (36.3 C)  SpO2: 98%      Physical Exam HENT:     Head: Normocephalic.     Nose: Nose normal.     Mouth/Throat:     Mouth: Mucous membranes are moist.  Eyes:     Pupils: Pupils are equal, round, and reactive to light.  Neck:     Vascular: No carotid bruit.  Cardiovascular:     Rate and Rhythm: Normal rate.     Pulses:  Normal pulses.     Heart sounds: Normal heart sounds.  Pulmonary:     Effort: Pulmonary effort is normal.     Breath sounds: Normal breath sounds.  Abdominal:     General: Abdomen is flat.     Palpations: Abdomen is soft. There is no mass.     Comments: No bruits  Musculoskeletal:        General: Normal range of motion.     Right lower leg: No edema.     Left lower leg: No edema.  Skin:    General: Skin is warm and dry.     Capillary Refill: Capillary refill takes less than 2 seconds.     Findings: No lesion.  Neurological:     General: No focal deficit present.     Mental Status: She is alert.  Psychiatric:        Mood and Affect: Mood normal.        Thought Content: Thought content normal.        Data: Duplex Findings:  +----------+--------+--------+------+--------+  MesentericPSV cm/sEDV cm/sPlaqueComments  +----------+--------+--------+------+--------+  Aorta Prox  136                           +----------+--------+--------+------+--------+              +------------------+--------+--------+-------+  Right Renal ArteryPSV cm/sEDV cm/sComment  +------------------+--------+--------+-------+  Origin              183      38            +------------------+--------+--------+-------+  Proximal            161      30            +------------------+--------+--------+-------+  Mid                 145      16            +------------------+--------+--------+-------+  Distal              114      28            +------------------+--------+--------+-------+   +-----------------+--------+--------+-------+  Left Renal ArteryPSV cm/sEDV cm/sComment  +-----------------+--------+--------+-------+  Origin  156      37            +-----------------+--------+--------+-------+  Proximal           205      15            +-----------------+--------+--------+-------+  Mid                263      40             +-----------------+--------+--------+-------+  Distal             100      26            +-----------------+--------+--------+-------+   +------------+--------+--------+----+-----------+--------+--------+----+  Right KidneyPSV cm/sEDV cm/sRI  Left KidneyPSV cm/sEDV cm/sRI    +------------+--------+--------+----+-----------+--------+--------+----+  Upper Pole                      Upper Pole                       +------------+--------+--------+----+-----------+--------+--------+----+  Mid                             Mid                              +------------+--------+--------+----+-----------+--------+--------+----+  Lower Pole                      Lower Pole                       +------------+--------+--------+----+-----------+--------+--------+----+  Hilar       62      16      0.74Hilar      66      21      0.68  +------------+--------+--------+----+-----------+--------+--------+----+   +------------------+----+------------------+----+  Right Kidney          Left Kidney             +------------------+----+------------------+----+  RAR                   RAR                     +------------------+----+------------------+----+  RAR (manual)          RAR (manual)            +------------------+----+------------------+----+  Cortex                Cortex                  +------------------+----+------------------+----+  Cortex thickness      Corex thickness         +------------------+----+------------------+----+  Kidney length (cm)8.12Kidney length (cm)9.47  +------------------+----+------------------+----+      Summary:  Renal:     Right: 1-59% stenosis of the right renal artery. RRV flow present.  Left:  Evidence of a > 60% stenosis in the left renal artery. LRV         flow present.       Assessment/Plan:   66 year old female with ultrasound evidence of left renal artery stenosis with  preserved kidney size with elevating creatinine and on 4 medications for hypertension with previous hypertensive urgency requiring hospital admission.  I discussed with the patient that her blood pressure and elevated creatinine may  or may not be related to her renal artery stenosis but given the persistent nature of her elevated creatinine is certainly reasonable to evaluate with angiography.  We discussed the right common femoral approach with patient requiring moderate sedation and she will certainly need this given her level of anxiety.  We will plan for this on Monday in the near future the patient would like to wait until at least August given some upcoming moving plans with her son.  This is certainly reasonable.  I have asked her to begin 81 mg of aspirin daily and likely will place her on Plavix if we intervene at the time procedure.   Dainel Arcidiacono C. Randie Heinz, MD [facility] Office: [phone] Pager: [phone]

## 2022-02-15 NOTE — Op Note (Signed)
    Patient name: Karen Jimenez MRN: [id number] DOB: Jun 06, 1956 Sex: female  02/15/2022 Pre-operative Diagnosis: Left renal artery stenosis symptomatic Post-operative diagnosis:  Same Surgeon:  Apolinar Junes C. Randie Heinz, MD Procedure Performed: 1.  Ultrasound-guided cannulation right common femoral artery 2.  CO2 and contrasted aortogram 3.  Stenting of left renal artery with 5 x 66mm Herculink 4.  Mynx device closure right common femoral artery 5.  Moderate sedation with fentanyl and Versed for 45 minutes   Indications: 66 year old female with history of symptomatic left renal artery stenosis.  She is now indicated for aortogram with possible anterior.  Findings: The aorta is patent.  Right renal artery is diminutive.  Left renal artery appears to be the dominant renal artery with at least 70% mid segment stenosis that was reduced to 0% after completion.   Procedure:  The patient was identified in the holding area and taken to room 8.  The patient was then placed supine on the table and prepped and draped in the usual sterile fashion.  A time out was called.  Ultrasound was used to evaluate the right common femoral artery.  There was a high bifurcation.  The area was anesthetized with 1% lidocaine cannulated by functional followed by wire and sheath.  Emergency department record.  Large sedation was administered with fentanyl and Versed and her vital signs were monitored throughout the case.  We then placed a Bentson wire followed by a 6 [place] sheath.  We performed CO2 aortogram follow-up contrasted aortogram.  We then cannulated the left renal artery with an IM guide catheter placed an 014 wire primarily stented with a 5 mm stent.  Completion demonstrated no residual stenosis.  We then remove the catheter and wire.  A Mynx device was deployed.  She tolerated procedure without any complication.   Contrast: 20cc  Xaine Sansom C. Randie Heinz, MD [facility] Office:  [phone] Pager: [phone]

## 2022-03-18 ENCOUNTER — Other Ambulatory Visit: Payer: Self-pay | Admitting: *Deleted

## 2022-03-18 DIAGNOSIS — I701 Atherosclerosis of renal artery: Secondary | ICD-10-CM

## 2022-03-24 ENCOUNTER — Ambulatory Visit (INDEPENDENT_AMBULATORY_CARE_PROVIDER_SITE_OTHER): Payer: Medicare Other | Admitting: Vascular Surgery

## 2022-03-24 ENCOUNTER — Ambulatory Visit (HOSPITAL_COMMUNITY)
Admission: RE | Admit: 2022-03-24 | Discharge: 2022-03-24 | Disposition: A | Discharge status: Home / Self Care | Payer: Medicare Other | Source: Ambulatory Visit | Attending: Vascular Surgery | Admitting: Vascular Surgery

## 2022-03-24 ENCOUNTER — Other Ambulatory Visit: Payer: Self-pay

## 2022-03-24 ENCOUNTER — Encounter: Payer: Self-pay | Admitting: Vascular Surgery

## 2022-03-24 VITALS — BP 115/76 | HR 61 | Temp 97.9°F | Resp 20 | Ht 64.0 in | Wt 167.9 lb

## 2022-03-24 DIAGNOSIS — I701 Atherosclerosis of renal artery: Secondary | ICD-10-CM

## 2022-03-24 NOTE — Progress Notes (Signed)
Patient ID: Karen Jimenez, female   DOB: 10-Jul-1956, 66 y.o.   MRN: [id number]  Reason for Consult: No chief complaint on file.   Referred by Shon Hale, *  Subjective:     HPI:  Karen Jimenez is a 66 y.o. female with history of symptomatic renal artery stenosis.  Recently underwent left renal artery stenting.  Since that time she states she feels much better.  She has been taking aspirin Plavix and a statin without complications other than easy bruising.  She states that since stent placement she feels much better does not have a dry mouth or dry skin and has much more energy.   Past Medical History:  Diagnosis Date   History of smoking    Hx of hyperlipidemia    Hyperlipidemia    Hypertension    Family History  Problem Relation Age of Onset   Colon cancer Mother 27   Lung cancer Father 28   Past Surgical History:  Procedure Laterality Date   APPENDECTOMY     KNEE ARTHROSCOPY Right 2011, 2012   MEDIAL PARTIAL KNEE REPLACEMENT  11/2010   PERIPHERAL VASCULAR INTERVENTION  02/15/2022   Procedure: PERIPHERAL VASCULAR INTERVENTION;  Surgeon: Maeola Harman, MD;  Location: [facility] INVASIVE CV LAB;  Service: Cardiovascular;;  LT Renal   RENAL ANGIOGRAPHY N/A 02/15/2022   Procedure: RENAL ANGIOGRAPHY;  Surgeon: Maeola Harman, MD;  Location: [facility] INVASIVE CV LAB;  Service: Cardiovascular;  Laterality: N/A;   TONSILLECTOMY AND ADENOIDECTOMY      Short Social History:  Social History   Tobacco Use   Smoking status: Former    Packs/day: 0.50    Years: 30.00    Total pack years: 15.00    Types: Cigarettes   Smokeless tobacco: Never  Substance Use Topics   Alcohol use: No    Allergies  Allergen Reactions   Amoxicillin Hives   Losartan     Other reaction(s): Other (See Comments) Severe AKI   Pseudoephedrine Hcl Palpitations    Heart rate increases   Hydralazine Nausea Only and Rash   Hydrocodone Nausea And Vomiting    Headache    Hydrocodone-Acetaminophen     Other reaction(s): nausea/vomiting   Nitrofurantoin Other (See Comments)    Body aches    Current Outpatient Medications  Medication Sig Dispense Refill   amLODipine (NORVASC) 10 MG tablet Take 10 mg by mouth daily.     aspirin EC 81 MG tablet Take 81 mg by mouth daily. Swallow whole.     cloNIDine (CATAPRES) 0.1 MG tablet Take 0.1 mg by mouth 2 (two) times daily.     clopidogrel (PLAVIX) 75 MG tablet Take 1 tablet (75 mg total) by mouth daily. 30 tablet 11   metoprolol succinate (TOPROL-XL) 50 MG 24 hr tablet Take 50 mg by mouth daily.     rosuvastatin (CRESTOR) 10 MG tablet Take 1 tablet (10 mg total) by mouth daily. 30 tablet 11   spironolactone (ALDACTONE) 50 MG tablet Take 50 mg by mouth 2 (two) times daily.     No current facility-administered medications for this visit.    Review of Systems  Constitutional:  Constitutional negative. HENT: HENT negative.  Eyes: Eyes negative.  Respiratory: Respiratory negative.  Cardiovascular: Cardiovascular negative.  GI: Gastrointestinal negative.  Musculoskeletal: Musculoskeletal negative.  Skin: Skin negative.  Neurological: Neurological negative. Hematologic: Positive for bruises/bleeds easily.  Psychiatric: Psychiatric negative.        Objective:  Objective   Vitals:  03/24/22 0934  BP: 115/76  Pulse: 61  Resp: 20  Temp: 97.9 F (36.6 C)  SpO2: 98%     Physical Exam HENT:     Head: Normocephalic.     Nose: Nose normal.     Mouth/Throat:     Mouth: Mucous membranes are moist.  Eyes:     Pupils: Pupils are equal, round, and reactive to light.  Cardiovascular:     Rate and Rhythm: Normal rate.  Pulmonary:     Effort: Pulmonary effort is normal.  Abdominal:     General: Abdomen is flat.  Musculoskeletal:     Cervical back: Normal range of motion and neck supple.  Skin:    General: Skin is warm and dry.     Capillary Refill: Capillary refill takes less than 2 seconds.   Neurological:     General: No focal deficit present.     Mental Status: She is alert.  Psychiatric:        Mood and Affect: Mood normal.        Behavior: Behavior normal.        Thought Content: Thought content normal.        Judgment: Judgment normal.     Data: Duplex Findings:   +------------------+--------+--------+-------+  Right Renal ArteryPSV cm/sEDV cm/sComment  +------------------+--------+--------+-------+  Origin              137      26            +------------------+--------+--------+-------+  Proximal             94      19            +------------------+--------+--------+-------+  Mid                  65      16            +------------------+--------+--------+-------+  Distal              111      20            +------------------+--------+--------+-------+   +-----------------+--------+--------+-------+  Left Renal ArteryPSV cm/sEDV cm/sComment  +-----------------+--------+--------+-------+  Origin             116      27            +-----------------+--------+--------+-------+  Proximal           211      44            +-----------------+--------+--------+-------+  Mid                203      44            +-----------------+--------+--------+-------+  Distal             158      33            +-----------------+--------+--------+-------+   +------------+--------+--------+----+-----------+--------+--------+----+  Right KidneyPSV cm/sEDV cm/sRI  Left KidneyPSV cm/sEDV cm/sRI    +------------+--------+--------+----+-----------+--------+--------+----+  Upper Pole  30      0       1.00Upper Pole 57      11      0.81  +------------+--------+--------+----+-----------+--------+--------+----+  Mid         29      0       1.        36      8  0.78  +------------+--------+--------+----+-----------+--------+--------+----+  Lower Pole  22      0       1.00Lower Pole 53       14      0.73  +------------+--------+--------+----+-----------+--------+--------+----+  Hilar                           Hilar                            +------------+--------+--------+----+-----------+--------+--------+----+   +------------------+-------+------------------+-------+  Right Kidney             Left Kidney                +------------------+-------+------------------+-------+  RAR                      RAR                        +------------------+-------+------------------+-------+  RAR (manual)             RAR (manual)               +------------------+-------+------------------+-------+  Cortex                   Cortex                     +------------------+-------+------------------+-------+  Cortex thickness  0.95 mmCorex thickness   0.74 mm  +------------------+-------+------------------+-------+  Kidney length (cm)7.79   Kidney length (cm)9.12     +------------------+-------+------------------+-------+      Summary:  Renal:     Right: Abnormal size for the right kidney. Abnormal right Resistive         Index. RRV flow present. 1-59% stenosis of the right renal         artery.  Left:  Normal size of left kidney. Normal left Resistive Index.         1-59% stenosis of the left renal artery. LRV flow present.      Assessment/Plan:    66 year old female status post left renal artery stenting without residual stenosis doing well since time of stent placement.  She will continue Plavix for 2 more months and after that will be on aspirin and a statin.  I will follow her up in 6 months at which time we can follow her yearly if her duplex is satisfactory.  She demonstrates good understanding of the plan     Maeola Harman MD [facility]

## 2022-03-26 ENCOUNTER — Other Ambulatory Visit: Payer: Self-pay

## 2022-05-31 ENCOUNTER — Telehealth: Payer: Self-pay

## 2022-05-31 NOTE — Telephone Encounter (Signed)
Pt called stating that she thought she was only supposed to be on Plavix and Rosuvastatin for a total of 3 months following her renal stent, but her pharmacy called her and she wanted to clarify.  Reviewed pt's chart, returned call for clarification, two identifiers used. Reviewed last office note with pt which stated to stop Plavix after 3 months, but continue ASA and Rosuvastatin. Pt will refill now. Confirmed understanding.

## 2022-06-27 ENCOUNTER — Encounter (HOSPITAL_BASED_OUTPATIENT_CLINIC_OR_DEPARTMENT_OTHER): Payer: Self-pay | Admitting: Emergency Medicine

## 2022-06-27 ENCOUNTER — Observation Stay (HOSPITAL_BASED_OUTPATIENT_CLINIC_OR_DEPARTMENT_OTHER)
Admission: EM | Admit: 2022-06-27 | Discharge: 2022-06-28 | Disposition: A | Discharge status: Home / Self Care | Payer: Medicare Other | Attending: Emergency Medicine | Admitting: Emergency Medicine

## 2022-06-27 ENCOUNTER — Emergency Department (HOSPITAL_BASED_OUTPATIENT_CLINIC_OR_DEPARTMENT_OTHER): Payer: Medicare Other

## 2022-06-27 ENCOUNTER — Encounter (HOSPITAL_COMMUNITY): Payer: Self-pay

## 2022-06-27 ENCOUNTER — Other Ambulatory Visit: Payer: Self-pay

## 2022-06-27 DIAGNOSIS — Z7982 Long term (current) use of aspirin: Secondary | ICD-10-CM | POA: Insufficient documentation

## 2022-06-27 DIAGNOSIS — E7800 Pure hypercholesterolemia, unspecified: Secondary | ICD-10-CM | POA: Insufficient documentation

## 2022-06-27 DIAGNOSIS — Z79899 Other long term (current) drug therapy: Secondary | ICD-10-CM | POA: Diagnosis not present

## 2022-06-27 DIAGNOSIS — E875 Hyperkalemia: Secondary | ICD-10-CM | POA: Diagnosis not present

## 2022-06-27 DIAGNOSIS — K5792 Diverticulitis of intestine, part unspecified, without perforation or abscess without bleeding: Secondary | ICD-10-CM | POA: Diagnosis not present

## 2022-06-27 DIAGNOSIS — N1832 Chronic kidney disease, stage 3b: Secondary | ICD-10-CM | POA: Diagnosis not present

## 2022-06-27 DIAGNOSIS — Z1152 Encounter for screening for COVID-19: Secondary | ICD-10-CM | POA: Diagnosis not present

## 2022-06-27 DIAGNOSIS — Z87891 Personal history of nicotine dependence: Secondary | ICD-10-CM | POA: Diagnosis not present

## 2022-06-27 DIAGNOSIS — R0602 Shortness of breath: Secondary | ICD-10-CM | POA: Diagnosis not present

## 2022-06-27 DIAGNOSIS — N179 Acute kidney failure, unspecified: Secondary | ICD-10-CM | POA: Diagnosis not present

## 2022-06-27 DIAGNOSIS — I129 Hypertensive chronic kidney disease with stage 1 through stage 4 chronic kidney disease, or unspecified chronic kidney disease: Secondary | ICD-10-CM | POA: Diagnosis not present

## 2022-06-27 DIAGNOSIS — R109 Unspecified abdominal pain: Secondary | ICD-10-CM | POA: Diagnosis present

## 2022-06-27 DIAGNOSIS — Z7902 Long term (current) use of antithrombotics/antiplatelets: Secondary | ICD-10-CM | POA: Diagnosis not present

## 2022-06-27 DIAGNOSIS — E871 Hypo-osmolality and hyponatremia: Secondary | ICD-10-CM

## 2022-06-27 DIAGNOSIS — I2510 Atherosclerotic heart disease of native coronary artery without angina pectoris: Secondary | ICD-10-CM | POA: Diagnosis not present

## 2022-06-27 DIAGNOSIS — Z96651 Presence of right artificial knee joint: Secondary | ICD-10-CM | POA: Diagnosis not present

## 2022-06-27 DIAGNOSIS — R112 Nausea with vomiting, unspecified: Secondary | ICD-10-CM

## 2022-06-27 DIAGNOSIS — R7303 Prediabetes: Secondary | ICD-10-CM | POA: Diagnosis present

## 2022-06-27 DIAGNOSIS — N289 Disorder of kidney and ureter, unspecified: Principal | ICD-10-CM

## 2022-06-27 DIAGNOSIS — I471 Supraventricular tachycardia, unspecified: Secondary | ICD-10-CM | POA: Diagnosis present

## 2022-06-27 DIAGNOSIS — I10 Essential (primary) hypertension: Secondary | ICD-10-CM | POA: Diagnosis present

## 2022-06-27 LAB — URINALYSIS, ROUTINE W REFLEX MICROSCOPIC
Bilirubin Urine: NEGATIVE
Glucose, UA: NEGATIVE mg/dL
Ketones, ur: NEGATIVE mg/dL
Leukocytes,Ua: NEGATIVE
Nitrite: NEGATIVE
Protein, ur: 30 mg/dL — AB
Specific Gravity, Urine: 1.015 (ref 1.005–1.030)
pH: 5.5 (ref 5.0–8.0)

## 2022-06-27 LAB — RESP PANEL BY RT-PCR (RSV, FLU A&B, COVID)  RVPGX2
Influenza A by PCR: NEGATIVE
Influenza B by PCR: NEGATIVE
Resp Syncytial Virus by PCR: NEGATIVE
SARS Coronavirus 2 by RT PCR: NEGATIVE

## 2022-06-27 LAB — COMPREHENSIVE METABOLIC PANEL
ALT: 25 U/L (ref 0–44)
AST: 28 U/L (ref 15–41)
Albumin: 4 g/dL (ref 3.5–5.0)
Alkaline Phosphatase: 73 U/L (ref 38–126)
Anion gap: 13 (ref 5–15)
BUN: 57 mg/dL — ABNORMAL HIGH (ref 8–23)
CO2: 15 mmol/L — ABNORMAL LOW (ref 22–32)
Calcium: 9.4 mg/dL (ref 8.9–10.3)
Chloride: 101 mmol/L (ref 98–111)
Creatinine, Ser: 2.82 mg/dL — ABNORMAL HIGH (ref 0.44–1.00)
GFR, Estimated: 18 mL/min — ABNORMAL LOW (ref >60)
Glucose, Bld: 171 mg/dL — ABNORMAL HIGH (ref 70–99)
Potassium: 5.6 mmol/L — ABNORMAL HIGH (ref 3.5–5.1)
Sodium: 129 mmol/L — ABNORMAL LOW (ref 135–145)
Total Bilirubin: 0.4 mg/dL (ref 0.3–1.2)
Total Protein: 8.2 g/dL — ABNORMAL HIGH (ref 6.5–8.1)

## 2022-06-27 LAB — CBC
HCT: 37 % (ref 36.0–46.0)
Hemoglobin: 12.7 g/dL (ref 12.0–15.0)
MCH: 29.2 pg (ref 26.0–34.0)
MCHC: 34.3 g/dL (ref 30.0–36.0)
MCV: 85.1 fL (ref 80.0–100.0)
Platelets: 356 10*3/uL (ref 150–400)
RBC: 4.35 MIL/uL (ref 3.87–5.11)
RDW: 13.6 % (ref 11.5–15.5)
WBC: 20 10*3/uL — ABNORMAL HIGH (ref 4.0–10.5)
nRBC: 0 % (ref 0.0–0.2)

## 2022-06-27 LAB — URINALYSIS, MICROSCOPIC (REFLEX)

## 2022-06-27 LAB — LIPASE, BLOOD: Lipase: 46 U/L (ref 11–51)

## 2022-06-27 MED ORDER — LACTATED RINGERS IV SOLN: INTRAVENOUS | Status: DC

## 2022-06-27 MED ORDER — ONDANSETRON HCL 4 MG/2ML IJ SOLN
4.0000 mg | Freq: Once | INTRAMUSCULAR | Status: AC
  Administered 2022-06-27: 4 mg via INTRAVENOUS

## 2022-06-27 MED ORDER — METRONIDAZOLE 500 MG/100ML IV SOLN
500.0000 mg | Freq: Once | INTRAVENOUS | Status: AC
  Administered 2022-06-27: 500 mg via INTRAVENOUS
  Filled 2022-06-27: qty 100

## 2022-06-27 MED ORDER — PROCHLORPERAZINE EDISYLATE 10 MG/2ML IJ SOLN
10.0000 mg | Freq: Once | INTRAMUSCULAR | Status: AC
  Administered 2022-06-27: 10 mg via INTRAVENOUS
  Filled 2022-06-27: qty 2

## 2022-06-27 MED ORDER — ROSUVASTATIN CALCIUM 10 MG PO TABS
10.0000 mg | ORAL_TABLET | Freq: Every day | ORAL | Status: DC
  Administered 2022-06-27 – 2022-06-28 (×2): 10 mg via ORAL
  Filled 2022-06-27 (×2): qty 1

## 2022-06-27 MED ORDER — METRONIDAZOLE 500 MG/100ML IV SOLN
500.0000 mg | Freq: Two times a day (BID) | INTRAVENOUS | Status: DC
  Administered 2022-06-27 – 2022-06-28 (×2): 500 mg via INTRAVENOUS
  Filled 2022-06-27 (×2): qty 100

## 2022-06-27 MED ORDER — ONDANSETRON HCL 4 MG PO TABS: 4.0000 mg | ORAL_TABLET | Freq: Four times a day (QID) | ORAL | Status: DC | PRN

## 2022-06-27 MED ORDER — SODIUM CHLORIDE 0.9 % IV SOLN: INTRAVENOUS | Status: DC

## 2022-06-27 MED ORDER — ONDANSETRON HCL 4 MG/2ML IJ SOLN: 4.0000 mg | Freq: Four times a day (QID) | INTRAMUSCULAR | Status: DC | PRN

## 2022-06-27 MED ORDER — LACTATED RINGERS IV BOLUS
2000.0000 mL | Freq: Once | INTRAVENOUS | Status: AC
  Administered 2022-06-27: 1000 mL via INTRAVENOUS

## 2022-06-27 MED ORDER — AMLODIPINE BESYLATE 10 MG PO TABS
10.0000 mg | ORAL_TABLET | Freq: Every day | ORAL | Status: DC
  Administered 2022-06-27 – 2022-06-28 (×2): 10 mg via ORAL
  Filled 2022-06-27 (×2): qty 1

## 2022-06-27 MED ORDER — ACETAMINOPHEN 650 MG RE SUPP: 650.0000 mg | Freq: Four times a day (QID) | RECTAL | Status: DC | PRN

## 2022-06-27 MED ORDER — FENTANYL CITRATE PF 50 MCG/ML IJ SOSY
50.0000 ug | PREFILLED_SYRINGE | Freq: Once | INTRAMUSCULAR | Status: AC
  Administered 2022-06-27: 50 ug via INTRAVENOUS
  Filled 2022-06-27: qty 1

## 2022-06-27 MED ORDER — ONDANSETRON HCL 4 MG/2ML IJ SOLN
4.0000 mg | Freq: Once | INTRAMUSCULAR | Status: AC
  Administered 2022-06-27: 4 mg via INTRAVENOUS
  Filled 2022-06-27: qty 2

## 2022-06-27 MED ORDER — FENTANYL CITRATE PF 50 MCG/ML IJ SOSY: 50.0000 ug | PREFILLED_SYRINGE | INTRAMUSCULAR | Status: DC | PRN

## 2022-06-27 MED ORDER — METOPROLOL SUCCINATE ER 50 MG PO TB24
50.0000 mg | ORAL_TABLET | Freq: Every day | ORAL | Status: DC
  Administered 2022-06-27: 50 mg via ORAL
  Filled 2022-06-27: qty 1

## 2022-06-27 MED ORDER — ASPIRIN 81 MG PO TBEC
81.0000 mg | DELAYED_RELEASE_TABLET | Freq: Every day | ORAL | Status: DC
  Administered 2022-06-28: 81 mg via ORAL
  Filled 2022-06-27: qty 1

## 2022-06-27 MED ORDER — ONDANSETRON 4 MG PO TBDP
4.0000 mg | ORAL_TABLET | Freq: Once | ORAL | Status: AC | PRN
Start: 2022-06-27 — End: 2022-06-27
  Administered 2022-06-27: 4 mg via ORAL
  Filled 2022-06-27: qty 1

## 2022-06-27 MED ORDER — ACETAMINOPHEN 325 MG PO TABS: 650.0000 mg | ORAL_TABLET | Freq: Four times a day (QID) | ORAL | Status: DC | PRN

## 2022-06-27 MED ORDER — SODIUM CHLORIDE 0.9 % IV SOLN
2.0000 g | Freq: Once | INTRAVENOUS | Status: AC
Start: 2022-06-27 — End: 2022-06-27
  Administered 2022-06-27: 2 g via INTRAVENOUS
  Filled 2022-06-27: qty 12.5

## 2022-06-27 MED ORDER — ONDANSETRON HCL 4 MG/2ML IJ SOLN
INTRAMUSCULAR | Status: AC
  Filled 2022-06-27: qty 2

## 2022-06-27 MED ORDER — SODIUM CHLORIDE 0.9 % IV SOLN
2.0000 g | INTRAVENOUS | Status: DC
  Administered 2022-06-28: 2 g via INTRAVENOUS
  Filled 2022-06-27: qty 20

## 2022-06-27 NOTE — Plan of Care (Signed)
  Problem: Coping: Goal: Level of anxiety will decrease Outcome: Progressing   Problem: Pain Managment: Goal: General experience of comfort will improve Outcome: Progressing   

## 2022-06-27 NOTE — Progress Notes (Signed)
Plan of Care Note for accepted transfer   Patient: Karen Jimenez MRN: [id number]   [place]: 06/27/2022  Facility requesting transfer: [facility].  Requesting Provider: Nanda Quinton, MD. Reason for transfer: Acute diverticulitis. Facility course:   66 year old female with past medical history of CAD, history NSTEMI, SVT, hyperlipidemia, vitamin D deficiency left renal artery stenosis, hypertension, hypertensive emergency, hyponatremia, anxiety who presented to [facility] this morning with complaints of abdominal pain, nausea and emesis since yesterday evening.  She was given antiemetics, analgesics, cefepime and metronidazole  Lab work: Lipase, blood [id number]   Collected: 06/27/22 0515   Updated: 06/27/22 0716   Specimen Type: Blood    Lipase 46 U/L  Resp panel by RT-PCR (RSV, Flu A&B, Covid) Anterior Nasal Swab [id number]   Collected: 06/27/22 0515   Updated: 06/27/22 0602   Specimen Source: Anterior Nasal Swab    SARS Coronavirus 2 by RT PCR NEGATIVE   Influenza A by PCR NEGATIVE   Influenza B by PCR NEGATIVE   Resp Syncytial Virus by PCR NEGATIVE  Comprehensive metabolic panel [id number] (Abnormal)   Collected: 06/27/22 0515   Updated: 06/27/22 0542   Specimen Type: Blood   Specimen Source: Vein    Sodium 129 Low  mmol/L   Potassium 5.6 High  mmol/L   Chloride 101 mmol/L   CO2 15 Low  mmol/L   Glucose, Bld 171 High  mg/dL   BUN 57 High  mg/dL   Creatinine, Ser 2.82 High  mg/dL   Calcium 9.4 mg/dL   Total Protein 8.2 High  g/dL   Albumin 4.0 g/dL   AST 28 U/L   ALT 25 U/L   Alkaline Phosphatase 73 U/L   Total Bilirubin 0.4 mg/dL   GFR, Estimated 18 Low  mL/min   Anion gap 13  CBC [id number] (Abnormal)   Collected: 06/27/22 0515   Updated: 06/27/22 0524   Specimen Type: Blood   Specimen Source: Vein    WBC 20.0 High  K/uL   RBC 4.35 MIL/uL   Hemoglobin 12.7 g/dL   HCT 37.0 %   MCV 85.1 fL   MCH 29.2 pg   MCHC 34.3 g/dL   RDW 13.6 %    Platelets 356 K/uL   nRBC 0.0 %   Imaging: CLINICAL DATA:  66 year old female with history of abdominal and flank pain. Suspected kidney stone. Nausea, vomiting, diarrhea and abdominal pain since yesterday evening.   EXAM: CT ABDOMEN AND PELVIS WITHOUT CONTRAST   TECHNIQUE: Multidetector CT imaging of the abdomen and pelvis was performed following the standard protocol without IV contrast.   RADIATION DOSE REDUCTION: This exam was performed according to the departmental dose-optimization program which includes automated exposure control, adjustment of the mA and/or kV according to patient size and/or use of iterative reconstruction technique.   COMPARISON:  CT of the abdomen and pelvis 10/07/2016.   FINDINGS: Lower chest: Atherosclerotic calcifications in the descending thoracic aorta. Small hiatal hernia.   Hepatobiliary: No definite suspicious cystic or solid hepatic lesions confidently identified on today's noncontrast CT examination. Unenhanced appearance of the gallbladder is unremarkable.   Pancreas: No definite pancreatic mass or peripancreatic fluid collections or inflammatory changes are noted on today's noncontrast CT examination.   Spleen: Unremarkable.   Adrenals/Urinary Tract: There are no abnormal calcifications within the collecting system of either kidney, along the course of either ureter, or within the lumen of the urinary bladder. No hydroureteronephrosis or perinephric stranding to suggest urinary tract obstruction at  this time. Mild multifocal cortical thinning in the right kidney, similar to the prior examination. Unenhanced appearance of the left kidney and bilateral adrenal glands is otherwise unremarkable. Urinary bladder is normal in appearance.   Stomach/Bowel: Unenhanced appearance of the stomach is normal. There is no pathologic dilatation of small bowel or colon. 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 No diverticular abscess or signs of frank perforation. The appendix is not confidently identified and may be surgically absent. Regardless, there are no inflammatory changes noted adjacent to the cecum to suggest the presence of an acute appendicitis at this time.   Vascular/Lymphatic: Atherosclerotic calcifications throughout the abdominal aorta and pelvic vasculature. No lymphadenopathy noted in the abdomen or pelvis.   Reproductive: Unenhanced appearance of the uterus and ovaries is unremarkable.   Other: No significant volume of ascites.  No pneumoperitoneum.   Musculoskeletal: There are no aggressive appearing lytic or blastic lesions noted in the visualized portions of the skeleton.   IMPRESSION: 1. Mild inflammatory changes adjacent to the sigmoid colon in a region of extensive diverticular disease suggestive of very mild or early acute diverticulitis. No diverticular abscess or signs of frank perforation are noted at this time. 2. No urinary tract calculi or findings of urinary tract obstruction. 3. Mild multifocal cortical thinning in the right kidney. 4. Aortic atherosclerosis. 5. Small hiatal hernia.    Electronically Signed   By: Trudie Reed M.D.   On: 06/27/2022 07:11   Plan of care: The patient is accepted for admission to Med-surg  unit, at [facility]..   Author: Bobette Mo, MD 06/27/2022  Check [email or web address] for on-call coverage.  Nursing staff, Please call [facility] Admits & Consults System-Wide number on Amion as soon as patient's arrival, so appropriate admitting provider can evaluate the pt.

## 2022-06-27 NOTE — Plan of Care (Signed)
  Problem: Education: Goal: Knowledge of General Education information will improve Description: Including pain rating scale, medication(s)/side effects and non-pharmacologic comfort measures Outcome: Progressing   Problem: Health Behavior/Discharge Planning: Goal: Ability to manage health-related needs will improve Outcome: Progressing   Problem: Clinical Measurements: Goal: Ability to maintain clinical measurements within normal limits will improve Outcome: Progressing   Problem: Elimination: Goal: Will not experience complications related to bowel motility Outcome: Progressing   Problem: Pain Managment: Goal: General experience of comfort will improve Outcome: Progressing   Problem: Safety: Goal: Ability to remain free from injury will improve Outcome: Progressing   Problem: Skin Integrity: Goal: Risk for impaired skin integrity will decrease Outcome: Progressing   

## 2022-06-27 NOTE — H&P (Signed)
History and Physical    Patient: Karen Jimenez YYF:[id number] DOB: [birth year] DOA: 06/27/2022 DOS: the patient was seen and examined on 06/27/2022 PCP: Shon Hale, MD  Patient coming from: Home  Chief Complaint:  Chief Complaint  Patient presents with   Abdominal Pain   HPI: Karen Jimenez is a 66 y.o. female with medical history significant of anxiety, hypertension, stage III CKD, history of tobacco smoking, hyperlipidemia, hypertension, hyponatremia, left renal artery stenosis, history of lower back pain, prediabetes, SVT, vitamin D deficiency who presented to the emergency department due to abdominal pain, nausea, vomiting and diarrhea since yesterday evening 2 hours after having dinner.  No constipation, melena or hematochezia.  No flank pain, dysuria, frequency or hematuria.  She denied fever, chills, rhinorrhea, sore throat, wheezing or hemoptysis.  No chest pain, palpitations, diaphoresis, PND, orthopnea or pitting edema of the lower extremities.  No polyuria, polydipsia, polyphagia or blurred vision.   ED course: Initial vital signs were temperature 97.8 F, pulse 88, respiration 18, BP 129/64 mmHg O2 sat 98% on room air.  The patient received ondansetron 4 mg IVP x 2, ondansetron 4 mg disintegrating tablet fentanyl 50 mcg IVP, cefepime 2 g IVPB and metronidazole 500 mg IVPB.  After arrival to this facility, I ordered 2000 mL bolus of LR and prochlorperazine 10 mg IVP.  Lab work: Urinalysis with small hemoglobinuria and proteinuria 30 mg deciliter with a few bacteria microscopic examination.  CBC showed a white count 20.0, hemoglobin 12.7 g/dL platelets 567.  Coronavirus, influenza and RSV PCR negative.  CMP with a sodium 129, potassium 5.6, chloride 101 and CO2 15 mmol/L with an anion gap of 13.  Glucose 171, BUN 57 and creatinine 2.82 mg/dL.  LFTs were normal SF4 and total protein of 8.2 g/dL.  Lipase was only 46 units/L.  Imaging: CT renal study showing mild  inflammatory changes adjacent to the sigmoid colon in the region extensive diverticular disease suggestive of very mild or early acute diverticulitis.  No abscess or perforation signs.  There is aortic atherosclerosis mild multifocal cortical thinning in the right kidney.  No renal lithiasis.  Small hiatal hernia.  Review of Systems: As mentioned in the history of present illness. All other systems reviewed and are negative. Past Medical History:  Diagnosis Date   Anxiety 12/29/2017   Benign hypertension with chronic kidney disease, stage III (HCC) 02/25/2020   History of smoking    Hx of hyperlipidemia    Hyperlipidemia    Hypertension    Hyponatremia 02/13/2020   Left renal artery stenosis (HCC) 02/13/2020   Low back pain 04/18/2020   Prediabetes 01/21/2017   SVT (supraventricular tachycardia) 01/21/2017   Vitamin D deficiency disease 01/21/2017   Past Surgical History:  Procedure Laterality Date   APPENDECTOMY     KNEE ARTHROSCOPY Right 2011, 2012   MEDIAL PARTIAL KNEE REPLACEMENT  11/2010   PERIPHERAL VASCULAR INTERVENTION  02/15/2022   Procedure: PERIPHERAL VASCULAR INTERVENTION;  Surgeon: Maeola Harman, MD;  [facility] INVASIVE CV LAB;  Service: Cardiovascular;;  LT Renal   RENAL ANGIOGRAPHY N/A 02/15/2022   Procedure: RENAL ANGIOGRAPHY;  Surgeon: Maeola Harman, MD;  Location: [facility] INVASIVE CV LAB;  Service: Cardiovascular;  Laterality: N/A;   TONSILLECTOMY AND ADENOIDECTOMY     Social History:  reports that she has quit smoking. Her smoking use included cigarettes. She has a 15.00 pack-year smoking history. She has never used smokeless tobacco. She reports that she does not drink alcohol and does  not use drugs.  Allergies  Allergen Reactions   Amoxicillin Hives   Losartan     Other reaction(s): Other (See Comments) Severe AKI   Pseudoephedrine Hcl Palpitations    Heart rate increases   Hydralazine Nausea Only and Rash   Hydrocodone Nausea And Vomiting     Headache   Hydrocodone-Acetaminophen     Other reaction(s): nausea/vomiting   Nitrofurantoin Other (See Comments)    Body aches    Family History  Problem Relation Age of Onset   Colon cancer Mother 86   Lung cancer Father 91    Prior to Admission medications   Medication Sig Start Date End Date Taking? Authorizing Provider  amLODipine (NORVASC) 10 MG tablet Take 10 mg by mouth daily. 10/20/20   [provider]  aspirin EC 81 MG tablet Take 81 mg by mouth daily. Swallow whole.    [provider]  cloNIDine (CATAPRES) 0.1 MG tablet Take 0.1 mg by mouth daily. 10/17/20   [provider]  clopidogrel (PLAVIX) 75 MG tablet Take 1 tablet (75 mg total) by mouth daily. 02/15/22 02/15/23  Maeola Harman, MD  metoprolol succinate (TOPROL-XL) 50 MG 24 hr tablet Take 50 mg by mouth daily. 09/05/20   [provider]  rosuvastatin (CRESTOR) 10 MG tablet Take 1 tablet (10 mg total) by mouth daily. 02/15/22 02/15/23  Maeola Harman, MD  spironolactone (ALDACTONE) 50 MG tablet Take 50 mg by mouth 2 (two) times daily. 09/05/20   [provider]    Physical Exam: Vitals:   06/27/22 0615 06/27/22 0700 06/27/22 0854 06/27/22 1013  BP: (!) 145/66 (!) 146/68  (!) 169/82  Pulse: 81 77  91  Resp: 14 10  17   Temp:   97.9 F (36.6 C) 97.9 F (36.6 C)  TempSrc:   Oral Oral  SpO2: 97% 97%  99%  Weight:      Height:       Physical Exam Vitals and nursing note reviewed.  Constitutional:      General: She is awake. She is not in acute distress.    Appearance: She is well-developed and overweight.  HENT:     Head: Normocephalic.     Nose: No rhinorrhea.     Mouth/Throat:     Mouth: Mucous membranes are dry.  Eyes:     General: No scleral icterus.    Pupils: Pupils are equal, round, and reactive to light.  Neck:     Vascular: No JVD.  Cardiovascular:     Rate and Rhythm: Normal rate and regular rhythm.  Pulmonary:     Effort: Pulmonary  effort is normal.     Breath sounds: Normal breath sounds.  Abdominal:     General: Bowel sounds are normal.     Palpations: Abdomen is soft.     Tenderness: There is abdominal tenderness in the epigastric area. There is no right CVA tenderness, left CVA tenderness, guarding or rebound.  Musculoskeletal:     Cervical back: Neck supple.     Right lower leg: No edema.     Left lower leg: No edema.  Skin:    General: Skin is warm and dry.  Neurological:     General: No focal deficit present.     Mental Status: She is alert and oriented to person, place, and time.  Psychiatric:        Mood and Affect: Mood normal.        Behavior: Behavior is cooperative.  Data Reviewed:  There are no new results to review at this time.  Assessment and Plan: Principal Problem:   Acute diverticulitis Admit to MedSurg/inpatient. Continue IV fluids. Ceftriaxone 2 g IVPB every 24 hours..   Continue metronidazole 500 mg IVPB q 12 hr. Analgesics as needed. Antiemetics as needed Advance to clear liquid diet as tolerated. Follow CBC and CMP in a.m.  Active Problems:   AKI (acute kidney injury) (HCC) Superimposed on stage 3b CKD. Continue IV fluids. Hold diuretic. Avoid hypotension. Avoid nephrotoxins. Monitor intake and output. Monitor renal function electrolytes.    Hyperkalemia Hold spironolactone. Continue IV fluids. Follow potassium level.    Prediabetes Monitor CBG before meals and bedtime.    Pure hypercholesterolemia Continue rosuvastatin 10 mg p.o. daily.    SVT (supraventricular tachycardia) Continue metoprolol succinate 50 mg p.o. daily.    Hypertension Hold spironolactone. Continue metoprolol as above. Continue amlodipine 10 mg p.o. daily.    CAD (coronary artery disease) Continue beta-blocker, CCB, aspirin and statin.     Advance Care Planning:   Code Status: Full Code   Consults:   Family Communication:   Severity of Illness: The appropriate patient  status for this patient is INPATIENT. Inpatient status is judged to be reasonable and necessary in order to provide the required intensity of service to ensure the patient's safety. The patient's presenting symptoms, physical exam findings, and initial radiographic and laboratory data in the context of their chronic comorbidities is felt to place them at high risk for further clinical deterioration. Furthermore, it is not anticipated that the patient will be medically stable for discharge from the hospital within 2 midnights of admission.   * I certify that at the point of admission it is my clinical judgment that the patient will require inpatient hospital care spanning beyond 2 midnights from the point of admission due to high intensity of service, high risk for further deterioration and high frequency of surveillance required.*  Author: Bobette Mo, MD 06/27/2022 10:21 AM  For on call review [email or web address].   This document was prepared using Dragon voice recognition software and may contain some unintended transcription errors.

## 2022-06-27 NOTE — ED Provider Notes (Signed)
[facility] EMERGENCY DEPARTMENT Provider Note   CSN: [id number] Arrival date & time: 06/27/22  9373     History  Chief Complaint  Patient presents with   Abdominal Pain    Karen Jimenez is a 66 y.o. female.  The history is provided by the patient.  Abdominal Pain Pain location:  Generalized Pain radiates to:  Does not radiate Pain severity:  Severe Onset quality:  Sudden Duration: hours. Timing:  Constant Progression:  Unchanged Chronicity:  New Context: eating   Context comment:  Ate out this evening at [facility] cafe and had chicken and dumplings, no one else ate the same thing.  Came home and 2 hours later had nausea and vomiting Associated symptoms: nausea and vomiting   Associated symptoms: no chest pain, no dysuria and no fever   Patient with left renal artery stenosis who presents with abdominal pain and nausea and vomiting following eating out this evening.  Patient ate at [facility] cafe this evening and came home and approximately 2 hours later developed nausea and vomiting and diarrhea.  Another family member ate with her but not the same thing and was not sick.      Past Medical History:  Diagnosis Date   History of smoking    Hx of hyperlipidemia    Hyperlipidemia    Hypertension     Home Medications Prior to Admission medications   Medication Sig Start Date End Date Taking? Authorizing Provider  amLODipine (NORVASC) 10 MG tablet Take 10 mg by mouth daily. 10/20/20   [provider]  aspirin EC 81 MG tablet Take 81 mg by mouth daily. Swallow whole.    [provider]  cloNIDine (CATAPRES) 0.1 MG tablet Take 0.1 mg by mouth daily. 10/17/20   [provider]  clopidogrel (PLAVIX) 75 MG tablet Take 1 tablet (75 mg total) by mouth daily. 02/15/22 02/15/23  Maeola Harman, MD  metoprolol succinate (TOPROL-XL) 50 MG 24 hr tablet Take 50 mg by mouth daily. 09/05/20   [provider]  rosuvastatin (CRESTOR) 10 MG tablet  Take 1 tablet (10 mg total) by mouth daily. 02/15/22 02/15/23  Maeola Harman, MD  spironolactone (ALDACTONE) 50 MG tablet Take 50 mg by mouth 2 (two) times daily. 09/05/20   [provider]      Allergies    Amoxicillin, Losartan, Pseudoephedrine hcl, Hydralazine, Hydrocodone, Hydrocodone-acetaminophen, and Nitrofurantoin    Review of Systems   Review of Systems  Constitutional:  Negative for fever.  HENT:  Negative for facial swelling.   Eyes:  Negative for redness.  Respiratory:  Negative for wheezing and stridor.   Cardiovascular:  Negative for chest pain.  Gastrointestinal:  Positive for abdominal pain, nausea and vomiting.  Genitourinary:  Negative for dysuria and flank pain.    Physical Exam Updated Vital Signs BP (!) 144/63   Pulse 82   Temp 97.8 F (36.6 C)   Resp 17   Ht 5\' 4"  (1.626 m)   Wt 76.7 kg   LMP 04/11/2009 (Within Days)   SpO2 94%   BMI 29.01 kg/m  Physical Exam Vitals and nursing note reviewed.  Constitutional:      Appearance: Normal appearance. She is well-developed. She is not diaphoretic.  HENT:     Head: Normocephalic and atraumatic.     Nose: Nose normal.     Mouth/Throat:     Mouth: Mucous membranes are moist.  Eyes:     Pupils: Pupils are equal, round, and reactive  to light.  Cardiovascular:     Rate and Rhythm: Normal rate and regular rhythm.     Pulses: Normal pulses.     Heart sounds: Normal heart sounds.  Pulmonary:     Effort: Pulmonary effort is normal. No respiratory distress.     Breath sounds: Normal breath sounds.  Abdominal:     General: Bowel sounds are decreased. There is no distension.     Palpations: Abdomen is soft. There is no shifting dullness, hepatomegaly, splenomegaly, mass or pulsatile mass.     Tenderness: There is no abdominal tenderness. There is no guarding or rebound. Negative signs include Murphy's sign and McBurney's sign.  Genitourinary:    Vagina: No vaginal discharge.  Musculoskeletal:         General: Normal range of motion.     Cervical back: Normal range of motion and neck supple.  Skin:    General: Skin is warm and dry.     Capillary Refill: Capillary refill takes less than 2 seconds.     Findings: No erythema or rash.  Neurological:     General: No focal deficit present.     Mental Status: She is alert and oriented to person, place, and time.     Deep Tendon Reflexes: Reflexes normal.  Psychiatric:        Mood and Affect: Mood normal.     ED Results / Procedures / Treatments   Labs (all labs ordered are listed, but only abnormal results are displayed) Results for orders placed or performed during the hospital encounter of 06/27/22  Resp panel by RT-PCR (RSV, Flu A&B, Covid) Anterior Nasal Swab   Specimen: Anterior Nasal Swab  Result Value Ref Range   SARS Coronavirus 2 by RT PCR NEGATIVE NEGATIVE   Influenza A by PCR NEGATIVE NEGATIVE   Influenza B by PCR NEGATIVE NEGATIVE   Resp Syncytial Virus by PCR NEGATIVE NEGATIVE  Comprehensive metabolic panel  Result Value Ref Range   Sodium 129 (L) 135 - 145 mmol/L   Potassium 5.6 (H) 3.5 - 5.1 mmol/L   Chloride 101 98 - 111 mmol/L   CO2 15 (L) 22 - 32 mmol/L   Glucose, Bld 171 (H) 70 - 99 mg/dL   BUN 57 (H) 8 - 23 mg/dL   Creatinine, Ser 4.33 (H) 0.44 - 1.00 mg/dL   Calcium 9.4 8.9 - 29.5 mg/dL   Total Protein 8.2 (H) 6.5 - 8.1 g/dL   Albumin 4.0 3.5 - 5.0 g/dL   AST 28 15 - 41 U/L   ALT 25 0 - 44 U/L   Alkaline Phosphatase 73 38 - 126 U/L   Total Bilirubin 0.4 0.3 - 1.2 mg/dL   GFR, Estimated 18 (L) >60 mL/min   Anion gap 13 5 - 15  CBC  Result Value Ref Range   WBC 20.0 (H) 4.0 - 10.5 K/uL   RBC 4.35 3.87 - 5.11 MIL/uL   Hemoglobin 12.7 12.0 - 15.0 g/dL   HCT 18.8 41.6 - 60.6 %   MCV 85.1 80.0 - 100.0 fL   MCH 29.2 26.0 - 34.0 pg   MCHC 34.3 30.0 - 36.0 g/dL   RDW 30.1 60.1 - 09.3 %   Platelets 356 150 - 400 K/uL   nRBC 0.0 0.0 - 0.2 %   No results found.  EKG EKG  Interpretation  Date/Time:  Sunday June 27 2022 05:20:09 EST Ventricular Rate:  81 PR Interval:  166 QRS Duration: 95 QT Interval:  377 QTC Calculation:  438 R Axis:   65 Text Interpretation: Sinus rhythm Confirmed by Nicanor Alcon, Daivion Pape ([id number]) on 06/27/2022 6:23:06 AM  Radiology No results found.  Procedures Procedures    Medications Ordered in ED Medications  0.9 %  sodium chloride infusion (has no administration in time range)  fentaNYL (SUBLIMAZE) injection 50 mcg (has no administration in time range)  ondansetron (ZOFRAN-ODT) disintegrating tablet 4 mg (4 mg Oral Given 06/27/22 0459)    ED Course/ Medical Decision Making/ A&P                           Medical Decision Making Patient who ate out this evening and then developed abdominal pain and nausea and vomiting   Amount and/or Complexity of Data Reviewed Independent Historian:     Details: Family member see above  External Data Reviewed: labs and notes.    Details: Previous outpatient notes and labs reviewed have baseline renal insufficiency with creatinine 2.2  Labs: ordered.    Details: All labs reviewed: white count is 20 and elevated, normal hemoglobin 12.7 normal platelet count 356K.  Sodium low 129, potassium elevated 5.6, elevated BUN 57 and elevated creatinine 2.82.  Negative covid and flu and RSV Radiology: ordered and independent interpretation performed. ECG/medicine tests: ordered and independent interpretation performed. Decision-making details documented in ED Course.  Risk Prescription drug management. Decision regarding hospitalization.    Final Clinical Impression(s) / ED Diagnoses Final diagnoses:  Renal insufficiency  Nausea and vomiting, unspecified vomiting type   Signed out to Dr. Jacqulyn Bath pending labs and imaging and reassessment.      Gaylen Pereira, MD 07/21/22 2300

## 2022-06-27 NOTE — ED Provider Notes (Addendum)
Blood pressure (!) 146/68, pulse 77, temperature 97.8 F (36.6 C), resp. rate 10, height 5\' 4"  (1.626 m), weight 76.7 kg, last menstrual period 04/11/2009, SpO2 97 %.  Assuming care from Dr. 06/11/2009.  In short, Karen Jimenez is a 66 y.o. female with a chief complaint of Abdominal Pain .  Refer to the original H&P for additional details.  The current plan of care is to follow up on CT.  08:40 AM  Independently evaluated the CT scan of the abdomen and pelvis.  Agree with radiology interpretation.  Patient showing some increase in her creatinine as well as mild electrolyte disturbance.  She also has a significant leukocytosis.  When I speak with the patient she tells me that her initial preference would be to follow with her nephrologist but on further discussion she continues to have nausea.  We discussed her very high white blood cell count and CT findings.  She is in agreement with plan for obs admit, antibiotics, fluids to follow her renal function and response to treatment.  COVID, flu, RSV all negative.  Lipase negative.  Discussed patient's case with [facility], Dr. 76 to request admission. Patient and family (if present) updated with plan.  I reviewed all nursing notes, vitals, pertinent old records, EKGs, labs, imaging (as available).     Robb Matar, MD 06/27/22 06/29/22    [id number], MD 06/27/22 [phone]

## 2022-06-27 NOTE — ED Triage Notes (Signed)
N/v/d and abd pain since 2000  last night ~2 hours after returning home from dinner. Also reports being "a little out of breath". Denies fever.

## 2022-06-28 DIAGNOSIS — K5792 Diverticulitis of intestine, part unspecified, without perforation or abscess without bleeding: Secondary | ICD-10-CM | POA: Diagnosis not present

## 2022-06-28 LAB — BASIC METABOLIC PANEL
Anion gap: 9 (ref 5–15)
BUN: 36 mg/dL — ABNORMAL HIGH (ref 8–23)
CO2: 19 mmol/L — ABNORMAL LOW (ref 22–32)
Calcium: 9.2 mg/dL (ref 8.9–10.3)
Chloride: 105 mmol/L (ref 98–111)
Creatinine, Ser: 2.79 mg/dL — ABNORMAL HIGH (ref 0.44–1.00)
GFR, Estimated: 18 mL/min — ABNORMAL LOW (ref >60)
Glucose, Bld: 141 mg/dL — ABNORMAL HIGH (ref 70–99)
Potassium: 4.6 mmol/L (ref 3.5–5.1)
Sodium: 133 mmol/L — ABNORMAL LOW (ref 135–145)

## 2022-06-28 LAB — CBC WITH DIFFERENTIAL/PLATELET
Abs Immature Granulocytes: 0.04 10*3/uL (ref 0.00–0.07)
Basophils Absolute: 0 10*3/uL (ref 0.0–0.1)
Basophils Relative: 0 %
Eosinophils Absolute: 0.1 10*3/uL (ref 0.0–0.5)
Eosinophils Relative: 0 %
HCT: 34.6 % — ABNORMAL LOW (ref 36.0–46.0)
Hemoglobin: 11.1 g/dL — ABNORMAL LOW (ref 12.0–15.0)
Immature Granulocytes: 0 %
Lymphocytes Relative: 14 %
Lymphs Abs: 1.6 10*3/uL (ref 0.7–4.0)
MCH: 28.5 pg (ref 26.0–34.0)
MCHC: 32.1 g/dL (ref 30.0–36.0)
MCV: 88.7 fL (ref 80.0–100.0)
Monocytes Absolute: 0.8 10*3/uL (ref 0.1–1.0)
Monocytes Relative: 7 %
Neutro Abs: 8.8 10*3/uL — ABNORMAL HIGH (ref 1.7–7.7)
Neutrophils Relative %: 79 %
Platelets: 274 10*3/uL (ref 150–400)
RBC: 3.9 MIL/uL (ref 3.87–5.11)
RDW: 14.1 % (ref 11.5–15.5)
WBC: 11.3 10*3/uL — ABNORMAL HIGH (ref 4.0–10.5)
nRBC: 0 % (ref 0.0–0.2)

## 2022-06-28 LAB — GLUCOSE, CAPILLARY
Glucose-Capillary: 158 mg/dL — ABNORMAL HIGH (ref 70–99)
Glucose-Capillary: 102 mg/dL — ABNORMAL HIGH (ref 70–99)

## 2022-06-28 MED ORDER — METRONIDAZOLE 500 MG PO TABS: 500.0000 mg | ORAL_TABLET | Freq: Three times a day (TID) | ORAL | 0 refills | Status: AC

## 2022-06-28 MED ORDER — CEPHALEXIN 500 MG PO CAPS: 500.0000 mg | ORAL_CAPSULE | Freq: Three times a day (TID) | ORAL | 0 refills | Status: AC

## 2022-06-28 NOTE — Plan of Care (Signed)
Problem: Clinical Measurements: Goal: Ability to maintain clinical measurements within normal limits will improve Outcome: Progressing   Problem: Activity: Goal: Risk for activity intolerance will decrease Outcome: Progressing   Problem: Elimination: Goal: Will not experience complications related to bowel motility Outcome: Progressing   Problem: Pain Managment: Goal: General experience of comfort will improve Outcome: Progressing   Haydee Salter, RN 06/28/22 8:11 AM

## 2022-06-28 NOTE — Plan of Care (Signed)
Patient discharging home via private vehicle with husband. Haydee Salter, RN 06/28/22 12:59 PM

## 2022-06-28 NOTE — Care Management Obs Status (Signed)
MEDICARE OBSERVATION STATUS NOTIFICATION   Patient Details  Name: Karen Jimenez MRN: [id number] Date of Birth: [birth year]   Medicare Observation Status Notification Given:  Yes    Amada Jupiter, LCSW 06/28/2022, 12:04 PM

## 2022-06-28 NOTE — Care Management CC44 (Signed)
Condition Code 44 Documentation Completed  Patient Details  Name: JORDIS REPETTO MRN: [id number] Date of Birth: [birth year]   Condition Code 44 given:  Yes Patient signature on Condition Code 44 notice:  Yes Documentation of 2 MD's agreement:  Yes Code 44 added to claim:  Yes    Orvel Cutsforth, LCSW 06/28/2022, 12:04 PM

## 2022-06-28 NOTE — Discharge Summary (Signed)
Physician Discharge Summary  Karen Jimenez:[id number] DOB: 1955-09-02 DOA: 06/27/2022  PCP: Shon Hale, MD  Admit date: 06/27/2022 Discharge date: 06/28/2022  Admitted From: Home  Disposition:  Home   Recommendations for Outpatient Follow-up:  Follow up with PCP in 1-2 weeks Please obtain BMP/CBC in one week  Home Health:NA  Equipment/Devices:NA   Discharge Condition:Stable   CODE STATUS:Full Code  Diet recommendation: Full liquid diet, soft food.  Avoid constipation.  Discharge summary:  66 year old with history of anxiety, hypertension, CKD stage IIIb with baseline creatinine about 2.7-2.8, hyperlipidemia, recent left renal artery stenosis stenting presented with 2 days of abdominal pain, nausea vomiting and diarrhea after having a dinner.  In the emergency room hemodynamically stable.  On room air.  Clinically dehydrated.  WBC count 20,000.  COVID influenza RSV negative.  CT scan abdomen showed mild inflammatory changes of the sigmoid colon with extensive diverticular disease but no complication.  Acute uncomplicated sigmoid diverticulitis with moderate dehydration, intractable abdominal pain and diarrhea. Clinically improved.  Tolerating full liquid diet.  Wishes to go home.  Currently asymptomatic. Home with full liquid and soft diet. Receiving Rocephin and Flagyl in the hospital, will change to Keflex and Flagyl for 10 days. Patient had colonoscopy within 1 year with gastroenterology, results not available but reportedly with diverticulosis and nonspecific polyps.  She will follow-up with GI. Electrolytes are adequate. WBC count normalized. Renal functions are abnormal but at her usual levels as per patient.  Discharge Diagnoses:  Principal Problem:   Acute diverticulitis Active Problems:   Prediabetes   Pure hypercholesterolemia   SVT (supraventricular tachycardia)   Hyperkalemia   Hypertension   AKI (acute kidney injury) (HCC)   CAD (coronary  artery disease)    Discharge Instructions  Discharge Instructions     Diet - low sodium heart healthy   Complete by: As directed    Stay on liquid and very soft diet for next few days .   Discharge instructions   Complete by: As directed    Ensure smooth bowel movement daily. May take stool softener if needed.   Increase activity slowly   Complete by: As directed       Allergies as of 06/28/2022       Reactions   Amoxicillin Hives   Losartan    Other reaction(s): Other (See Comments) Severe AKI   Pseudoephedrine Hcl Palpitations   Heart rate increases   Hydralazine Nausea Only, Rash   Hydrocodone Nausea And Vomiting   Headache   Hydrocodone-acetaminophen    Other reaction(s): nausea/vomiting   Nitrofurantoin Other (See Comments)   Body aches        Medication List     STOP taking these medications    clopidogrel 75 MG tablet Commonly known as: Plavix       TAKE these medications    amLODipine 10 MG tablet Commonly known as: NORVASC Take 10 mg by mouth daily.   aspirin EC 81 MG tablet Take 81 mg by mouth daily. Swallow whole.   cephALEXin 500 MG capsule Commonly known as: KEFLEX Take 1 capsule (500 mg total) by mouth 3 (three) times daily for 10 days.   cloNIDine 0.1 MG tablet Commonly known as: CATAPRES Take 0.1 mg by mouth daily.   metoprolol succinate 50 MG 24 hr tablet Commonly known as: TOPROL-XL Take 50 mg by mouth daily.   metroNIDAZOLE 500 MG tablet Commonly known as: Flagyl Take 1 tablet (500 mg total) by mouth 3 (three) times daily  for 10 days.   rosuvastatin 10 MG tablet Commonly known as: Crestor Take 1 tablet (10 mg total) by mouth daily.   spironolactone 50 MG tablet Commonly known as: ALDACTONE Take 50 mg by mouth 2 (two) times daily.        Allergies  Allergen Reactions   Amoxicillin Hives   Losartan     Other reaction(s): Other (See Comments) Severe AKI   Pseudoephedrine Hcl Palpitations    Heart rate  increases   Hydralazine Nausea Only and Rash   Hydrocodone Nausea And Vomiting    Headache   Hydrocodone-Acetaminophen     Other reaction(s): nausea/vomiting   Nitrofurantoin Other (See Comments)    Body aches    Consultations: None   Procedures/Studies: CT Renal Stone Study  Result Date: 06/27/2022 CLINICAL DATA:  66 year old female with history of abdominal and flank pain. Suspected kidney stone. Nausea, vomiting, diarrhea and abdominal pain since yesterday evening. EXAM: CT ABDOMEN AND PELVIS WITHOUT CONTRAST TECHNIQUE: Multidetector CT imaging of the abdomen and pelvis was performed following the standard protocol without IV contrast. RADIATION DOSE REDUCTION: This exam was performed according to the departmental dose-optimization program which includes automated exposure control, adjustment of the mA and/or kV according to patient size and/or use of iterative reconstruction technique. COMPARISON:  CT of the abdomen and pelvis 10/07/2016. FINDINGS: Lower chest: Atherosclerotic calcifications in the descending thoracic aorta. Small hiatal hernia. Hepatobiliary: No definite suspicious cystic or solid hepatic lesions confidently identified on today's noncontrast CT examination. Unenhanced appearance of the gallbladder is unremarkable. Pancreas: No definite pancreatic mass or peripancreatic fluid collections or inflammatory changes are noted on today's noncontrast CT examination. Spleen: Unremarkable. Adrenals/Urinary Tract: There are no abnormal calcifications within the collecting system of either kidney, along the course of either ureter, or within the lumen of the urinary bladder. No hydroureteronephrosis or perinephric stranding to suggest urinary tract obstruction at this time. Mild multifocal cortical thinning in the right kidney, similar to the prior examination. Unenhanced appearance of the left kidney and bilateral adrenal glands is otherwise unremarkable. Urinary bladder is normal in  appearance. Stomach/Bowel: Unenhanced appearance of the stomach is normal. There is no pathologic dilatation of small bowel or colon. Numerous colonic diverticuli are noted, particularly in the sigmoid colon, where there is some very subtle surrounding inflammatory changes which could be indicative of very early or mild acute diverticulitis. No diverticular abscess or signs of frank perforation. The appendix is not confidently identified and may be surgically absent. Regardless, there are no inflammatory changes noted adjacent to the cecum to suggest the presence of an acute appendicitis at this time. Vascular/Lymphatic: Atherosclerotic calcifications throughout the abdominal aorta and pelvic vasculature. No lymphadenopathy noted in the abdomen or pelvis. Reproductive: Unenhanced appearance of the uterus and ovaries is unremarkable. Other: No significant volume of ascites.  No pneumoperitoneum. Musculoskeletal: There are no aggressive appearing lytic or blastic lesions noted in the visualized portions of the skeleton. IMPRESSION: 1. Mild inflammatory changes adjacent to the sigmoid colon in a region of extensive diverticular disease suggestive of very mild or early acute diverticulitis. No diverticular abscess or signs of frank perforation are noted at this time. 2. No urinary tract calculi or findings of urinary tract obstruction. 3. Mild multifocal cortical thinning in the right kidney. 4. Aortic atherosclerosis. 5. Small hiatal hernia. Electronically Signed   By: Trudie Reed M.D.   On: 06/27/2022 07:11   (Echo, Carotid, EGD, Colonoscopy, ERCP)    Subjective: Patient seen in the morning rounds.  Before my arrival she wanted to try some full liquid diet.  She ate and now mobilizing around the hallway.  Denies any nausea vomiting or abdominal pain.  She wants to go home and able to manage symptoms at home.   Discharge Exam: Vitals:   06/27/22 2138 06/28/22 0955  BP: (!) 150/73 (!) 142/61  Pulse: 98  77  Resp: 12 18  Temp: 98.3 F (36.8 C) 98.3 F (36.8 C)  SpO2: 97% 97%   Vitals:   06/27/22 1405 06/27/22 1921 06/27/22 2138 06/28/22 0955  BP: (!) 163/75 (!) 164/78 (!) 150/73 (!) 142/61  Pulse: 96 100 98 77  Resp: 16 14 12 18   Temp: 99.4 F (37.4 C) 99.7 F (37.6 C) 98.3 F (36.8 C) 98.3 F (36.8 C)  TempSrc: Oral  Oral Oral  SpO2: 97% 97% 97% 97%  Weight:      Height:        General: Pt is alert, awake, not in acute distress Cardiovascular: RRR, S1/S2 +, no rubs, no gallops Respiratory: CTA bilaterally, no wheezing, no rhonchi Abdominal: Soft, NT, ND, bowel sounds + Extremities: no edema, no cyanosis    The results of significant diagnostics from this hospitalization (including imaging, microbiology, ancillary and laboratory) are listed below for reference.     Microbiology: Recent Results (from the past 240 hour(s))  Resp panel by RT-PCR (RSV, Flu A&B, Covid) Anterior Nasal Swab     Status: None   Collection Time: 06/27/22  5:15 AM   Specimen: Anterior Nasal Swab  Result Value Ref Range Status   SARS Coronavirus 2 by RT PCR NEGATIVE NEGATIVE Final    Comment: (NOTE) SARS-CoV-2 target nucleic acids are NOT DETECTED.  The SARS-CoV-2 RNA is generally detectable in upper respiratory specimens during the acute phase of infection. The lowest concentration of SARS-CoV-2 viral copies this assay can detect is 138 copies/mL. A negative result does not preclude SARS-Cov-2 infection and should not be used as the sole basis for treatment or other patient management decisions. A negative result may occur with  improper specimen collection/handling, submission of specimen other than nasopharyngeal swab, presence of viral mutation(s) within the areas targeted by this assay, and inadequate number of viral copies(<138 copies/mL). A negative result must be combined with clinical observations, patient history, and epidemiological information. The expected result is  Negative.  Fact Sheet for Patients:  06/29/22  Fact Sheet for Healthcare Providers:  BloggerCourse.com  This test is no t yet approved or cleared by the [place] FDA and  has been authorized for detection and/or diagnosis of SARS-CoV-2 by FDA under an Emergency Use Authorization (EUA). This EUA will remain  in effect (meaning this test can be used) for the duration of the COVID-19 declaration under Section 564(b)(1) of the Act, 21 U.S.C.section 360bbb-3(b)(1), unless the authorization is terminated  or revoked sooner.       Influenza A by PCR NEGATIVE NEGATIVE Final   Influenza B by PCR NEGATIVE NEGATIVE Final    Comment: (NOTE) The Xpert Xpress SARS-CoV-2/FLU/RSV plus assay is intended as an aid in the diagnosis of influenza from Nasopharyngeal swab specimens and should not be used as a sole basis for treatment. Nasal washings and aspirates are unacceptable for Xpert Xpress SARS-CoV-2/FLU/RSV testing.  Fact Sheet for Patients: [place]  Fact Sheet for Healthcare Providers: BloggerCourse.com  This test is not yet approved or cleared by the [place] FDA and has been authorized for detection and/or diagnosis of SARS-CoV-2 by FDA under an Emergency  Use Authorization (EUA). This EUA will remain in effect (meaning this test can be used) for the duration of the COVID-19 declaration under Section 564(b)(1) of the Act, 21 U.S.C. section 360bbb-3(b)(1), unless the authorization is terminated or revoked.     Resp Syncytial Virus by PCR NEGATIVE NEGATIVE Final    Comment: (NOTE) Fact Sheet for Patients: BloggerCourse.comhttps://www.fda.gov/media/152166/download  Fact Sheet for Healthcare Providers: [employer].ithttps://www.fda.gov/media/152162/download  This test is not yet approved or cleared by the [place] FDA and has been authorized for detection and/or diagnosis of  SARS-CoV-2 by FDA under an Emergency Use Authorization (EUA). This EUA will remain in effect (meaning this test can be used) for the duration of the COVID-19 declaration under Section 564(b)(1) of the Act, 21 U.S.C. section 360bbb-3(b)(1), unless the authorization is terminated or revoked.  Performed at [facility], [address] [place], [place] [address]      Labs: BNP (last 3 results) No results for input(s): "BNP" in the last 8760 hours. Basic Metabolic Panel: Recent Labs  Lab 06/27/22 0515 06/28/22 0920  NA 129* 133*  K 5.6* 4.6  CL 101 105  CO2 15* 19*  GLUCOSE 171* 141*  BUN 57* 36*  CREATININE 2.82* 2.79*  CALCIUM 9.4 9.2   Liver Function Tests: Recent Labs  Lab 06/27/22 0515  AST 28  ALT 25  ALKPHOS 73  BILITOT 0.4  PROT 8.2*  ALBUMIN 4.0   Recent Labs  Lab 06/27/22 0515  LIPASE 46   No results for input(s): "AMMONIA" in the last 168 hours. CBC: Recent Labs  Lab 06/27/22 0515 06/28/22 0920  WBC 20.0* 11.3*  NEUTROABS  --  8.8*  HGB 12.7 11.1*  HCT 37.0 34.6*  MCV 85.1 88.7  PLT 356 274   Cardiac Enzymes: No results for input(s): "CKTOTAL", "CKMB", "CKMBINDEX", "TROPONINI" in the last 168 hours. BNP: Invalid input(s): "POCBNP" CBG: Recent Labs  Lab 06/28/22 0946  GLUCAP 158*   D-Dimer No results for input(s): "DDIMER" in the last 72 hours. Hgb A1c No results for input(s): "HGBA1C" in the last 72 hours. Lipid Profile No results for input(s): "CHOL", "HDL", "LDLCALC", "TRIG", "CHOLHDL", "LDLDIRECT" in the last 72 hours. Thyroid function studies No results for input(s): "TSH", "T4TOTAL", "T3FREE", "THYROIDAB" in the last 72 hours.  Invalid input(s): "FREET3" Anemia work up No results for input(s): "VITAMINB12", "FOLATE", "FERRITIN", "TIBC", "IRON", "RETICCTPCT" in the last 72 hours. Urinalysis    Component Value Date/Time   COLORURINE YELLOW 06/27/2022 0916   APPEARANCEUR CLEAR 06/27/2022 0916   LABSPEC 1.015  06/27/2022 0916   PHURINE 5.5 06/27/2022 0916   GLUCOSEU NEGATIVE 06/27/2022 0916   HGBUR SMALL (A) 06/27/2022 0916   BILIRUBINUR NEGATIVE 06/27/2022 0916   BILIRUBINUR N 09/03/2016 1343   KETONESUR NEGATIVE 06/27/2022 0916   PROTEINUR 30 (A) 06/27/2022 0916   UROBILINOGEN negative 09/03/2016 1343   UROBILINOGEN 0.2 06/29/2014 1846   NITRITE NEGATIVE 06/27/2022 0916   LEUKOCYTESUR NEGATIVE 06/27/2022 0916   Sepsis Labs Recent Labs  Lab 06/27/22 0515 06/28/22 0920  WBC 20.0* 11.3*   Microbiology Recent Results (from the past 240 hour(s))  Resp panel by RT-PCR (RSV, Flu A&B, Covid) Anterior Nasal Swab     Status: None   Collection Time: 06/27/22  5:15 AM   Specimen: Anterior Nasal Swab  Result Value Ref Range Status   SARS Coronavirus 2 by RT PCR NEGATIVE NEGATIVE Final    Comment: (NOTE) SARS-CoV-2 target nucleic acids are NOT DETECTED.  The SARS-CoV-2 RNA is generally  detectable in upper respiratory specimens during the acute phase of infection. The lowest concentration of SARS-CoV-2 viral copies this assay can detect is 138 copies/mL. A negative result does not preclude SARS-Cov-2 infection and should not be used as the sole basis for treatment or other patient management decisions. A negative result may occur with  improper specimen collection/handling, submission of specimen other than nasopharyngeal swab, presence of viral mutation(s) within the areas targeted by this assay, and inadequate number of viral copies(<138 copies/mL). A negative result must be combined with clinical observations, patient history, and epidemiological information. The expected result is Negative.  Fact Sheet for Patients:  BloggerCourse.com  Fact Sheet for Healthcare Providers:  [employer]ious[employer]ker.it  This test is no t yet approved or cleared by the [place] FDA and  has been authorized for detection and/or diagnosis of SARS-CoV-2 by FDA  under an Emergency Use Authorization (EUA). This EUA will remain  in effect (meaning this test can be used) for the duration of the COVID-19 declaration under Section 564(b)(1) of the Act, 21 U.S.C.section 360bbb-3(b)(1), unless the authorization is terminated  or revoked sooner.       Influenza A by PCR NEGATIVE NEGATIVE Final   Influenza B by PCR NEGATIVE NEGATIVE Final    Comment: (NOTE) The Xpert Xpress SARS-CoV-2/FLU/RSV plus assay is intended as an aid in the diagnosis of influenza from Nasopharyngeal swab specimens and should not be used as a sole basis for treatment. Nasal washings and aspirates are unacceptable for Xpert Xpress SARS-CoV-2/FLU/RSV testing.  Fact Sheet for Patients: BloggerCourse.com  Fact Sheet for Healthcare Providers: [employer]  This test is not yet approved or cleared by the [place] FDA and has been authorized for detection and/or diagnosis of SARS-CoV-2 by FDA under an Emergency Use Authorization (EUA). This EUA will remain in effect (meaning this test can be used) for the duration of the COVID-19 declaration under Section 564(b)(1) of the Act, 21 U.S.C. section 360bbb-3(b)(1), unless the authorization is terminated or revoked.     Resp Syncytial Virus by PCR NEGATIVE NEGATIVE Final    Comment: (NOTE) Fact Sheet for Patients: BloggerCourse.com  Fact Sheet for Healthcare Providers: [employer]ious[employer]ker.it  This test is not yet approved or cleared by the [place] FDA and has been authorized for detection and/or diagnosis of SARS-CoV-2 by FDA under an Emergency Use Authorization (EUA). This EUA will remain in effect (meaning this test can be used) for the duration of the COVID-19 declaration under Section 564(b)(1) of the Act, 21 U.S.C. section 360bbb-3(b)(1), unless the authorization is terminated or revoked.  Performed at [facility], [address] [place], [place] [address]      Time coordinating discharge:  35 minutes  SIGNED:   Dorcas Carrow, MD  [facility] 06/28/2022, 10:53 AM

## 2022-06-29 LAB — HEMOGLOBIN A1C
Hgb A1c MFr Bld: 6.4 % — ABNORMAL HIGH (ref 4.8–5.6)
Mean Plasma Glucose: 137 mg/dL

## 2022-10-20 ENCOUNTER — Other Ambulatory Visit: Payer: Self-pay

## 2022-10-20 DIAGNOSIS — I701 Atherosclerosis of renal artery: Secondary | ICD-10-CM

## 2022-10-29 ENCOUNTER — Encounter: Payer: Self-pay | Admitting: Emergency Medicine

## 2022-10-29 ENCOUNTER — Ambulatory Visit
Admission: EM | Admit: 2022-10-29 | Discharge: 2022-10-29 | Disposition: A | Discharge status: Home / Self Care | Payer: Medicare Other | Attending: Family Medicine | Admitting: Family Medicine

## 2022-10-29 DIAGNOSIS — I151 Hypertension secondary to other renal disorders: Secondary | ICD-10-CM | POA: Diagnosis not present

## 2022-10-29 NOTE — ED Triage Notes (Signed)
Pt reports hx HTN and kidney issues. Reports that over past week having headaches. Today when checked BP systolic 200s. Pt wanting BP checked. Pt believes that headaches are related to eyes.

## 2022-10-29 NOTE — Discharge Instructions (Signed)
You were seen today for hypertension.  Your blood pressure is elevated today, and you should reach out to your specialist to discuss restarting your medications.  If you develop any chest pain, shortness of breath, or dizziness then please go to the ER for evaluation.  In the mean time I recommend you go home and put your feet up, get plenty of rest and fluids.

## 2022-10-29 NOTE — ED Provider Notes (Signed)
[facility]    CSN: [id number] Arrival date & time: 10/29/22  1251      History   Chief Complaint Chief Complaint  Patient presents with   Hypertension    HPI Karen Jimenez is a 67 y.o. female.   She has had a headache for almost a week, but today it has been worse.  She thinks this is related to her eyes, but checked her bp at home.  At home it registered as 211 systolic, but she did not feel the way she usually does when her bp is elevated.  She knows she needs new glasses.  The pressure was all at her eyes and in the frontal area.  No runny nose, congestion, drainage.  She otherwise feels very well. She states when her bp has been elevated in the past she feels absolutely awful.   Denies any cp, sob, dizziness, light headedness.  She usually only takes only tylenol, but she did take motrin b/c the headache was so bad.     She had a kidney stent in August, her bp was good at that time.  She was then weaned off of two medications as of the last month. She thinks she needs to restart them, but will reach out to her doctor today again.        Past Medical History:  Diagnosis Date   Anxiety 12/29/2017   Benign hypertension with chronic kidney disease, stage III 02/25/2020   History of smoking    Hx of hyperlipidemia    Hyperlipidemia    Hypertension    Hyponatremia 02/13/2020   Left renal artery stenosis 02/13/2020   Low back pain 04/18/2020   Prediabetes 01/21/2017   SVT (supraventricular tachycardia) 01/21/2017   Vitamin D deficiency disease 01/21/2017    Patient Active Problem List   Diagnosis Date Noted   Acute diverticulitis 06/27/2022   Hyperkalemia 06/27/2022   Hypertension 06/27/2022   AKI (acute kidney injury) 06/27/2022   CAD (coronary artery disease) 06/27/2022   Low back pain 04/18/2020   Weight gain 02/25/2020   Benign hypertension with chronic kidney disease, stage III 02/25/2020   Hyponatremia 02/13/2020   Left renal artery  stenosis 02/13/2020   Persistent proteinuria 02/13/2020   Hypertensive urgency 02/04/2020   Anxiety 12/29/2017   NSTEMI (non-ST elevated myocardial infarction) 01/21/2017   Prediabetes 01/21/2017   Pure hypercholesterolemia 01/21/2017   SVT (supraventricular tachycardia) 01/21/2017   Vitamin D deficiency disease 01/21/2017   Paronychia of finger 01/21/2017   Knee joint replacement by other means 09/24/2013    Past Surgical History:  Procedure Laterality Date   APPENDECTOMY     KNEE ARTHROSCOPY Right 2011, 2012   MEDIAL PARTIAL KNEE REPLACEMENT  11/2010   PERIPHERAL VASCULAR INTERVENTION  02/15/2022   Procedure: PERIPHERAL VASCULAR INTERVENTION;  Surgeon: Maeola Harman, MD;  Location: [facility] INVASIVE CV LAB;  Service: Cardiovascular;;  LT Renal   RENAL ANGIOGRAPHY N/A 02/15/2022   Procedure: RENAL ANGIOGRAPHY;  Surgeon: Maeola Harman, MD;  Location: [facility] INVASIVE CV LAB;  Service: Cardiovascular;  Laterality: N/A;   TONSILLECTOMY AND ADENOIDECTOMY      OB History     Gravida  2   Para  2   Term  2   Preterm  0   AB  0   Living  2      SAB  0   IAB  0   Ectopic  0   Multiple  0   Live Births  2  Home Medications    Prior to Admission medications   Medication Sig Start Date End Date Taking? Authorizing Provider  amLODipine (NORVASC) 10 MG tablet Take 10 mg by mouth daily. 10/20/20   [provider]  aspirin EC 81 MG tablet Take 81 mg by mouth daily. Swallow whole.    [provider]  cloNIDine (CATAPRES) 0.1 MG tablet Take 0.1 mg by mouth daily. 10/17/20   [provider]  metoprolol succinate (TOPROL-XL) 50 MG 24 hr tablet Take 50 mg by mouth daily. 09/05/20   [provider]  rosuvastatin (CRESTOR) 10 MG tablet Take 1 tablet (10 mg total) by mouth daily. 02/15/22 02/15/23  Maeola Harman, MD  spironolactone (ALDACTONE) 50 MG tablet Take 50 mg by mouth 2 (two) times daily. 09/05/20   [provider]    Family History Family History  Problem Relation Age of Onset   Colon cancer Mother 61   Lung cancer Father 47    Social History Social History   Tobacco Use   Smoking status: Former    Packs/day: 0.50    Years: 30.00    Additional pack years: 0.00    Total pack years: 15.00    Types: Cigarettes   Smokeless tobacco: Never  Vaping Use   Vaping Use: Never used  Substance Use Topics   Alcohol use: No   Drug use: No     Allergies   Amoxicillin, Losartan, Pseudoephedrine hcl, Hydralazine, Hydrocodone, Hydrocodone-acetaminophen, and Nitrofurantoin   Review of Systems Review of Systems  Constitutional: Negative.   HENT: Negative.    Respiratory: Negative.    Cardiovascular: Negative.   Gastrointestinal: Negative.   Genitourinary: Negative.   Musculoskeletal: Negative.   Neurological:  Positive for headaches. Negative for dizziness, speech difficulty, weakness, light-headedness and numbness.  Psychiatric/Behavioral: Negative.       Physical Exam Triage Vital Signs ED Triage Vitals  Enc Vitals Group     BP 10/29/22 1338 (!) 190/80     Pulse Rate 10/29/22 1338 60     Resp 10/29/22 1338 17     Temp 10/29/22 1338 98.4 F (36.9 C)     Temp Source 10/29/22 1338 Oral     SpO2 10/29/22 1338 97 %     Weight --      Height --      Head Circumference --      Peak Flow --      Pain Score 10/29/22 1337 0     Pain Loc --      Pain Edu? --      Excl. in GC? --    No data found.  Updated Vital Signs BP (!) 195/82 (BP Location: Right Arm)   Pulse 60   Temp 98.4 F (36.9 C) (Oral)   Resp 17   LMP 04/11/2009 (Within Days)   SpO2 97%   Visual Acuity Right Eye Distance:   Left Eye Distance:   Bilateral Distance:    Right Eye Near:   Left Eye Near:    Bilateral Near:     Physical Exam Constitutional:      General: She is not in acute distress.    Appearance: Normal appearance. She is not ill-appearing.  HENT:     Right Ear: Tympanic  membrane normal.     Left Ear: Tympanic membrane normal.     Nose: Nose normal.     Mouth/Throat:     Mouth: Mucous membranes are moist.  Cardiovascular:     Rate  and Rhythm: Normal rate and regular rhythm.  Pulmonary:     Effort: Pulmonary effort is normal.     Breath sounds: Normal breath sounds.  Musculoskeletal:     Cervical back: Normal range of motion and neck supple. No tenderness.  Skin:    General: Skin is warm.  Neurological:     General: No focal deficit present.     Mental Status: She is alert and oriented to person, place, and time.     Cranial Nerves: No cranial nerve deficit.     Motor: No weakness.  Psychiatric:        Mood and Affect: Mood normal.      UC Treatments / Results  Labs (all labs ordered are listed, but only abnormal results are displayed) Labs Reviewed - No data to display  EKG   Radiology No results found.  Procedures Procedures (including critical care time)  Medications Ordered in UC Medications - No data to display  Initial Impression / Assessment and Plan / UC Course  I have reviewed the triage vital signs and the nursing notes.  Pertinent labs & imaging results that were available during my care of the patient were reviewed by me and considered in my medical decision making (see chart for details).   Patient was see today for bp check.  She has known htn, sees a specialist, and recently had her medications changed. She has had a headache, but no other symptoms today. She states that if she feels poorly due to htn, she feels much worse, and is without any other symptoms today.  Discussed that as she is not having any other symptoms other than headache, I do not feel she needs to go to the ER at this time. She will call her specialist for directions about restarting her medications.  If she develops other symptoms to go to \\the  ER for further evaluation.  Final Clinical Impressions(s) / UC Diagnoses   Final diagnoses:   Hypertension secondary to other renal disorders     Discharge Instructions      You were seen today for hypertension.  Your blood pressure is elevated today, and you should reach out to your specialist to discuss restarting your medications.  If you develop any chest pain, shortness of breath, or dizziness then please go to the ER for evaluation.  In the mean time I recommend you go home and put your feet up, get plenty of rest and fluids.     ED Prescriptions   None    PDMP not reviewed this encounter.   Jannifer Franklin, MD 10/29/22 [id number]

## 2022-11-01 ENCOUNTER — Ambulatory Visit (HOSPITAL_COMMUNITY)
Admission: RE | Admit: 2022-11-01 | Discharge: 2022-11-01 | Disposition: A | Discharge status: Home / Self Care | Payer: Medicare Other | Source: Ambulatory Visit | Attending: Surgery | Admitting: Surgery

## 2022-11-01 DIAGNOSIS — Z87891 Personal history of nicotine dependence: Secondary | ICD-10-CM | POA: Diagnosis not present

## 2022-11-01 DIAGNOSIS — Z95828 Presence of other vascular implants and grafts: Secondary | ICD-10-CM

## 2022-11-01 DIAGNOSIS — I10 Essential (primary) hypertension: Secondary | ICD-10-CM

## 2022-11-01 DIAGNOSIS — E785 Hyperlipidemia, unspecified: Secondary | ICD-10-CM

## 2022-11-01 DIAGNOSIS — I701 Atherosclerosis of renal artery: Secondary | ICD-10-CM | POA: Diagnosis present

## 2022-11-03 ENCOUNTER — Encounter: Payer: Self-pay | Admitting: Vascular Surgery

## 2022-11-03 ENCOUNTER — Encounter (HOSPITAL_COMMUNITY): Payer: Medicare Other

## 2022-11-03 ENCOUNTER — Ambulatory Visit (INDEPENDENT_AMBULATORY_CARE_PROVIDER_SITE_OTHER): Payer: Medicare Other | Admitting: Vascular Surgery

## 2022-11-03 VITALS — BP 187/84 | HR 78 | Temp 98.0°F | Resp 20 | Ht 64.0 in | Wt 167.0 lb

## 2022-11-03 DIAGNOSIS — I701 Atherosclerosis of renal artery: Secondary | ICD-10-CM

## 2022-11-03 NOTE — Progress Notes (Signed)
Patient ID: Karen Jimenez, female   DOB: 06-05-1956, 67 y.o.   MRN: [id number]  Reason for Consult: Follow-up   Referred by Shon Hale, *  Subjective:     HPI:  Karen Jimenez is a 67 y.o. female with history of left renal artery stenting 8 months ago for increasing creatinine and requirement for antihypertensive medications with 1 hospital admission for hypertensive urgency.  Initially after the stent she said she did very well but more recently has attempted to wean her medications has been unable to do so and initially was taken off of amlodipine and now is back on clonidine, metoprolol and amlodipine but continues to hold spironolactone.  She did have a CT scan in December without contrast to rule out kidney stone at the time that she had gastroenteritis.  She remains on aspirin and a statin and stopped Plavix after 3 months.  She is currently frustrated that her blood pressure remains elevated.  Past Medical History:  Diagnosis Date   Anxiety 12/29/2017   Benign hypertension with chronic kidney disease, stage III 02/25/2020   History of smoking    Hx of hyperlipidemia    Hyperlipidemia    Hypertension    Hyponatremia 02/13/2020   Left renal artery stenosis 02/13/2020   Low back pain 04/18/2020   Prediabetes 01/21/2017   SVT (supraventricular tachycardia) 01/21/2017   Vitamin D deficiency disease 01/21/2017   Family History  Problem Relation Age of Onset   Colon cancer Mother 21   Lung cancer Father 81   Past Surgical History:  Procedure Laterality Date   APPENDECTOMY     KNEE ARTHROSCOPY Right 2011, 2012   MEDIAL PARTIAL KNEE REPLACEMENT  11/2010   PERIPHERAL VASCULAR INTERVENTION  02/15/2022   Procedure: PERIPHERAL VASCULAR INTERVENTION;  Surgeon: Maeola Harman, MD;  Location: [facility] INVASIVE CV LAB;  Service: Cardiovascular;;  LT Renal   RENAL ANGIOGRAPHY N/A 02/15/2022   Procedure: RENAL ANGIOGRAPHY;  Surgeon: Maeola Harman, MD;   Location: [facility] INVASIVE CV LAB;  Service: Cardiovascular;  Laterality: N/A;   TONSILLECTOMY AND ADENOIDECTOMY      Short Social History:  Social History   Tobacco Use   Smoking status: Former    Packs/day: 0.50    Years: 30.00    Additional pack years: 0.00    Total pack years: 15.00    Types: Cigarettes   Smokeless tobacco: Never  Substance Use Topics   Alcohol use: No    Allergies  Allergen Reactions   Amoxicillin Hives   Losartan     Other reaction(s): Other (See Comments) Severe AKI   Pseudoephedrine Hcl Palpitations    Heart rate increases   Hydralazine Nausea Only and Rash   Hydrocodone Nausea And Vomiting    Headache   Hydrocodone-Acetaminophen     Other reaction(s): nausea/vomiting   Nitrofurantoin Other (See Comments)    Body aches    Current Outpatient Medications  Medication Sig Dispense Refill   amLODipine (NORVASC) 10 MG tablet Take 10 mg by mouth daily.     aspirin EC 81 MG tablet Take 81 mg by mouth daily. Swallow whole.     cloNIDine (CATAPRES) 0.1 MG tablet Take 0.1 mg by mouth daily.     metoprolol succinate (TOPROL-XL) 50 MG 24 hr tablet Take 50 mg by mouth daily.     rosuvastatin (CRESTOR) 10 MG tablet Take 1 tablet (10 mg total) by mouth daily. 30 tablet 11   spironolactone (ALDACTONE) 50 MG tablet Take 50  mg by mouth 2 (two) times daily.     No current facility-administered medications for this visit.    Review of Systems  Constitutional:  Constitutional negative. HENT: HENT negative.  Eyes: Eyes negative.  Respiratory: Respiratory negative.  Cardiovascular: Cardiovascular negative.  GI: Gastrointestinal negative.  Musculoskeletal: Musculoskeletal negative.  Skin: Skin negative.  Neurological: Neurological negative. Hematologic: Hematologic/lymphatic negative.  Psychiatric: Psychiatric negative.        Objective:  Objective   Vitals:   11/03/22 0835  BP: (!) 187/84  Pulse: 78  Resp: 20  Temp: 98 F (36.7 C)  SpO2: 97%      Physical Exam HENT:     Head: Normocephalic.     Nose: Nose normal.  Eyes:     Pupils: Pupils are equal, round, and reactive to light.  Neck:     Vascular: No carotid bruit.  Cardiovascular:     Rate and Rhythm: Normal rate.  Abdominal:     General: Abdomen is flat. There is no distension.     Palpations: Abdomen is soft.     Tenderness: There is no abdominal tenderness.     Comments: No bruit  Musculoskeletal:        General: Normal range of motion.     Cervical back: Normal range of motion and neck supple.     Right lower leg: No edema.     Left lower leg: No edema.  Skin:    General: Skin is warm and dry.     Capillary Refill: Capillary refill takes less than 2 seconds.  Neurological:     General: No focal deficit present.     Mental Status: She is alert.  Psychiatric:        Mood and Affect: Mood normal.     Data: Duplex Findings:   +------------------+--------+--------+-------+  Right Renal ArteryPSV cm/sEDV cm/sComment  +------------------+--------+--------+-------+  Origin             138      26            +------------------+--------+--------+-------+  Proximal           146      38            +------------------+--------+--------+-------+  Mid                115      23            +------------------+--------+--------+-------+  Distal             115      60            +------------------+--------+--------+-------+   +-----------------+--------+--------+-------+  Left Renal ArteryPSV cm/sEDV cm/sComment  +-----------------+--------+--------+-------+  Origin             64      9             +-----------------+--------+--------+-------+  Proximal           80      11            +-----------------+--------+--------+-------+  Mid                39      16            +-----------------+--------+--------+-------+  Distal             46      21            +-----------------+--------+--------+-------+    +------------+--------+--------+--+-----------+--------+--------+---+  Right KidneyPSV cm/sEDV cm/sRILeft KidneyPSV cm/sEDV cm/sRI   +------------+--------+--------+--+-----------+--------+--------+---+  Upper Pole                    Upper Pole                      +------------+--------+--------+--+-----------+--------+--------+---+  Mid                          Mid                             +------------+--------+--------+--+-----------+--------+--------+---+  Lower Pole                    Lower Pole                      +------------+--------+--------+--+-----------+--------+--------+---+  Hilar                        Hilar                           +------------+--------+--------+--+-----------+--------+--------+---+   +------------------+-------+------------------+-------+  Right Kidney             Left Kidney                +------------------+-------+------------------+-------+  RAR                     RAR                        +------------------+-------+------------------+-------+  RAR (manual)             RAR (manual)               +------------------+-------+------------------+-------+  Cortex           0.71   Cortex            0.95     +------------------+-------+------------------+-------+  Cortex thickness  0.95 mmCorex thickness   0.74 mm  +------------------+-------+------------------+-------+  Kidney length (cm)8.50   Kidney length (cm)9.08     +------------------+-------+------------------+-------+     Summary:  Renal:    Right: Abnormal size for the right kidney. The renal artery appears         patent, limited visability.  Left:  Normal size of left kidney. The renal artery stent is not         well visualized. Increased velocity noted in what appears to         be the stent in the 60 - 99% stenosis range.           NOTE: Suboptimal exam, further imaging may be warranted.          Assessment/Plan:    67 year old female with previous history of left renal artery stenting for hypertensive urgency and chronic hypertension on 4 medications.  She attempted to wean her medications but is now back on the amlodipine which was initially taken off.  She is requesting a second opinion for antihypertensive regimen I will make referral to cardiology.  I discussed that she may require repeat angiography to evaluate the stent although it did appear patent by recent noncontrasted CT scan certainly there may be some impingement the duplex was difficult to visualize the stent.  I will have her follow up in 4 months  with repeat renal artery duplex after she is evaluated by cardiology.     Maeola Harman MD [facility]

## 2022-11-09 ENCOUNTER — Encounter (HOSPITAL_BASED_OUTPATIENT_CLINIC_OR_DEPARTMENT_OTHER): Payer: Self-pay | Admitting: Cardiovascular Disease

## 2022-11-09 ENCOUNTER — Ambulatory Visit (INDEPENDENT_AMBULATORY_CARE_PROVIDER_SITE_OTHER): Payer: Medicare Other | Admitting: Cardiovascular Disease

## 2022-11-09 VITALS — BP 179/77 | HR 84 | Ht 64.0 in | Wt 168.0 lb

## 2022-11-09 DIAGNOSIS — I10 Essential (primary) hypertension: Secondary | ICD-10-CM

## 2022-11-09 DIAGNOSIS — R400 Somnolence: Secondary | ICD-10-CM

## 2022-11-09 DIAGNOSIS — Z006 Encounter for examination for normal comparison and control in clinical research program: Secondary | ICD-10-CM

## 2022-11-09 DIAGNOSIS — R0683 Snoring: Secondary | ICD-10-CM | POA: Diagnosis not present

## 2022-11-09 DIAGNOSIS — I701 Atherosclerosis of renal artery: Secondary | ICD-10-CM

## 2022-11-09 DIAGNOSIS — E7800 Pure hypercholesterolemia, unspecified: Secondary | ICD-10-CM

## 2022-11-09 DIAGNOSIS — I471 Supraventricular tachycardia, unspecified: Secondary | ICD-10-CM

## 2022-11-09 DIAGNOSIS — R0602 Shortness of breath: Secondary | ICD-10-CM

## 2022-11-09 DIAGNOSIS — I2510 Atherosclerotic heart disease of native coronary artery without angina pectoris: Secondary | ICD-10-CM

## 2022-11-09 MED ORDER — CLONIDINE HCL 0.2 MG PO TABS: 0.2000 mg | ORAL_TABLET | Freq: Two times a day (BID) | ORAL | 3 refills | Status: DC

## 2022-11-09 MED ORDER — CARVEDILOL 25 MG PO TABS: 25.0000 mg | ORAL_TABLET | Freq: Two times a day (BID) | ORAL | 3 refills | Status: DC

## 2022-11-09 NOTE — Patient Instructions (Addendum)
Medication Instructions:  STOP METOPROLOL   START CARVEDILOL 25 MG TWICE A DAY  INCREASE CLONIDINE TO 0.2 MG TWICE A DAY    Labwork: RENIN/ALDOSTERONE/CATACHOLAMINES/METANEPHRINE SOON     Testing/Procedures: [facility] HOME SLEEP STUDY  WILL CALL YOU WITH CODE ONCE INSURANCE HAS BEEN REVIEWED   Your physician has requested that you have an echocardiogram. Echocardiography is a painless test that uses sound waves to create images of your heart. It provides your doctor with information about the size and shape of your heart and how well your heart's chambers and valves are working. This procedure takes approximately one hour. There are no restrictions for this procedure. Please do NOT wear cologne, perfume, aftershave, or lotions (deodorant is allowed). Please arrive 15 minutes prior to your appointment time.  Follow-Up: 01/07/2023 2:00 pm With Pharm D   03/09/2023 11:00 am with Dr Duke Salvia    Special Instructions:  MONITOR YOUR BLOOD PRESSURE TWICE  WITH MACHINE PROVIDED. MAKE SURE YOU ARE LOGGED INTO YOUR VIVIFY APP --  WatchPAT?  Is a FDA cleared portable home sleep study test that uses a watch and 3 points of contact to monitor 7 different channels, including your heart rate, oxygen saturations, body position, snoring, and chest motion.  The study is easy to use from the comfort of your own home and accurately detect sleep apnea.  Before bed, you attach the chest sensor, attached the sleep apnea bracelet to your nondominant hand, and attach the finger probe.  After the study, the raw data is downloaded from the watch and scored for apnea events.   For more information: [email or web address]  Patient Testing Instructions:  Do not put battery into the device until bedtime when you are ready to begin the test. Please call the support number if you need assistance after following the instructions below: 24 hour support line- [phone] or [facility] support at [phone]  (option 2)  Download the IntelWatchPAT One" app through the google play store or App Store  Be sure to turn on or enable access to bluetooth in settlings on your smartphone/ device  Make sure no other bluetooth devices are on and within the vicinity of your smartphone/ device and WatchPAT watch during testing.  Make sure to leave your smart phone/ device plugged in and charging all night.  When ready for bed:  Follow the instructions step by step in the WatchPAT One App to activate the testing device. For additional instructions, including video instruction, visit the WatchPAT One video on Youtube. You can search for WatchPat One within Youtube (video is 4 minutes and 18 seconds) or enter: https://youtube/watch?[email or web address]_vbiwxE Please note: You will be prompted to enter a Pin to connect via bluetooth when starting the test. The PIN will be assigned to you when you receive the test.  The device is disposable, but it recommended that you retain the device until you receive a call letting you know the study has been received and the results have been interpreted.  We will let you know if the study did not transmit to [place] properly after the test is completed. You do not need to call us to confirm the receipt of the test.  Please complete the test within 48 hours of receiving PIN.   Frequently Asked Questions:  What is Watch Dennie Bible one?  A single use fully disposable home sleep apnea testing device and will not need to be returned after completion.  What are the requirements to use WatchPAT one?  The be able to have  a successful watchpat one sleep study, you should have your Watch pat one device, your smart phone, watch pat one app, your PIN number and Internet access What type of phone do I need?  You should have a smart phone that uses Android 5.1 and above or any Iphone with IOS 10 and above How can I download the WatchPAT one app?  Based on your device type search for WatchPAT one app either in google  play for android devices or APP store for Iphone's Where will I get my PIN for the study?  Your PIN will be provided by your physician's office. It is used for authentication and if you lose/forget your PIN, please reach out to your providers office.  I do not have Internet at home. Can I do WatchPAT one study?  WatchPAT One needs Internet connection throughout the night to be able to transmit the sleep data. You can use your home/local internet or your cellular's data package. However, it is always recommended to use home/local Internet. It is estimated that between 20MB-30MB will be used with each study.However, the application will be looking for space in the phone to start the study.  What happens if I lose internet or bluetooth connection?  During the internet disconnection, your phone will not be able to transmit the sleep data. All the data, will be stored in your phone. As soon as the internet connection is back on, the phone will being sending the sleep data. During the bluetooth disconnection, WatchPAT one will not be able to to send the sleep data to your phone. Data will be kept in the [facility] one until two devices have bluetooth connection back on. As soon as the connection is back on, WatchPAT one will send the sleep data to the phone.  How long do I need to wear the WatchPAT one?  After you start the study, you should wear the device at least 6 hours.  How far should I keep my phone from the device?  During the night, your phone should be within 15 feet.  What happens if I leave the room for restroom or other reasons?  Leaving the room for any reason will not cause any problem. As soon as your get back to the room, both devices will reconnect and will continue to send the sleep data. Can I use my phone during the sleep study?  Yes, you can use your phone as usual during the study. But it is recommended to put your watchpat one on when you are ready to go to bed.  How will I get  my study results?  A soon as you completed your study, your sleep data will be sent to the provider. They will then share the results with you when they are ready.

## 2022-11-09 NOTE — Addendum Note (Signed)
Addended by: Sharyl Nimrod on: 11/09/2022 06:27 PM   Modules accepted: Orders

## 2022-11-09 NOTE — Progress Notes (Signed)
Advanced Hypertension Clinic Initial Assessment:    Date:  11/09/2022   ID:  Karen Jimenez, DOB 10-Nov-1955, MRN [id number]  PCP:  Shon Hale, MD  Cardiologist:  None  Nephrologist: Dr. Signe Colt  Referring MD: Vicki Mallet*   CC: Hypertension  History of Present Illness:    Karen Jimenez is a 67 y.o. female with a hx of hypertension, renal artery stenosis status post left renal artery stenting, CKD 3B, hyperlipidemia, prior tobacco abuse, here to establish care in the Advanced Hypertension Clinic.   She underwent stenting of the left renal artery 02/2022.  Renal artery Dopplers 03/2022 revealed 1 to 59% stenosis bilaterally.  Unfortunately she has not been able to wean as many medications as she had hoped.  She was initially taken off amlodipine but is now back on clonidine, metoprolol, and amlodipine.  She continues to remain off of spironolactone.  She followed up with Dr. Pascal Lux last week and requested referral to the advanced hypertension clinic.  He noted that though her stent appeared patent on a recent noncontrast CT he recommended repeating a renal artery Dopplers to ensure that the stent was patent.  She was seen in the ED for hypertensive urgency 10/29/2022.  Home blood pressures were around 211 systolic.  Ms. Kist notes that her blood pressure has been poorly managed in the past, and she believes her kidney issues may have resulted from the untreated hypertension. She has been under the care of a nephrologist for the last two years and has experienced medication changes due to side effects such as high potassium from spironolactone and swelling from amlodipine.  She has been off spironolactone since the end of February due to high potassium levels and has experienced swelling since resuming amlodipine. She is also taking furosemide as a diuretic, but it does not seem to be helping with the swelling now that she is back on amlodipine.  The patient describes  her blood pressure readings as being consistently high, with recent measurements ranging from 170 to 195 mmHg. She expresses concern about engaging in physical activity due to her elevated blood pressure. She reports that her diet consists of eating out frequently and not focusing on specific dietary restrictions. She consumes two cups of coffee daily and does not drink alcohol or use over-the-counter pain medications regularly.  Her husband reports that she snores, but he has not observed any breathing difficulties during sleep. The patient experiences alternating days of feeling well-rested and fatigued upon waking. She notes that her energy levels can vary throughout the day, with some days being able to complete tasks easily and other days requiring breaks to catch her breath.  Previous antihypertensives: Hydralazine Losartan Spironolactone- hyperkalemia   Past Medical History:  Diagnosis Date   Anxiety 12/29/2017   Benign hypertension with chronic kidney disease, stage III (HCC) 02/25/2020   History of smoking    Hx of hyperlipidemia    Hyperlipidemia    Hypertension    Hyponatremia 02/13/2020   Left renal artery stenosis (HCC) 02/13/2020   Low back pain 04/18/2020   Prediabetes 01/21/2017   SVT (supraventricular tachycardia) 01/21/2017   Vitamin D deficiency disease 01/21/2017    Past Surgical History:  Procedure Laterality Date   APPENDECTOMY     KNEE ARTHROSCOPY Right 2011, 2012   MEDIAL PARTIAL KNEE REPLACEMENT  11/2010   PERIPHERAL VASCULAR INTERVENTION  02/15/2022   Procedure: PERIPHERAL VASCULAR INTERVENTION;  Surgeon: Maeola Harman, MD;  Location: [facility] INVASIVE CV LAB;  Service: Cardiovascular;;  LT Renal   RENAL ANGIOGRAPHY N/A 02/15/2022   Procedure: RENAL ANGIOGRAPHY;  Surgeon: Maeola Harman, MD;  Location: [facility] INVASIVE CV LAB;  Service: Cardiovascular;  Laterality: N/A;   TONSILLECTOMY AND ADENOIDECTOMY      Current Medications: Current Meds   Medication Sig   amLODipine (NORVASC) 10 MG tablet Take 10 mg by mouth daily.   aspirin EC 81 MG tablet Take 81 mg by mouth daily. Swallow whole.   cloNIDine (CATAPRES) 0.1 MG tablet Take 0.1 mg by mouth 2 (two) times daily.   furosemide (LASIX) 40 MG tablet Take 40 mg by mouth daily.   metoprolol succinate (TOPROL-XL) 50 MG 24 hr tablet Take 50 mg by mouth daily.   rosuvastatin (CRESTOR) 10 MG tablet Take 1 tablet (10 mg total) by mouth daily.     Allergies:   Amoxicillin, Losartan, Pseudoephedrine hcl, Hydralazine, Hydrocodone, Hydrocodone-acetaminophen, and Nitrofurantoin   Social History   Socioeconomic History   Marital status: Married    Spouse name: Not on file   Number of children: 2   Years of education: Not on file   Highest education level: Not on file  Occupational History    Employer: [employer]  Tobacco Use   Smoking status: Former    Packs/day: 0.50    Years: 30.00    Additional pack years: 0.00    Total pack years: 15.00    Types: Cigarettes   Smokeless tobacco: Never  Vaping Use   Vaping Use: Never used  Substance and Sexual Activity   Alcohol use: No   Drug use: No   Sexual activity: Never    Partners: Male    Birth control/protection: Post-menopausal  Other Topics Concern   Not on file  Social History Narrative   Not on file   Social Determinants of Health   Financial Resource Strain: Not on file  Food Insecurity: No Food Insecurity (06/27/2022)   Hunger Vital Sign    Worried About Running Out of Food in the Last Year: Never true    Ran Out of Food in the Last Year: Never true  Transportation Needs: No Transportation Needs (06/27/2022)   PRAPARE - Administrator, Civil Service (Medical): No    Lack of Transportation (Non-Medical): No  Physical Activity: Inactive (11/09/2022)   Exercise Vital Sign    Days of Exercise per Week: 0 days    Minutes of Exercise per Session: 0 min  Stress: Not on file  Social Connections: Not on file      Family History: The patient's family history includes Colon cancer (age of onset: 65) in her mother; Lung cancer (age of onset: 33) in her father.  ROS:   Please see the history of present illness.     All other systems reviewed and are negative.  EKGs/Labs/Other Studies Reviewed:    EKG:  EKG is not ordered today.    Recent Labs: 06/27/2022: ALT 25 06/28/2022: BUN 36; Creatinine, Ser 2.79; Hemoglobin 11.1; Platelets 274; Potassium 4.6; Sodium 133   Recent Lipid Panel No results found for: "CHOL", "TRIG", "HDL", "CHOLHDL", "VLDL", "LDLCALC", "LDLDIRECT"  Physical Exam:   VS:  BP (!) 179/77 (BP Location: Right Arm, Patient Position: Sitting, Cuff Size: Large)   Pulse 84   Ht 5\' 4"  (1.626 m)   Wt 168 lb (76.2 kg)   LMP 04/11/2009 (Within Days)   SpO2 97%   BMI 28.84 kg/m  , BMI Body mass index is 28.84 kg/m. GENERAL:  Well appearing HEENT: Pupils equal round and reactive, fundi not visualized, oral mucosa unremarkable NECK:  No jugular venous distention, waveform within normal limits, carotid upstroke brisk and symmetric, no bruits, no thyromegaly LUNGS:  Clear to auscultation bilaterally HEART:  RRR.  PMI not displaced or sustained,S1 and S2 within normal limits, no S3, no S4, no clicks, no rubs, no murmurs ABD:  Flat, positive bowel sounds normal in frequency in pitch, no bruits, no rebound, no guarding, no midline pulsatile mass, no hepatomegaly, no splenomegaly EXT:  2 plus pulses throughout, no edema, no cyanosis no clubbing SKIN:  No rashes no nodules NEURO:  Cranial nerves II through XII grossly intact, motor grossly intact throughout PSYCH:  Cognitively intact, oriented to person place and time   ASSESSMENT/PLAN:    No problem-specific Assessment & Plan notes found for this encounter.  # Uncontrolled HTN: - Discontinue Metoprolol - Initiate Carvedilol 25 mg twice a day - Increase Clonidine to 0.2 mg twice a day - Schedule follow-up appointment in one month  to reassess blood pressure control - Check renin, aldosterone, catecholamines and metanephrines - She consents to enrollment in our remote patient monitoring study and using the Vivify RPM system  # Renal artery stenosis: - Continue follow-up with nephrologist (Dr. Signe Colt) and Dr. Randie Heinz. - Renal Doppler 10/2022 concerning for in stent stenosis.  She continues to follow with Dr. Randie Heinz and will have  a repeat Doppler.  # LE Edema: - Continue Amlodipine for now.  Will attempt to wean once BP control improves.  - Continue furosemide - Encourage use of compression stockings during daytime - Advise elevating legs when possible - She also notes shortness of breath.  Will check echo.  # Fatigue and possible sleep apnea: - Order Itamar home sleep study to rule out sleep apnea - Monitor for changes in fatigue with adjustment of blood pressure medications  Screening for Secondary Hypertension:     Relevant Labs/Studies:    Latest Ref Rng & Units 06/28/2022    9:20 AM 06/27/2022    5:15 AM 02/15/2022    9:21 AM  Basic Labs  Sodium 135 - 145 mmol/L 133  129  133   Potassium 3.5 - 5.1 mmol/L 4.6  5.6  5.7   Creatinine 0.44 - 1.00 mg/dL 1.30  8.65  7.84                    11/01/2022   11:22 AM  Renovascular   Renal Artery [place] Completed Yes    Disposition:    FU with MD/PharmD in 1 month    Medication Adjustments/Labs and Tests Ordered: Current medicines are reviewed at length with the patient today.  Concerns regarding medicines are outlined above.  No orders of the defined types were placed in this encounter.  No orders of the defined types were placed in this encounter.    Signed, Chilton Si, MD  11/09/2022 10:54 AM    [facility]

## 2022-11-09 NOTE — Research (Signed)
  Subject Name: Karen Jimenez met inclusion and exclusion criteria for the Virtual Care and Social Determinant Interventions for the management of hypertension trial.  The informed consent form, study requirements and expectations were reviewed with the subject by Dr. Duke Salvia and myself. The subject was given the opportunity to read the consent and ask questions. The subject verbalized understanding of the trial requirements.  All questions were addressed prior to the signing of the consent form. The subject agreed to participate in the trial and signed the informed consent. The informed consent was obtained prior to performance of any protocol-specific procedures for the subject.  A copy of the signed informed consent was given to the subject and a copy was placed in the subject's medical record.  [name] was randomized to Group 2.

## 2022-11-10 ENCOUNTER — Telehealth: Payer: Self-pay

## 2022-11-10 ENCOUNTER — Other Ambulatory Visit: Payer: Self-pay

## 2022-11-10 DIAGNOSIS — Z0000 Encounter for general adult medical examination without abnormal findings: Secondary | ICD-10-CM

## 2022-11-10 DIAGNOSIS — I701 Atherosclerosis of renal artery: Secondary | ICD-10-CM

## 2022-11-10 NOTE — Telephone Encounter (Signed)
Called patient to conduct welcome call and to offer health coaching for improving eating habits and increasing physical activity. Patient did not have any additional questions. We did discuss prompt times and they are working for her schedule at this time. Patient is interested in health coaching for eating habits around monitoring sodium intake but want to focus on get use to the routine of checking her bp and new medication regimen. Patient was advised by Dr. Duke Salvia not to engage in exercise at this time. Provided patient my contact information via chat as stated during conversation.    Renaee Munda, MS, ERHD, [facility]  Care Guide, Health & Wellness Coach [address] [place] [address] Telephone: [phone] Email: [email or web address]

## 2022-11-16 ENCOUNTER — Telehealth: Payer: Self-pay | Admitting: Cardiovascular Disease

## 2022-11-16 NOTE — Telephone Encounter (Signed)
Can someone check status of prior auth on this itamar please

## 2022-11-16 NOTE — Telephone Encounter (Signed)
Pt stated she had a office visit on 11/09/2022 and was given the sleep apnea test but never received a code. Nobody has contacted her yet and she's requesting a callback to see what's going on. Please advise

## 2022-11-16 NOTE — Telephone Encounter (Signed)
Returned call to patient,   Patient states when she was here on 4/10 she presented with swelling. She is still having issues with swelling and she knows this is from her amlodipine. She states her blood pressure has been much better. She states she and Dr. Duke Salvia discussed potentially coming off the amlodipine if her BP was looking better. She states she is going out of town next week and would prefer to not have swollen feet for her trip. BP can be reviewed in Vivifiy as patient is in research study.   Advised patient Dr. Duke Salvia out of the office this week, but would send the information to her and the pharmacy staff for review.

## 2022-11-16 NOTE — Telephone Encounter (Signed)
Pt c/o medication issue:  1. Name of Medication: amLODipine (NORVASC) 10 MG tablet   2. How are you currently taking this medication (dosage and times per day)? Take 10 mg by mouth daily.   3. Are you having a reaction (difficulty breathing--STAT)? No  4. What is your medication issue? Pt stated she'd like a call back to let her know if she can stop taking this above medication. Please advise

## 2022-11-18 NOTE — Telephone Encounter (Signed)
Apologized to patient Dr Duke Salvia at hospital and Roney Marion D out of the office as well   Will forward to Marisue Humble D for review

## 2022-11-18 NOTE — Telephone Encounter (Signed)
Hesitant to discontinue Amlodipine completely. Often lower doses of Amlodipine do not cause swelling. May reduce Amlodipine to 5mg  QD. Monitor BP for 2 weeks and thereafter can reassess swelling and if additional medication changes needed. General recommendations for swelling below.  To prevent or reduce lower extremity swelling: Eat a low salt diet. Salt makes the body hold onto extra fluid which causes swelling. Sit with legs elevated. For example, in the recliner or on an ottoman.  Wear knee-high compression stockings during the daytime. Ones labeled 15-20 mmHg provide good compression.    Alver Sorrow, NP

## 2022-11-18 NOTE — Telephone Encounter (Signed)
Patient is calling to follow up

## 2022-11-18 NOTE — Telephone Encounter (Signed)
Advised patient, verbalized understanding  

## 2022-11-19 LAB — ALDOSTERONE + RENIN ACTIVITY W/ RATIO
Aldos/Renin Ratio: 1.4 (ref 0.0–30.0)
Aldosterone: 15.7 ng/dL (ref 0.0–30.0)
Renin Activity, Plasma: 11.369 ng/mL/hr — ABNORMAL HIGH (ref 0.167–5.380)

## 2022-11-19 LAB — METANEPHRINES, PLASMA
Metanephrine, Free: 40.6 pg/mL (ref 0.0–88.0)
Normetanephrine, Free: 220.9 pg/mL (ref 0.0–285.2)

## 2022-11-19 LAB — CATECHOLAMINES, FRACTIONATED, PLASMA
Dopamine: <30 pg/mL (ref 0–48)
Epinephrine: 46 pg/mL (ref 0–62)
Norepinephrine: 1298 pg/mL — ABNORMAL HIGH (ref 0–874)

## 2022-12-03 NOTE — Telephone Encounter (Signed)
Called patient, code given to patient. She verbalizes understanding!

## 2022-12-03 NOTE — Telephone Encounter (Signed)
**Note De-Identified Bladyn Tipps Obfuscation** 5/24-READY-No PA for Itamar req.

## 2022-12-09 ENCOUNTER — Telehealth: Payer: Self-pay | Admitting: Cardiovascular Disease

## 2022-12-09 DIAGNOSIS — I10 Essential (primary) hypertension: Secondary | ICD-10-CM

## 2022-12-09 NOTE — Telephone Encounter (Signed)
Returned call to patient, advised her that we do not have labs at this time but once we get them we will get them over for review.   She states she started breaking her amlodipine in half based on the last time she called due to swelling, she completely stopped the amlodipine on 5/25. She notes some improvement in the swelling, but states that they are now swollen again and they hurt. She states there is no discoloration or heat to the touch. Asked about her pressures since stopping the medication. She says they were fine until last night it started becoming elevated again. Bps in vivify for review. She states she just cannot take it anymore and something has to be done about the swelling as it is very uncomfortable.

## 2022-12-09 NOTE — Telephone Encounter (Signed)
Returned call to patient, reviewed the following  recommendations with the patient. She will continue to stay off amlodipine. Reminder set to check vivify blood pressures in one week! Patient will keep her echo appointment.         "Labs viewed via [facility] DXA dated 12/02/22: Creatinine 2.51, BUN 38, GFR 21, K4.4.  Kidney function decreased on previous.  Continue to follow nephrology.  Thankfully none of her present antihypertensive agents will affect kidney function.  She has echocardiogram upcoming to assess for the cause of her swelling.  May discontinue amlodipine if she wishes the low suspicion the low-dose was significantly contributory to swelling.   She may either... 1. Monitor BP on Carvedilol 25mg  BID and Clonidine 0.2mg  BID without Amlodipine for one week and check in thereafter 2. Increase Clonidine to 0.3mg  BID and continue Carvedilol 25mg  BID.    Alver Sorrow, NP"

## 2022-12-09 NOTE — Telephone Encounter (Signed)
Labs viewed via [facility] dated 12/02/22: Creatinine 2.51, BUN 38, GFR 21, K4.4.  Kidney function decreased on previous.  Continue to follow nephrology.  Thankfully none of her present antihypertensive agents will affect kidney function.  She has echocardiogram upcoming to assess for the cause of her swelling.  May discontinue amlodipine if she wishes the low suspicion the low-dose was significantly contributory to swelling.  She may either... Monitor BP on Carvedilol 25mg  BID and Clonidine 0.2mg  BID without Amlodipine for one week and check in thereafter Increase Clonidine to 0.3mg  BID and continue Carvedilol 25mg  BID.   Alver Sorrow, NP

## 2022-12-09 NOTE — Telephone Encounter (Signed)
Patient is calling to see if Dr. Duke Jimenez could look over labs from her kidney doctor from [facility]. She states these were suppose to be faxed over. Pt would like to discuss these with her cardiologist and see if there are any changes with current results.

## 2022-12-13 ENCOUNTER — Ambulatory Visit (INDEPENDENT_AMBULATORY_CARE_PROVIDER_SITE_OTHER): Payer: Medicare Other

## 2022-12-13 DIAGNOSIS — R600 Localized edema: Secondary | ICD-10-CM | POA: Diagnosis not present

## 2022-12-13 DIAGNOSIS — I10 Essential (primary) hypertension: Secondary | ICD-10-CM

## 2022-12-13 DIAGNOSIS — R0602 Shortness of breath: Secondary | ICD-10-CM | POA: Diagnosis not present

## 2022-12-13 LAB — ECHOCARDIOGRAM COMPLETE
AR max vel: 1.64 cm2
AV Area VTI: 2.01 cm2
AV Area mean vel: 1.33 cm2
AV Mean grad: 3.5 mmHg
AV Peak grad: 6.4 mmHg
Ao pk vel: 1.26 m/s
Area-P 1/2: 2.94 cm2
Calc EF: 58.6 %
S' Lateral: 2.84 cm
Single Plane A2C EF: 64.2 %
Single Plane A4C EF: 50 %

## 2022-12-15 ENCOUNTER — Telehealth (HOSPITAL_BASED_OUTPATIENT_CLINIC_OR_DEPARTMENT_OTHER): Payer: Self-pay | Admitting: *Deleted

## 2022-12-15 DIAGNOSIS — I10 Essential (primary) hypertension: Secondary | ICD-10-CM

## 2022-12-15 DIAGNOSIS — R899 Unspecified abnormal finding in specimens from other organs, systems and tissues: Secondary | ICD-10-CM

## 2022-12-15 NOTE — Telephone Encounter (Signed)
Advised patient, verbalized understanding  

## 2022-12-15 NOTE — Telephone Encounter (Signed)
-----   Message from Chilton Si, MD sent at 12/15/2022  9:18 AM EDT ----- Labs are not consistent with hyperaldosteronism.  Normetanephrine levels are elevated but can be falsely positive on blood testing.  Recommend 24-hour urine collection for catecholamines collected on ice.

## 2022-12-16 ENCOUNTER — Institutional Professional Consult (permissible substitution) (HOSPITAL_BASED_OUTPATIENT_CLINIC_OR_DEPARTMENT_OTHER): Payer: Medicare Other | Admitting: Family

## 2022-12-16 MED ORDER — CLONIDINE HCL 0.1 MG PO TABS: 0.1000 mg | ORAL_TABLET | Freq: Two times a day (BID) | ORAL | 11 refills | Status: DC

## 2022-12-16 NOTE — Addendum Note (Signed)
Addended by: Marlene Lard on: 12/16/2022 12:08 PM   Modules accepted: Orders

## 2022-12-16 NOTE — Telephone Encounter (Signed)
Reminder set for today to have provider follow up on BP in vivify for any improvement since last phone conversation, will route to provider who provided recommendations for last call.

## 2022-12-16 NOTE — Telephone Encounter (Signed)
Average BP over the last week 144/77.  BP noted to be elevated particular in the evenings.  Recommend increase clonidine to 0.3 mg twice daily.  Continue present dose carvedilol.  Alver Sorrow, NP

## 2022-12-16 NOTE — Telephone Encounter (Signed)
Returned call to patient, she verbalized understanding and is agreeable to taking to finishing her 0.2mg  tablets in combination with a 0.1mg  tablet to equal 0.3mg  BID. Rx to pharm.     "Average BP over the last week 144/77.  BP noted to be elevated particular in the evenings.  Recommend increase clonidine to 0.3 mg twice daily.  Continue present dose carvedilol.   Alver Sorrow, NP"

## 2022-12-20 ENCOUNTER — Telehealth (HOSPITAL_BASED_OUTPATIENT_CLINIC_OR_DEPARTMENT_OTHER): Payer: Self-pay

## 2022-12-20 ENCOUNTER — Telehealth: Payer: Self-pay

## 2022-12-20 DIAGNOSIS — Z006 Encounter for examination for normal comparison and control in clinical research program: Secondary | ICD-10-CM

## 2022-12-20 NOTE — Telephone Encounter (Signed)
Unfortunately Clonidine when taken just once daily can cause rebound hypertension and higher  blood pressures. Can try Clonidine 0.3mg  in the evening and 0.2mg  in the morning to see if that helps symptoms. May take a bit to get adjusted to the new dose. If persistently fatigued even after adjusting 0.3mg  evening and 0.2mg  AM we can consider additional changes.   Alver Sorrow, NP

## 2022-12-20 NOTE — Telephone Encounter (Signed)
Called patient as requested via secure chat,   She states the clonidine 0.3mg , she states feels like she is a zombie. She states she slept all weekend and just couldn't function. She states it is always right after she takes her medications. She states her blood pressures were seeming to be better in the morning. She states they haven't gone anywhere near since 150 since the increase.   She wonders if she could try the clonidine just once per day?

## 2022-12-20 NOTE — Telephone Encounter (Signed)
Spoke with pt about her Vivify b/p blood pressure device. Pt stated her device would not stay connected. I told pt Vivify did an update in their system and the b/p devices were not staying connected since this update. I told pt she can continue entering her b/p in the app manually.

## 2022-12-20 NOTE — Telephone Encounter (Signed)
Returned call to patient and reviewed the following recommendations with the patient. She is agreeable to the change and will keep [place] updated if symptoms do not improve.     "Unfortunately Clonidine when taken just once daily can cause rebound hypertension and higher  blood pressures. Can try Clonidine 0.3mg  in the evening and 0.2mg  in the morning to see if that helps symptoms. May take a bit to get adjusted to the new dose. If persistently fatigued even after adjusting 0.3mg  evening and 0.2mg  AM we can consider additional changes.    Alver Sorrow, NP"

## 2022-12-27 ENCOUNTER — Telehealth: Payer: Self-pay

## 2022-12-27 DIAGNOSIS — Z0000 Encounter for general adult medical examination without abnormal findings: Secondary | ICD-10-CM

## 2022-12-27 NOTE — Telephone Encounter (Signed)
Called patient to follow up regarding interest in participating in health coaching to improve healthy eating habits. Patient expressed interest and has been scheduled for her initial session on 6/28 at 10:00am over the phone.   Renaee Munda, MS, ERHD, [facility]  Care Guide, Health & Wellness Coach [address] [place] [address] Telephone: [phone] Email: [email or web address]

## 2023-01-04 LAB — CATECHOLAMINES, FRACTIONATED, URINE, 24 HOUR
Dopamine , 24H Ur: 59 ug/24 hr (ref 0–510)
Dopamine, Rand Ur: 22 ug/L
Epinephrine, 24H Ur: <8 ug/24 hr (ref 0–20)
Epinephrine, Rand Ur: <3 ug/L
Norepinephrine, 24H Ur: <41 ug/24 hr (ref 0–135)
Norepinephrine, Rand Ur: <15 ug/L

## 2023-01-07 ENCOUNTER — Ambulatory Visit (INDEPENDENT_AMBULATORY_CARE_PROVIDER_SITE_OTHER): Payer: Medicare Other | Admitting: Pharmacist Clinician (PhC)/ Clinical Pharmacy Specialist

## 2023-01-07 ENCOUNTER — Ambulatory Visit: Payer: Medicare Other

## 2023-01-07 ENCOUNTER — Encounter (HOSPITAL_BASED_OUTPATIENT_CLINIC_OR_DEPARTMENT_OTHER): Payer: Self-pay | Admitting: Pharmacist Clinician (PhC)/ Clinical Pharmacy Specialist

## 2023-01-07 ENCOUNTER — Telehealth: Payer: Self-pay

## 2023-01-07 ENCOUNTER — Encounter: Payer: Self-pay | Admitting: Cardiovascular Disease

## 2023-01-07 VITALS — BP 193/79 | HR 72

## 2023-01-07 DIAGNOSIS — I10 Essential (primary) hypertension: Secondary | ICD-10-CM | POA: Diagnosis not present

## 2023-01-07 DIAGNOSIS — Z0000 Encounter for general adult medical examination without abnormal findings: Secondary | ICD-10-CM

## 2023-01-07 MED ORDER — CLONIDINE 0.2 MG/24HR TD PTWK: 0.2000 mg | MEDICATED_PATCH | TRANSDERMAL | 12 refills | Status: DC

## 2023-01-07 NOTE — Assessment & Plan Note (Addendum)
Assessment: BP is uncontrolled in office BP 190/74 mmHg;  above the goal (<130/80).  Home average on Vivify 148/80 for past month Tolerates carvedilol and clonidine well without any side effects 0.3 mg dose of clonidine in am causes some drowsiness Reiterated the importance of regular exercise and low salt diet   Plan:  Start clonidine 0.2 mg patch - apply weekly; on day 1 take full clonidine dose, on day 2 take 0.1 mg in am and 0.2 mg hs; day 3 take 0.1 mg in am.    Continue taking carvedilol Patient to keep record of BP readings with heart rate and report to [place] at the next visit Patient to follow up with PharmD in 1 month  Labs ordered today:  none Asked that she review BP readings with nephrology - stopped spironolactone 2/2 elevated potassium, per chart was at 100 mg daily, wonder if they would be willing to try 12.5 mg if further BP management is required Okay to use treadmill, advised to start with +/- 10 minutes/day and work up from there on time and speed

## 2023-01-07 NOTE — Progress Notes (Unsigned)
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 female patient who was referred to the [facility] by Dr. Lemar Livings.  She had a left renal artery stent placed in 2023, but has not been able to wean off medications as she had hoped.  When Dr. Duke Salvia saw her in April she switched the metoprolol to carvedilol 25 mg twice daily and increased the clonidine to 0.2 mg twice daily.  Patient was agreeable to join our RMP study and was randomized to group 2.   Since that appointment patient had concerns for LEE and the amlodipine was decreased to 5 mg, then stopped.  Clonidine was increased to 0.3 mg twice daily, however patient later reported that the dose makes her feel like a "zombie" in the mornings.   She returns today for follow up.  She stopped the amlodipine on May 24 because of lower extremity edema and notes that it took close to 3 weeks for the edema to resolve.  Her GFR is at 18, which severely limits our medication options.  Chart indicates nausea and rash with hydralazine, although today she cannot recall that medication by name or side effect noted.  She tolerates the clonidine, but cannot take the  0.3 mg dose in the mornings as it causes her to be drowsy.    Blood Pressure Goal:  130/80  Current Medications:  carvedilol 25 mg bid, clonidine 0.2 mg am/0.3 mg hs  Previously tried:   amlodipine - LEE, losartan - AKI, hydralazine - nausea, rash  Family Hx:  parents both had cancers   Social Hx:      Tobacco: former smoker   Alcohol: no  Caffeine:  2 cups daily  Diet:   plenty of fruits and vegetables   Exercise: just purchased treadmill - wanting to know restrictions  Home BP readings:  in Vivify   14 day average 151/82  HR 68  (range 120-167/68-92)  30 day average 148/80  HR 68  (range 120-167/68-94)   Accessory Clinical Findings    Lab Results  Component Value Date   CREATININE 2.79 (H) 06/28/2022   BUN 36 (H) 06/28/2022   NA 133 (L) 06/28/2022   K 4.6 06/28/2022   CL 105 06/28/2022   CO2 19 (L) 06/28/2022   Lab Results  Component Value Date   ALT 25 06/27/2022   AST 28 06/27/2022   ALKPHOS 73 06/27/2022   BILITOT 0.4 06/27/2022   Lab Results  Component Value Date   HGBA1C 6.4 (H) 06/28/2022    Screening for Secondary Hypertension: { Click here to document screening for secondary causes of HTN  :  1}     11/09/2022   11:07 AM  Causes  Drugs/Herbals Screened     - Comments high sodium-eats out, 2 coffees daily, no EtOH.  No NSAIDS  Renovascular HTN Screened     - Comments s/p renal artery stenting  Sleep Apnea Screened     - Comments snores.  some daytime somnolence    Relevant Labs/Studies:    Latest Ref Rng & Units 06/28/2022    9:20 AM 06/27/2022    5:15 AM 02/15/2022    9:21 AM  Basic Labs  Sodium 135 - 145 mmol/L 133  129  133   Potassium 3.5 - 5.1 mmol/L 4.6  5.6  5.7   Creatinine 0.44 - 1.00 mg/dL 5.40  9.81  1.91           Latest Ref Rng & Units 11/11/2022   10:17 AM  Renin/Aldosterone   Aldosterone 0.0 - 30.0 ng/dL 47.8   Aldos/Renin Ratio 0.0 - 30.0 1.4        Latest Ref Rng & Units 11/11/2022   10:17 AM   Metanephrines/Catecholamines   Epinephrine 0 - 62 pg/mL 46   Norepinephrine 0 - 874 pg/mL 1,298   Dopamine 0 - 48 pg/mL <30   Metanephrines 0.0 - 88.0 pg/mL 40.6   Normetanephrines  0.0 - 285.2 pg/mL 220.9           11/10/2022    3:22 PM  Renovascular   Renal Artery [place] Completed Yes      Home Medications    Current Outpatient Medications  Medication Sig Dispense Refill   cloNIDine (CATAPRES - DOSED IN MG/24 HR) 0.2 mg/24hr patch Place 1 patch (0.2 mg total) onto the skin once a week. 4 patch 12   aspirin EC 81 MG tablet Take 81 mg by mouth daily. Swallow whole.     carvedilol (COREG) 25 MG tablet Take 1 tablet (25 mg total) by mouth 2 (two) times daily. 180 tablet 3   furosemide (LASIX) 40 MG tablet Take 40 mg by mouth daily.     rosuvastatin (CRESTOR) 10 MG tablet Take 1 tablet (10 mg total) by mouth daily. 30 tablet 11   No current facility-administered medications for this visit.     Assessment & Plan   HYPERTENSION CONTROL Vitals:   01/07/23 0230 01/07/23 1425  BP: (!) 190/74 (!) 193/79    The patient's blood pressure is elevated above target today. {Click here if intervention needs to be changed Refresh Note :1}  In order to address the patient's elevated BP: A current anti-hypertensive medication was adjusted today.; Blood pressure will be monitored at home to determine if medication changes need to be made.     Hypertension Assessment: BP is uncontrolled in office BP 190/74 mmHg;  above the goal (<130/80).  Home average on Vivify 148/80 for past month Tolerates carvedilol and clonidine well without any side effects 0.3 mg dose of clonidine in am causes some drowsiness Reiterated the importance of regular exercise and low salt diet   Plan:  Start clonidine 0.2 mg patch - apply weekly; on day 1 take full clonidine dose, on day 2 take 0.1 mg in am and 0.2 mg hs; day 3 take 0.1 mg in am.    Continue taking carvedilol Patient to keep record of BP readings with  heart rate and report to [place] at the next visit Patient to follow up with PharmD in 1 month  Labs ordered today:  none Asked that she review BP  readings with nephrology - stopped spironolactone 2/2 elevated potassium, per chart was at 100 mg daily, wonder if they would be willing to try 12.5 mg if further BP management is required Okay to use treadmill, advised to start with +/- 10 minutes/day and work up from there on time and speed    Phillips Hay PharmD CPP [facility]  [address] [place], [place] [address] [phone]

## 2023-01-07 NOTE — Patient Instructions (Signed)
Follow up appointment: Dr. Duke Salvia  Take your BP meds as follows:  Switch clonidine to patches   Day 1 - apply patch and take clonidine 0.2 mg in am and 0.3 mg at night p   Day 2 - take 0.1 mg in the morning and 0.2 mg at night   Day 3 - take 0.1 mg in the morning  Check your blood pressure at home daily (if able) and keep record of the readings.  Hypertension "High blood pressure"  Hypertension is often called "The Silent Killer." It rarely causes symptoms until it is extremely  high or has done damage to other organs in the body. For this reason, you should have your  blood pressure checked regularly by your physician. We will check your blood pressure  every time you see a provider at one of our offices.   Your blood pressure reading consists of two numbers. Ideally, blood pressure should be  below 120/80. The first ("top") number is called the systolic pressure. It measures the  pressure in your arteries as your heart beats. The second ("bottom") number is called the diastolic pressure. It measures the pressure in your arteries as the heart relaxes between beats.  The benefits of getting your blood pressure under control are enormous. A 10-point  reduction in systolic blood pressure can reduce your risk of stroke by 27% and heart failure by 28%  Your blood pressure goal is < 130/80  To check your pressure at home you will need to:  1. Sit up in a chair, with feet flat on the floor and back supported. Do not cross your ankles or legs. 2. Rest your left arm so that the cuff is about heart level. If the cuff goes on your upper arm,  then just relax the arm on the table, arm of the chair or your lap. If you have a wrist cuff, we  suggest relaxing your wrist against your chest (think of it as Pledging the Flag with the  wrong arm).  3. Place the cuff snugly around your arm, about 1 inch above the crook of your elbow. The  cords should be inside the groove of your elbow.  4. Sit  quietly, with the cuff in place, for about 5 minutes. After that 5 minutes press the power  button to start a reading. 5. Do not talk or move while the reading is taking place.  6. Record your readings on a sheet of paper. Although most cuffs have a memory, it is often  easier to see a pattern developing when the numbers are all in front of you.  7. You can repeat the reading after 1-3 minutes if it is recommended  Make sure your bladder is empty and you have not had caffeine or tobacco within the last 30 min  Always bring your blood pressure log with you to your appointments. If you have not brought your monitor in to be double checked for accuracy, please bring it to your next appointment.  You can find a list of quality blood pressure cuffs at validatebp.org

## 2023-01-07 NOTE — Telephone Encounter (Signed)
Called patient to hold health coaching session over the phone. Patient did not answer. Left message for patient to return call to hold session or to reschedule.    Renaee Munda, MS, ERHD, [facility]  Care Guide, Health & Wellness Coach [address], [address] [place] [address] Telephone: [phone] Email: [email or web address]

## 2023-01-10 ENCOUNTER — Telehealth: Payer: Self-pay

## 2023-01-10 DIAGNOSIS — Z0000 Encounter for general adult medical examination without abnormal findings: Secondary | ICD-10-CM

## 2023-01-10 NOTE — Telephone Encounter (Signed)
Called patient to reschedule missed health coaching appointment. Left patient a message to return call to reschedule.   Renaee Munda, MS, ERHD, [facility]  Care Guide, Health & Wellness Coach [address] [place] [address] Telephone: [phone] Email: [email or web address]

## 2023-01-11 ENCOUNTER — Telehealth: Payer: Self-pay | Admitting: Pharmacist Clinician (PhC)/ Clinical Pharmacy Specialist

## 2023-01-11 MED ORDER — CLONIDINE 0.2 MG/24HR TD PTWK: 0.2000 mg | MEDICATED_PATCH | TRANSDERMAL | 12 refills | Status: DC

## 2023-01-11 NOTE — Telephone Encounter (Signed)
Rx transferred to new pharmacy.

## 2023-01-11 NOTE — Telephone Encounter (Signed)
Pt c/o medication issue:  1. Name of Medication:   cloNIDine (CATAPRES - DOSED IN MG/24 HR) 0.2 mg/24hr patch    2. How are you currently taking this medication (dosage and times per day)?   3. Are you having a reaction (difficulty breathing--STAT)?   4. What is your medication issue? Patient states that this medication needs to be changed to:   [id number] - [place], [place] - [address]   Due to need to use GoodRx for pricing.

## 2023-01-17 ENCOUNTER — Telehealth: Payer: Self-pay | Admitting: Cardiovascular Disease

## 2023-01-17 NOTE — Telephone Encounter (Signed)
Pt would like to make provider aware that she no longer wants to meet with the Health coach at this time. She would like a callback regarding this matter.   Pt c/o medication issue:  1. Name of Medication:   cloNIDine (CATAPRES - DOSED IN MG/24 HR) 0.2 mg/24hr patch    2. How are you currently taking this medication (dosage and times per day)? Place 1 patch (0.2 mg total) onto the skin once a week.   3. Are you having a reaction (difficulty breathing--STAT)? No  4. What is your medication issue? Pt would like a callback from pharmacist regarding medication. Pease advise

## 2023-01-17 NOTE — Telephone Encounter (Signed)
Left message to call back  Duke Salvia MD patient

## 2023-01-17 NOTE — Telephone Encounter (Signed)
Returned call to patient,   Patient states she no longer wishes to participate in the health coaching program. She was unhappy that she received a $25 no show bill. Amy from what I can tell she doesn't currently have any further appointments scheduled.   In reference to clonidine. Wednesday will be one week since she started the patch. 228/109 shortly before phone call. She states that her blood pressures seem to be going up since starting the patch and she is worried.

## 2023-01-17 NOTE — Telephone Encounter (Signed)
Our choices for her BP are rather limited 2/2 CrCl at 27 (with SCr at 2.51).  Please ask if the side effects from the clonidine tablets have gone away now that she is on the patch.  Her readings in Vivify look essentially unchanged (the 228/109 was not on the website).  If she goes over 200/100 she can take a clonidine 0.1 mg tablet x 1- limit to once daily.  We may have to go to the 0.3 mg patch, but we started with 0.2 mg to see if it would be tolerated.

## 2023-01-17 NOTE — Telephone Encounter (Signed)
Pt returning call

## 2023-01-18 NOTE — Telephone Encounter (Signed)
I'm having administration look into the $25 no show bill because health coaching patients are not charged any fees associated with my services. I will follow up with the patient once this is resolved.

## 2023-01-18 NOTE — Telephone Encounter (Signed)
Returned call to patient, reviewed the following recommendations with the patient. She states she took an amlodipine last night that she had at home since she panicked about her blood pressure being so high. She said she would follow the recommendations given and report back if things don't improve.     "Our choices for her BP are rather limited 2/2 CrCl at 27 (with SCr at 2.51).  Please ask if the side effects from the clonidine tablets have gone away now that she is on the patch.  Her readings in Vivify look essentially unchanged (the 228/109 was not on the website).  If she goes over 200/100 she can take a clonidine 0.1 mg tablet x 1- limit to once daily.  We may have to go to the 0.3 mg patch, but we started with 0.2 mg to see if it would be tolerated. "

## 2023-01-24 ENCOUNTER — Telehealth (HOSPITAL_BASED_OUTPATIENT_CLINIC_OR_DEPARTMENT_OTHER): Payer: Self-pay

## 2023-01-24 NOTE — Telephone Encounter (Addendum)
Results called to patient who verbalizes understanding!      ----- Message from Alver Sorrow sent at 01/21/2023  5:02 PM EDT ----- Normal catecholamines. Good result!

## 2023-02-15 ENCOUNTER — Telehealth (HOSPITAL_BASED_OUTPATIENT_CLINIC_OR_DEPARTMENT_OTHER): Payer: Self-pay | Admitting: Family

## 2023-02-15 NOTE — Telephone Encounter (Signed)
Typically recommend taking Clonidine 12 hours apart. However, if she wishes to keep schedule as discussed with kidney doctor, simply monitor and will check back in on vivify in a couple weeks.   Alver Sorrow, NP

## 2023-02-15 NOTE — Telephone Encounter (Signed)
Called patient to review the following information, the kidney doctor changed a few things. Probably going to have a stent surgery.   Kidney doctor put her on doxazosin- yesterday increased her two 4mg  am and 4mg  in evening   Clonidine 0.2mg  tablets morning and midday- she states the patch did not work!   Medications added to patient list, consulted with Gillian Shields, Np. Advise patient to take clonidine 12 hours apart. But if she wishes to take as nephrology prescribed we will keep an eye on her blood pressures.   Returned call to patient with about updates.

## 2023-02-15 NOTE — Telephone Encounter (Signed)
Received note from [employer], "I wanted to bring one of the Vivify patients to your attention. Karen Jimenez's BP averages seem to be steadily going up over the last few weeks. 2 week average of 162/91. Last med change 6/28, and next appt 8/28. "  Will ask nursing team to reach out: How is she tolerating Clonidine patch? If tolerating, increase to 0.3mg  patch.  If not tolerating, please inquire about symptoms and will provide alternate recommendation.   Alver Sorrow, NP

## 2023-02-15 NOTE — Addendum Note (Signed)
Addended by: Marlene Lard on: 02/15/2023 04:40 PM   Modules accepted: Orders

## 2023-03-02 ENCOUNTER — Other Ambulatory Visit: Payer: Self-pay | Admitting: Vascular Surgery

## 2023-03-08 ENCOUNTER — Other Ambulatory Visit: Payer: Self-pay | Admitting: Vascular Surgery

## 2023-03-09 ENCOUNTER — Ambulatory Visit (HOSPITAL_BASED_OUTPATIENT_CLINIC_OR_DEPARTMENT_OTHER): Payer: Medicare Other | Admitting: Cardiovascular Disease

## 2023-03-09 ENCOUNTER — Encounter (HOSPITAL_BASED_OUTPATIENT_CLINIC_OR_DEPARTMENT_OTHER): Payer: Self-pay | Admitting: Cardiovascular Disease

## 2023-03-09 ENCOUNTER — Encounter: Payer: Self-pay | Admitting: Vascular Surgery

## 2023-03-09 ENCOUNTER — Ambulatory Visit (HOSPITAL_COMMUNITY)
Admission: RE | Admit: 2023-03-09 | Discharge: 2023-03-09 | Disposition: A | Discharge status: Home / Self Care | Payer: Medicare Other | Source: Ambulatory Visit | Attending: Surgery | Admitting: Surgery

## 2023-03-09 ENCOUNTER — Ambulatory Visit (INDEPENDENT_AMBULATORY_CARE_PROVIDER_SITE_OTHER): Payer: Medicare Other | Admitting: Vascular Surgery

## 2023-03-09 VITALS — BP 199/94 | HR 70 | Temp 97.2°F | Resp 18 | Ht 64.0 in | Wt 175.2 lb

## 2023-03-09 VITALS — BP 216/86 | HR 88 | Ht 64.0 in | Wt 176.8 lb

## 2023-03-09 DIAGNOSIS — I2510 Atherosclerotic heart disease of native coronary artery without angina pectoris: Secondary | ICD-10-CM

## 2023-03-09 DIAGNOSIS — I214 Non-ST elevation (NSTEMI) myocardial infarction: Secondary | ICD-10-CM | POA: Diagnosis not present

## 2023-03-09 DIAGNOSIS — I471 Supraventricular tachycardia, unspecified: Secondary | ICD-10-CM

## 2023-03-09 DIAGNOSIS — I701 Atherosclerosis of renal artery: Secondary | ICD-10-CM | POA: Diagnosis present

## 2023-03-09 DIAGNOSIS — Z006 Encounter for examination for normal comparison and control in clinical research program: Secondary | ICD-10-CM

## 2023-03-09 DIAGNOSIS — E7800 Pure hypercholesterolemia, unspecified: Secondary | ICD-10-CM

## 2023-03-09 DIAGNOSIS — I10 Essential (primary) hypertension: Secondary | ICD-10-CM

## 2023-03-09 MED ORDER — CLOPIDOGREL BISULFATE 75 MG PO TABS: 75.0000 mg | ORAL_TABLET | Freq: Every day | ORAL | 6 refills | Status: DC

## 2023-03-09 MED ORDER — ROSUVASTATIN CALCIUM 10 MG PO TABS: 10.0000 mg | ORAL_TABLET | Freq: Every day | ORAL | 11 refills | Status: DC

## 2023-03-09 NOTE — Progress Notes (Signed)
Advanced Hypertension Clinic Follow-up:    Date:  03/14/2023   ID:  Karen Jimenez, DOB 01-25-1956, MRN [id number]  PCP:  Shon Hale, MD  Cardiologist:  None  Nephrologist:  Referring MD: Shon Hale, *   CC: Hypertension  History of Present Illness:    Karen Jimenez is a 68 y.o. female with a hx of hypertension, renal artery stenosis status post left renal artery stenting, CKD 3B, hyperlipidemia, prior tobacco abuse, here for follow-up.  She was first seen in advanced hypertension clinic 10/2022.    She underwent stenting of the left renal artery 02/2022.  Renal artery Dopplers 03/2022 revealed 1 to 59% stenosis bilaterally.  Unfortunately she has not been able to wean as many medications as she had hoped.  She was initially taken off amlodipine but is now back on clonidine, metoprolol, and amlodipine.  She continues to remain off of spironolactone.  She followed up with Dr. Randie Heinz and requested referral to the advanced hypertension clinic.  He noted that though her stent appeared patent on a recent noncontrast CT he recommended repeating a renal artery Dopplers to ensure that the stent was patent.  She was seen in the ED for hypertensive urgency 10/29/2022.  Home blood pressures were around 211 systolic.   Karen Jimenez noted that her blood pressure had been poorly managed in the past, and she believed her kidney issues may have resulted from the untreated hypertension. She had been off spironolactone since the end of February due to high potassium levels and had experienced swelling since resuming amlodipine. She was taking furosemide as a diuretic, but it didn't seem to be helping with the swelling with the amlodipine. At her initial visit, metoprolol was switched to carvedilol and clonidine was increased. Labs were negative for hyperaldosteronism. Catecholamines and metanephrines were normal.  A home sleep study was ordered but not performed.  She discontinued her  amlodipine due to swelling.  Blood pressures prior to discontinuing it were averaging in the 140s over 70s.  Since that time blood pressures have increased to the 150s to 60s on average.  Clonidine was increased to 0.3 mg twice daily.  Blood pressures were better controlled but she did not tolerate it due to feeling like a zombie.  She tried the patch but blood pressures were not well-controlled.  She saw her nephrologist and was started on doxazosin.  She saw Dr. Randie Heinz this morning and was found to have severe stenosis of her left renal artery. They are considering angiography. She reports that Dr. Randie Heinz has also recommended she start Plavix. Recently her blood pressures in [employer] have averaged 150s-160s systolic. Today, in the office her blood pressure is initially elevated to 211/75, and 216/86 on manual recheck. She confirms that her blood pressure had improved after her prior renal stent placement. She is taking 1 mg clonidine in the morning, 0.2 mg in the evening. She has been keeping amlodipine with her to only take as needed for hypertensive emergencies. She continues to struggle with significant LE edema that has limited her formal exercise. Currently on furosemide 40 mg daily. She owns compression socks at home. Also notes that she works at [employer]. She denies any palpitations, chest pain, shortness of breath, lightheadedness, headaches, syncope, orthopnea, or PND.  Previous antihypertensives: Hydralazine Losartan Spironolactone- hyperkalemia Amlodipine - swelling  Past Medical History:  Diagnosis Date   Anxiety 12/29/2017   Benign hypertension with chronic kidney disease, stage III (HCC) 02/25/2020   History of  smoking    Hx of hyperlipidemia    Hyperlipidemia    Hypertension    Hyponatremia 02/13/2020   Left renal artery stenosis (HCC) 02/13/2020   Low back pain 04/18/2020   Prediabetes 01/21/2017   SVT (supraventricular tachycardia) 01/21/2017   Vitamin D deficiency disease  01/21/2017    Past Surgical History:  Procedure Laterality Date   APPENDECTOMY     KNEE ARTHROSCOPY Right 2011, 2012   MEDIAL PARTIAL KNEE REPLACEMENT  11/2010   PERIPHERAL VASCULAR INTERVENTION  02/15/2022   Procedure: PERIPHERAL VASCULAR INTERVENTION;  Surgeon: Maeola Harman, MD;  Location: [facility] INVASIVE CV LAB;  Service: Cardiovascular;;  LT Renal   RENAL ANGIOGRAPHY N/A 02/15/2022   Procedure: RENAL ANGIOGRAPHY;  Surgeon: Maeola Harman, MD;  Location: [facility] INVASIVE CV LAB;  Service: Cardiovascular;  Laterality: N/A;   TONSILLECTOMY AND ADENOIDECTOMY      Current Medications: Current Meds  Medication Sig   aspirin EC 81 MG tablet Take 81 mg by mouth daily. Swallow whole.   carvedilol (COREG) 25 MG tablet Take 1 tablet (25 mg total) by mouth 2 (two) times daily.   cloNIDine (CATAPRES) 0.1 MG tablet Take 0.1 mg by mouth in the morning.   cloNIDine (CATAPRES) 0.2 MG tablet Take 0.2 mg by mouth every evening.   clopidogrel (PLAVIX) 75 MG tablet Take 1 tablet (75 mg total) by mouth daily.   doxazosin (CARDURA) 8 MG tablet Take 8 mg by mouth 2 (two) times daily.   furosemide (LASIX) 40 MG tablet Take 40 mg by mouth daily.   rosuvastatin (CRESTOR) 10 MG tablet Take 1 tablet (10 mg total) by mouth daily.   [DISCONTINUED] rosuvastatin (CRESTOR) 10 MG tablet TAKE 1 TABLET(10 MG) BY MOUTH DAILY     Allergies:   Amoxicillin, Losartan, Pseudoephedrine hcl, Hydralazine, Amlodipine, Hydrocodone, Hydrocodone-acetaminophen, and Nitrofurantoin   Social History   Socioeconomic History   Marital status: Married    Spouse name: Not on file   Number of children: 2   Years of education: Not on file   Highest education level: Not on file  Occupational History    Employer: [employer]  Tobacco Use   Smoking status: Former    Current packs/day: 0.50    Average packs/day: 0.5 packs/day for 30.0 years (15.0 ttl pk-yrs)    Types: Cigarettes   Smokeless tobacco: Never  Vaping Use    Vaping status: Never Used  Substance and Sexual Activity   Alcohol use: No   Drug use: No   Sexual activity: Never    Partners: Male    Birth control/protection: Post-menopausal  Other Topics Concern   Not on file  Social History Narrative   Not on file   Social Determinants of Health   Financial Resource Strain: Not on file  Food Insecurity: No Food Insecurity (06/27/2022)   Hunger Vital Sign    Worried About Running Out of Food in the Last Year: Never true    Ran Out of Food in the Last Year: Never true  Transportation Needs: No Transportation Needs (06/27/2022)   PRAPARE - Administrator, Civil Service (Medical): No    Lack of Transportation (Non-Medical): No  Physical Activity: Inactive (11/09/2022)   Exercise Vital Sign    Days of Exercise per Week: 0 days    Minutes of Exercise per Session: 0 min  Stress: Not on file  Social Connections: Not on file     Family History: The patient's family history includes Colon cancer (age  of onset: 82) in her mother; Lung cancer (age of onset: 61) in her father.  ROS:   Please see the history of present illness.    (+) BLE edema All other systems reviewed and are negative.  EKGs/Labs/Other Studies Reviewed:    Renal Artery Dopplers  03/09/2023  (Preliminary Results): Summary:  Renal:    Left: Evidence of a > 60% stenosis in the left renal artery, unable        to visualize stent. Normal size of left kidney.          NOTE: This was a technically difficult and limited exam,        further imaging modality may be warranted.   Echo  12/13/2022:  1. Left ventricular ejection fraction, by estimation, is 55 to 60%. The  left ventricle has normal function. The left ventricle has no regional  wall motion abnormalities. There is mild concentric left ventricular  hypertrophy. Left ventricular diastolic  parameters are consistent with Grade I diastolic dysfunction (impaired  relaxation).   2. Right ventricular systolic  function is normal. The right ventricular  size is normal.   3. The mitral valve is normal in structure. No evidence of mitral valve  regurgitation. No evidence of mitral stenosis.   4. The aortic valve was not well visualized. Aortic valve regurgitation  is not visualized. No aortic stenosis is present.   5. The inferior vena cava is normal in size with greater than 50%  respiratory variability, suggesting right atrial pressure of 3 mmHg.   Comparison(s): No prior Echocardiogram.   Renal Artery Dopplers  11/01/2022: Summary:  Renal:    Right: Abnormal size for the right kidney. The renal artery appears         patent, limited visability.  Left:  Normal size of left kidney. TThe renal artery stent is not         well visualized. Increased velocity noted in what appears to         be the stent in the 60 - 99% stenosis range.           NOTE: Suboptimal exam, further imaging may be warranted.   EKG:  EKG is personally reviewed. 03/09/2023: Not ordered.  Recent Labs: 06/27/2022: ALT 25 06/28/2022: BUN 36; Creatinine, Ser 2.79; Hemoglobin 11.1; Platelets 274; Potassium 4.6; Sodium 133   Recent Lipid Panel No results found for: "CHOL", "TRIG", "HDL", "CHOLHDL", "VLDL", "LDLCALC", "LDLDIRECT"  Physical Exam:    VS:  BP (!) 216/86 (BP Location: Right Arm, Patient Position: Sitting, Cuff Size: Large)   Pulse 88   Ht 5\' 4"  (1.626 m)   Wt 176 lb 12.8 oz (80.2 kg)   LMP 04/11/2009 (Within Days)   SpO2 97%   BMI 30.35 kg/m  , BMI Body mass index is 30.35 kg/m. GENERAL:  Well appearing HEENT: Pupils equal round and reactive, fundi not visualized, oral mucosa unremarkable NECK:  No jugular venous distention, waveform within normal limits, carotid upstroke brisk and symmetric, no bruits, no thyromegaly LUNGS:  Clear to auscultation bilaterally HEART:  RRR.  PMI not displaced or sustained, S1 and S2 within normal limits, no S3, no S4, no clicks, no rubs, no murmurs ABD:  Flat, positive  bowel sounds normal in frequency in pitch, no bruits, no rebound, no guarding, no midline pulsatile mass, no hepatomegaly, no splenomegaly EXT:  2 plus pulses throughout, + LE edema L>R, no cyanosis, no clubbing SKIN:  No rashes, no nodules NEURO:  Cranial nerves II through  XII grossly intact, motor grossly intact throughout PSYCH:  Cognitively intact, oriented to person place and time   ASSESSMENT/PLAN:    # L Renal Artery Stenosis Blockage in the renal artery stent causing hypertension. Plan for possible stent replacement or balloon angioplasty pending insurance approval.  Continue follow up with Vascular Surgery. -Resume Plavix to prevent stent occlusion. -Continue monitoring blood pressure and adjust medications as needed.  # Hypertension Elevated blood pressure likely secondary to renal artery stenosis. Amlodipine effective but causes significant peripheral edema. -Continue current antihypertensive regimen including Doxazosin 8mg  twice daily and Clonidine 1mg  in the morning and 2mg  at night. -Consider taking Amlodipine as needed for hypertensive emergencies. -Check blood pressure at home and report readings to the office.  # Peripheral Edema Likely secondary to Amlodipine use and impaired kidney function. -Continue Furosemide for fluid management. -Consider water exercises to improve circulation and reduce swelling. -Use compression stockings and elevate feet when possible.  # Hyperlipidemia Controlled with Rosuvastatin. -Continue Rosuvastatin. -Encourage low carbohydrate diet and increased exercise to manage triglycerides.  Follow-up in 6 months or sooner if there are changes in condition.     Screening for Secondary Hypertension:     11/09/2022   11:07 AM  Causes  Drugs/Herbals Screened     - Comments high sodium-eats out, 2 coffees daily, no EtOH.  No NSAIDS  Renovascular HTN Screened     - Comments s/p renal artery stenting  Sleep Apnea Screened     - Comments  snores.  some daytime somnolence    Relevant Labs/Studies:    Latest Ref Rng & Units 06/28/2022    9:20 AM 06/27/2022    5:15 AM 02/15/2022    9:21 AM  Basic Labs  Sodium 135 - 145 mmol/L 133  129  133   Potassium 3.5 - 5.1 mmol/L 4.6  5.6  5.7   Creatinine 0.44 - 1.00 mg/dL 1.61  0.96  0.45           Latest Ref Rng & Units 11/11/2022   10:17 AM  Renin/Aldosterone   Aldosterone 0.0 - 30.0 ng/dL 40.9   Aldos/Renin Ratio 0.0 - 30.0 1.4        Latest Ref Rng & Units 11/11/2022   10:17 AM  Metanephrines/Catecholamines   Epinephrine 0 - 62 pg/mL 46   Norepinephrine 0 - 874 pg/mL 1,298   Dopamine 0 - 48 pg/mL <30   Metanephrines 0.0 - 88.0 pg/mL 40.6   Normetanephrines  0.0 - 285.2 pg/mL 220.9           03/09/2023    8:25 AM  Renovascular   Renal Artery [place] Completed Yes     Disposition:    FU with Lela Gell C. Duke Salvia, MD, [facility] in 6 months.  Medication Adjustments/Labs and Tests Ordered: Current medicines are reviewed at length with the patient today.  Concerns regarding medicines are outlined above.   No orders of the defined types were placed in this encounter.  No orders of the defined types were placed in this encounter.  I,Mathew Stumpf,acting as a Neurosurgeon for Chilton Si, MD.,have documented all relevant documentation on the behalf of Chilton Si, MD,as directed by  Chilton Si, MD while in the presence of Chilton Si, MD.  I, Halcyon Heck C. Duke Salvia, MD have reviewed all documentation for this visit.  The documentation of the exam, diagnosis, procedures, and orders on 03/14/2023 are all accurate and complete.   Signed, Chilton Si, MD  03/14/2023 2:27 PM    [facility]

## 2023-03-09 NOTE — Progress Notes (Signed)
Patient ID: Karen Jimenez, female   DOB: [birth year], 67 y.o.   MRN: [id number]  Reason for Consult: No chief complaint on file.   Referred by Shon Hale, *  Subjective:     HPI:  Karen Jimenez is a 67 y.o. female with history of symptomatic left renal artery stenosis worsening kidney function and increased requirement of antihypertensive medications.  She has undergone stenting 1 year ago and initially did have significant resolution of hypertension but is now on a study through cardiology.  She states that recently her Cardura was increased.  Overall she has had difficulty controlling her hypertension.  She thinks that her kidney function has remained stable and she is followed by Dr. Signe Colt.  She continues to work full-time does not have any new complaints related to today's visit.  Past Medical History:  Diagnosis Date   Anxiety 12/29/2017   Benign hypertension with chronic kidney disease, stage III (HCC) 02/25/2020   History of smoking    Hx of hyperlipidemia    Hyperlipidemia    Hypertension    Hyponatremia 02/13/2020   Left renal artery stenosis (HCC) 02/13/2020   Low back pain 04/18/2020   Prediabetes 01/21/2017   SVT (supraventricular tachycardia) 01/21/2017   Vitamin D deficiency disease 01/21/2017   Family History  Problem Relation Age of Onset   Colon cancer Mother 67   Lung cancer Father 22   Past Surgical History:  Procedure Laterality Date   APPENDECTOMY     KNEE ARTHROSCOPY Right 2011, 2012   MEDIAL PARTIAL KNEE REPLACEMENT  11/2010   PERIPHERAL VASCULAR INTERVENTION  02/15/2022   Procedure: PERIPHERAL VASCULAR INTERVENTION;  Surgeon: Maeola Harman, MD;  Location: [facility] INVASIVE CV LAB;  Service: Cardiovascular;;  LT Renal   RENAL ANGIOGRAPHY N/A 02/15/2022   Procedure: RENAL ANGIOGRAPHY;  Surgeon: Maeola Harman, MD;  Location: [facility] INVASIVE CV LAB;  Service: Cardiovascular;  Laterality: N/A;   TONSILLECTOMY AND ADENOIDECTOMY       Short Social History:  Social History   Tobacco Use   Smoking status: Former    Current packs/day: 0.50    Average packs/day: 0.5 packs/day for 30.0 years (15.0 ttl pk-yrs)    Types: Cigarettes   Smokeless tobacco: Never  Substance Use Topics   Alcohol use: No    Allergies  Allergen Reactions   Amoxicillin Hives   Losartan     Other reaction(s): Other (See Comments) Severe AKI   Pseudoephedrine Hcl Palpitations    Heart rate increases   Hydralazine Nausea Only and Rash   Amlodipine Swelling   Hydrocodone Nausea And Vomiting    Headache   Hydrocodone-Acetaminophen     Other reaction(s): nausea/vomiting   Nitrofurantoin Other (See Comments)    Body aches    Current Outpatient Medications  Medication Sig Dispense Refill   aspirin EC 81 MG tablet Take 81 mg by mouth daily. Swallow whole.     carvedilol (COREG) 25 MG tablet Take 1 tablet (25 mg total) by mouth 2 (two) times daily. 180 tablet 3   cloNIDine (CATAPRES) 0.2 MG tablet Take 0.2 mg by mouth 2 (two) times daily.     doxazosin (CARDURA) 2 MG tablet Take 4 mg by mouth 2 (two) times daily. Patient is taking two 2mg  tablets morning and evening     furosemide (LASIX) 40 MG tablet Take 40 mg by mouth daily.     rosuvastatin (CRESTOR) 10 MG tablet TAKE 1 TABLET(10 MG) BY MOUTH DAILY 30 tablet 0  No current facility-administered medications for this visit.    Review of Systems  Constitutional:  Constitutional negative. HENT: HENT negative.  Eyes: Eyes negative.  Respiratory: Respiratory negative.  Cardiovascular: Cardiovascular negative.  GI: Gastrointestinal negative.  Musculoskeletal: Musculoskeletal negative.  Skin: Skin negative.  Neurological: Neurological negative. Hematologic: Hematologic/lymphatic negative.  Psychiatric: Psychiatric negative.        Objective:  Objective  Vitals:   03/09/23 0831  BP: (!) 199/94  Pulse: 70  Resp: 18  Temp: (!) 97.2 F (36.2 C)  SpO2: 95%    Physical  Exam HENT:     Head: Normocephalic.     Nose: Nose normal.  Eyes:     Pupils: Pupils are equal, round, and reactive to light.  Cardiovascular:     Rate and Rhythm: Normal rate.  Abdominal:     General: Abdomen is flat.     Palpations: Abdomen is soft. There is no mass.     Comments: No bruit  Musculoskeletal:        General: Normal range of motion.     Cervical back: Normal range of motion.     Right lower leg: No edema.     Left lower leg: No edema.  Skin:    General: Skin is warm.     Capillary Refill: Capillary refill takes less than 2 seconds.  Neurological:     General: No focal deficit present.     Mental Status: She is alert.  Psychiatric:        Mood and Affect: Mood normal.        Thought Content: Thought content normal.        Judgment: Judgment normal.     Data: Duplex Findings:   +-----------------+--------+--------+--------+  Left Renal ArteryPSV cm/sEDV cm/sComment   +-----------------+--------+--------+--------+  Proximal           77      15             +-----------------+--------+--------+--------+  Mid               748      72   tortuous  +-----------------+--------+--------+--------+  Distal             66      17             +-----------------+--------+--------+--------+   +------------+--------+--------+--+-----------+--------+--------+---+  Right KidneyPSV cm/sEDV cm/sRILeft KidneyPSV cm/sEDV cm/sRI   +------------+--------+--------+--+-----------+--------+--------+---+  Upper Pole                    Upper Pole                      +------------+--------+--------+--+-----------+--------+--------+---+  Mid                          Mid                             +------------+--------+--------+--+-----------+--------+--------+---+  Lower Pole                    Lower Pole                      +------------+--------+--------+--+-----------+--------+--------+---+  Hilar                         Hilar                           +------------+--------+--------+--+-----------+--------+--------+---+   +------------------++------------------+-------+  Right Kidney      Left Kidney                +------------------++------------------+-------+  RAR              RAR                        +------------------++------------------+-------+  RAR (manual)      RAR (manual)               +------------------++------------------+-------+  Cortex           Cortex                     +------------------++------------------+-------+  Cortex thickness  Corex thickness   1.04 mm  +------------------++------------------+-------+  Kidney length (cm)Kidney length (cm)9.00     +------------------++------------------+-------+     Summary:  Renal:    Left: Evidence of a > 60% stenosis in the left renal artery, unable        to visualize stent. Normal size of left kidney.          NOTE: This was a technically difficult and limited exam,        further imaging modality may be warranted.      Assessment/Plan:    67 year old female with history of left renal artery stenosis now with increased velocities in the left renal artery likely in the stent or proximal or distal to the stent.  With increasing requirement of antihypertensive medications I discussed with her she may benefit from repeat angiography although this will need to be performed with CO2.  I will send Plavix and Crestor to her pharmacy and we will get her scheduled on a Monday in the near future for angiogram with evaluation of her renal arteries.  We discussed keeping her on aspirin and Plavix long-term if we have to reintervene on the stent.     Maeola Harman MD [facility]

## 2023-03-09 NOTE — Research (Signed)
I saw pt today after Dr. Leonides Sake follow up visit. Pt is in Dr. Leonides Sake Virtual Care HTN Study. Pt filled out research survey. Pt was enrolled in Group 2. Pt has successfully completed the Virtual Care HTN Study. Pt returned her Vivify b/p monitor in office today.

## 2023-03-09 NOTE — Patient Instructions (Signed)

## 2023-03-09 NOTE — H&P (View-Only) (Signed)
Patient ID: Karen Jimenez, female   DOB: [birth year], 67 y.o.   MRN: [id number]  Reason for Consult: No chief complaint on file.   Referred by Shon Hale, *  Subjective:     HPI:  Karen Jimenez is a 67 y.o. female with history of symptomatic left renal artery stenosis worsening kidney function and increased requirement of antihypertensive medications.  She has undergone stenting 1 year ago and initially did have significant resolution of hypertension but is now on a study through cardiology.  She states that recently her Cardura was increased.  Overall she has had difficulty controlling her hypertension.  She thinks that her kidney function has remained stable and she is followed by Dr. Signe Colt.  She continues to work full-time does not have any new complaints related to today's visit.  Past Medical History:  Diagnosis Date   Anxiety 12/29/2017   Benign hypertension with chronic kidney disease, stage III (HCC) 02/25/2020   History of smoking    Hx of hyperlipidemia    Hyperlipidemia    Hypertension    Hyponatremia 02/13/2020   Left renal artery stenosis (HCC) 02/13/2020   Low back pain 04/18/2020   Prediabetes 01/21/2017   SVT (supraventricular tachycardia) 01/21/2017   Vitamin D deficiency disease 01/21/2017   Family History  Problem Relation Age of Onset   Colon cancer Mother 67   Lung cancer Father 22   Past Surgical History:  Procedure Laterality Date   APPENDECTOMY     KNEE ARTHROSCOPY Right 2011, 2012   MEDIAL PARTIAL KNEE REPLACEMENT  11/2010   PERIPHERAL VASCULAR INTERVENTION  02/15/2022   Procedure: PERIPHERAL VASCULAR INTERVENTION;  Surgeon: Maeola Harman, MD;  Location: [facility] INVASIVE CV LAB;  Service: Cardiovascular;;  LT Renal   RENAL ANGIOGRAPHY N/A 02/15/2022   Procedure: RENAL ANGIOGRAPHY;  Surgeon: Maeola Harman, MD;  Location: [facility] INVASIVE CV LAB;  Service: Cardiovascular;  Laterality: N/A;   TONSILLECTOMY AND ADENOIDECTOMY       Short Social History:  Social History   Tobacco Use   Smoking status: Former    Current packs/day: 0.50    Average packs/day: 0.5 packs/day for 30.0 years (15.0 ttl pk-yrs)    Types: Cigarettes   Smokeless tobacco: Never  Substance Use Topics   Alcohol use: No    Allergies  Allergen Reactions   Amoxicillin Hives   Losartan     Other reaction(s): Other (See Comments) Severe AKI   Pseudoephedrine Hcl Palpitations    Heart rate increases   Hydralazine Nausea Only and Rash   Amlodipine Swelling   Hydrocodone Nausea And Vomiting    Headache   Hydrocodone-Acetaminophen     Other reaction(s): nausea/vomiting   Nitrofurantoin Other (See Comments)    Body aches    Current Outpatient Medications  Medication Sig Dispense Refill   aspirin EC 81 MG tablet Take 81 mg by mouth daily. Swallow whole.     carvedilol (COREG) 25 MG tablet Take 1 tablet (25 mg total) by mouth 2 (two) times daily. 180 tablet 3   cloNIDine (CATAPRES) 0.2 MG tablet Take 0.2 mg by mouth 2 (two) times daily.     doxazosin (CARDURA) 2 MG tablet Take 4 mg by mouth 2 (two) times daily. Patient is taking two 2mg  tablets morning and evening     furosemide (LASIX) 40 MG tablet Take 40 mg by mouth daily.     rosuvastatin (CRESTOR) 10 MG tablet TAKE 1 TABLET(10 MG) BY MOUTH DAILY 30 tablet 0  No current facility-administered medications for this visit.    Review of Systems  Constitutional:  Constitutional negative. HENT: HENT negative.  Eyes: Eyes negative.  Respiratory: Respiratory negative.  Cardiovascular: Cardiovascular negative.  GI: Gastrointestinal negative.  Musculoskeletal: Musculoskeletal negative.  Skin: Skin negative.  Neurological: Neurological negative. Hematologic: Hematologic/lymphatic negative.  Psychiatric: Psychiatric negative.        Objective:  Objective  Vitals:   03/09/23 0831  BP: (!) 199/94  Pulse: 70  Resp: 18  Temp: (!) 97.2 F (36.2 C)  SpO2: 95%    Physical  Exam HENT:     Head: Normocephalic.     Nose: Nose normal.  Eyes:     Pupils: Pupils are equal, round, and reactive to light.  Cardiovascular:     Rate and Rhythm: Normal rate.  Abdominal:     General: Abdomen is flat.     Palpations: Abdomen is soft. There is no mass.     Comments: No bruit  Musculoskeletal:        General: Normal range of motion.     Cervical back: Normal range of motion.     Right lower leg: No edema.     Left lower leg: No edema.  Skin:    General: Skin is warm.     Capillary Refill: Capillary refill takes less than 2 seconds.  Neurological:     General: No focal deficit present.     Mental Status: She is alert.  Psychiatric:        Mood and Affect: Mood normal.        Thought Content: Thought content normal.        Judgment: Judgment normal.     Data: Duplex Findings:   +-----------------+--------+--------+--------+  Left Renal ArteryPSV cm/sEDV cm/sComment   +-----------------+--------+--------+--------+  Proximal           77      15             +-----------------+--------+--------+--------+  Mid               748      72   tortuous  +-----------------+--------+--------+--------+  Distal             66      17             +-----------------+--------+--------+--------+   +------------+--------+--------+--+-----------+--------+--------+---+  Right KidneyPSV cm/sEDV cm/sRILeft KidneyPSV cm/sEDV cm/sRI   +------------+--------+--------+--+-----------+--------+--------+---+  Upper Pole                    Upper Pole                      +------------+--------+--------+--+-----------+--------+--------+---+  Mid                          Mid                             +------------+--------+--------+--+-----------+--------+--------+---+  Lower Pole                    Lower Pole                      +------------+--------+--------+--+-----------+--------+--------+---+  Hilar                         Hilar                           +------------+--------+--------+--+-----------+--------+--------+---+   +------------------++------------------+-------+  Right Kidney      Left Kidney                +------------------++------------------+-------+  RAR              RAR                        +------------------++------------------+-------+  RAR (manual)      RAR (manual)               +------------------++------------------+-------+  Cortex           Cortex                     +------------------++------------------+-------+  Cortex thickness  Corex thickness   1.04 mm  +------------------++------------------+-------+  Kidney length (cm)Kidney length (cm)9.00     +------------------++------------------+-------+     Summary:  Renal:    Left: Evidence of a > 60% stenosis in the left renal artery, unable        to visualize stent. Normal size of left kidney.          NOTE: This was a technically difficult and limited exam,        further imaging modality may be warranted.      Assessment/Plan:    67 year old female with history of left renal artery stenosis now with increased velocities in the left renal artery likely in the stent or proximal or distal to the stent.  With increasing requirement of antihypertensive medications I discussed with her she may benefit from repeat angiography although this will need to be performed with CO2.  I will send Plavix and Crestor to her pharmacy and we will get her scheduled on a Monday in the near future for angiogram with evaluation of her renal arteries.  We discussed keeping her on aspirin and Plavix long-term if we have to reintervene on the stent.     Maeola Harman MD [facility]

## 2023-03-09 NOTE — Progress Notes (Deleted)
Advanced Hypertension Clinic Initial Assessment:    Date:  03/09/2023   ID:  Karen Jimenez, DOB December 26, 1955, MRN [id number]  PCP:  Shon Hale, MD  Cardiologist:  None  Nephrologist: Dr. Signe Colt  Referring MD: Shon Hale, *   CC: Hypertension  History of Present Illness:    Karen Jimenez is a 67 y.o. female with a hx of hypertension, renal artery stenosis status post left renal artery stenting, CKD 3B, hyperlipidemia, prior tobacco abuse here for follow-up.  She was first seen in advanced hypertension clinic 10/2022.   She underwent stenting of the left renal artery 02/2022.  Renal artery Dopplers 03/2022 revealed 1 to 59% stenosis bilaterally.  Unfortunately she has not been able to wean as many medications as she had hoped.  She was initially taken off amlodipine but is now back on clonidine, metoprolol, and amlodipine.  She continues to remain off of spironolactone.  She followed up with Dr. Randie Heinz and requested referral to the advanced hypertension clinic.  He noted that though her stent appeared patent on a recent noncontrast CT he recommended repeating a renal artery Dopplers to ensure that the stent was patent.  She was seen in the ED for hypertensive urgency 10/29/2022.  Home blood pressures were around 211 systolic.  Ms. Noon notes that her blood pressure has been poorly managed in the past, and she believes her kidney issues may have resulted from the untreated hypertension. She has been under the care of a nephrologist for the last two years and has experienced medication changes due to side effects such as high potassium from spironolactone and swelling from amlodipine.  She has been off spironolactone since the end of February due to high potassium levels and has experienced swelling since resuming amlodipine. She is also taking furosemide as a diuretic, but it does not seem to be helping with the swelling now that she is back on amlodipine.  Encouraged  initial visit metoprolol switched to carvedilol and clonidine was increased.  Labs were negative for hyperaldosteronism.  Catecholamines and metanephrines were normal.  A home sleep study was ordered but not yet performed.  She discontinued her amlodipine due to swelling.  Blood pressures prior to discontinuing it were averaging in the 140s over 70s.  Since that time blood pressures have increased to the 150s to 60s on average.  Clonidine was increased to 0.3 mg twice daily.  Blood pressures were better controlled but she did not tolerate it due to feeling like a zombie.  She tried the patch but blood pressures were not well-controlled.  She saw her nephrologist and was started on doxazosin. Previous antihypertensives: Hydralazine Losartan Spironolactone- hyperkalemia   Past Medical History:  Diagnosis Date   Anxiety 12/29/2017   Benign hypertension with chronic kidney disease, stage III (HCC) 02/25/2020   History of smoking    Hx of hyperlipidemia    Hyperlipidemia    Hypertension    Hyponatremia 02/13/2020   Left renal artery stenosis (HCC) 02/13/2020   Low back pain 04/18/2020   Prediabetes 01/21/2017   SVT (supraventricular tachycardia) 01/21/2017   Vitamin D deficiency disease 01/21/2017    Past Surgical History:  Procedure Laterality Date   APPENDECTOMY     KNEE ARTHROSCOPY Right 2011, 2012   MEDIAL PARTIAL KNEE REPLACEMENT  11/2010   PERIPHERAL VASCULAR INTERVENTION  02/15/2022   Procedure: PERIPHERAL VASCULAR INTERVENTION;  Surgeon: Maeola Harman, MD;  Location: [facility] INVASIVE CV LAB;  Service: Cardiovascular;;  LT Renal  RENAL ANGIOGRAPHY N/A 02/15/2022   Procedure: RENAL ANGIOGRAPHY;  Surgeon: Maeola Harman, MD;  Location: [facility] INVASIVE CV LAB;  Service: Cardiovascular;  Laterality: N/A;   TONSILLECTOMY AND ADENOIDECTOMY      Current Medications: No outpatient medications have been marked as taking for the 03/09/23 encounter (Appointment) with Chilton Si, MD.     Allergies:   Amoxicillin, Losartan, Pseudoephedrine hcl, Hydralazine, Amlodipine, Hydrocodone, Hydrocodone-acetaminophen, and Nitrofurantoin   Social History   Socioeconomic History   Marital status: Married    Spouse name: Not on file   Number of children: 2   Years of education: Not on file   Highest education level: Not on file  Occupational History    Employer: [employer]  Tobacco Use   Smoking status: Former    Current packs/day: 0.50    Average packs/day: 0.5 packs/day for 30.0 years (15.0 ttl pk-yrs)    Types: Cigarettes   Smokeless tobacco: Never  Vaping Use   Vaping status: Never Used  Substance and Sexual Activity   Alcohol use: No   Drug use: No   Sexual activity: Never    Partners: Male    Birth control/protection: Post-menopausal  Other Topics Concern   Not on file  Social History Narrative   Not on file   Social Determinants of Health   Financial Resource Strain: Not on file  Food Insecurity: No Food Insecurity (06/27/2022)   Hunger Vital Sign    Worried About Running Out of Food in the Last Year: Never true    Ran Out of Food in the Last Year: Never true  Transportation Needs: No Transportation Needs (06/27/2022)   PRAPARE - Administrator, Civil Service (Medical): No    Lack of Transportation (Non-Medical): No  Physical Activity: Inactive (11/09/2022)   Exercise Vital Sign    Days of Exercise per Week: 0 days    Minutes of Exercise per Session: 0 min  Stress: Not on file  Social Connections: Not on file     Family History: The patient's family history includes Colon cancer (age of onset: 52) in her mother; Lung cancer (age of onset: 76) in her father.  ROS:   Please see the history of present illness.     All other systems reviewed and are negative.  EKGs/Labs/Other Studies Reviewed:    EKG:  EKG is not ordered today.    Recent Labs: 06/27/2022: ALT 25 06/28/2022: BUN 36; Creatinine, Ser 2.79; Hemoglobin  11.1; Platelets 274; Potassium 4.6; Sodium 133   Recent Lipid Panel No results found for: "CHOL", "TRIG", "HDL", "CHOLHDL", "VLDL", "LDLCALC", "LDLDIRECT"  Physical Exam:   VS:  LMP 04/11/2009 (Within Days)  , BMI There is no height or weight on file to calculate BMI. GENERAL:  Well appearing HEENT: Pupils equal round and reactive, fundi not visualized, oral mucosa unremarkable NECK:  No jugular venous distention, waveform within normal limits, carotid upstroke brisk and symmetric, no bruits, no thyromegaly LUNGS:  Clear to auscultation bilaterally HEART:  RRR.  PMI not displaced or sustained,S1 and S2 within normal limits, no S3, no S4, no clicks, no rubs, no murmurs ABD:  Flat, positive bowel sounds normal in frequency in pitch, no bruits, no rebound, no guarding, no midline pulsatile mass, no hepatomegaly, no splenomegaly EXT:  2 plus pulses throughout, no edema, no cyanosis no clubbing SKIN:  No rashes no nodules NEURO:  Cranial nerves II through XII grossly intact, motor grossly intact throughout PSYCH:  Cognitively intact, oriented to  person place and time   ASSESSMENT/PLAN:    No problem-specific Assessment & Plan notes found for this encounter.  # Uncontrolled HTN: - Discontinue Metoprolol - Initiate Carvedilol 25 mg twice a day - Increase Clonidine to 0.2 mg twice a day - Schedule follow-up appointment in one month to reassess blood pressure control - Check renin, aldosterone, catecholamines and metanephrines - She consents to enrollment in our remote patient monitoring study and using the [employer] RPM system  # Renal artery stenosis: - Continue follow-up with nephrologist (Dr. Signe Colt) and Dr. Randie Heinz. - Renal Doppler 10/2022 concerning for in stent stenosis.  She continues to follow with Dr. Randie Heinz and will have  a repeat Doppler.  # LE Edema: - Continue Amlodipine for now.  Will attempt to wean once BP control improves.  - Continue furosemide - Encourage use of compression  stockings during daytime - Advise elevating legs when possible - She also notes shortness of breath.  Will check echo.  # Fatigue and possible sleep apnea: - Order [facility] home sleep study to rule out sleep apnea - Monitor for changes in fatigue with adjustment of blood pressure medications  Screening for Secondary Hypertension:     11/09/2022   11:07 AM  Causes  Drugs/Herbals Screened     - Comments high sodium-eats out, 2 coffees daily, no EtOH.  No NSAIDS  Renovascular HTN Screened     - Comments s/p renal artery stenting  Sleep Apnea Screened     - Comments snores.  some daytime somnolence    Relevant Labs/Studies:    Latest Ref Rng & Units 06/28/2022    9:20 AM 06/27/2022    5:15 AM 02/15/2022    9:21 AM  Basic Labs  Sodium 135 - 145 mmol/L 133  129  133   Potassium 3.5 - 5.1 mmol/L 4.6  5.6  5.7   Creatinine 0.44 - 1.00 mg/dL 4.09  8.11  9.14           Latest Ref Rng & Units 11/11/2022   10:17 AM  Renin/Aldosterone   Aldosterone 0.0 - 30.0 ng/dL 78.2   Aldos/Renin Ratio 0.0 - 30.0 1.4        Latest Ref Rng & Units 11/11/2022   10:17 AM  Metanephrines/Catecholamines   Epinephrine 0 - 62 pg/mL 46   Norepinephrine 0 - 874 pg/mL 1,298   Dopamine 0 - 48 pg/mL <30   Metanephrines 0.0 - 88.0 pg/mL 40.6   Normetanephrines  0.0 - 285.2 pg/mL 220.9           03/09/2023    7:18 AM  Renovascular   Renal Artery [place] Completed Yes    Disposition:    FU with MD/PharmD in 1 month    Medication Adjustments/Labs and Tests Ordered: Current medicines are reviewed at length with the patient today.  Concerns regarding medicines are outlined above.  No orders of the defined types were placed in this encounter.  No orders of the defined types were placed in this encounter.    Signed, Chilton Si, MD  03/09/2023 8:03 AM    [facility]

## 2023-03-14 ENCOUNTER — Encounter (HOSPITAL_BASED_OUTPATIENT_CLINIC_OR_DEPARTMENT_OTHER): Payer: Self-pay | Admitting: Cardiovascular Disease

## 2023-03-16 ENCOUNTER — Telehealth: Payer: Self-pay

## 2023-03-16 NOTE — Telephone Encounter (Signed)
Attempted to reach patient to schedule renal angiography. Left VM for patient to return call.

## 2023-03-17 ENCOUNTER — Other Ambulatory Visit: Payer: Self-pay

## 2023-03-17 DIAGNOSIS — I701 Atherosclerosis of renal artery: Secondary | ICD-10-CM

## 2023-03-17 NOTE — Telephone Encounter (Signed)
Patient returned call. Scheduled procedure for 9/23, per patient preference. Instructions provided and she voiced understanding.

## 2023-03-21 ENCOUNTER — Emergency Department (HOSPITAL_BASED_OUTPATIENT_CLINIC_OR_DEPARTMENT_OTHER): Payer: Medicare Other

## 2023-03-21 ENCOUNTER — Encounter (HOSPITAL_BASED_OUTPATIENT_CLINIC_OR_DEPARTMENT_OTHER): Payer: Self-pay

## 2023-03-21 ENCOUNTER — Other Ambulatory Visit: Payer: Self-pay

## 2023-03-21 ENCOUNTER — Observation Stay (HOSPITAL_BASED_OUTPATIENT_CLINIC_OR_DEPARTMENT_OTHER)
Admission: EM | Admit: 2023-03-21 | Discharge: 2023-03-22 | Disposition: A | Discharge status: Home / Self Care | Payer: Medicare Other | Attending: Internal Medicine | Admitting: Internal Medicine

## 2023-03-21 DIAGNOSIS — I110 Hypertensive heart disease with heart failure: Secondary | ICD-10-CM | POA: Diagnosis present

## 2023-03-21 DIAGNOSIS — I161 Hypertensive emergency: Secondary | ICD-10-CM

## 2023-03-21 DIAGNOSIS — Z7902 Long term (current) use of antithrombotics/antiplatelets: Secondary | ICD-10-CM | POA: Diagnosis not present

## 2023-03-21 DIAGNOSIS — D631 Anemia in chronic kidney disease: Secondary | ICD-10-CM | POA: Insufficient documentation

## 2023-03-21 DIAGNOSIS — J810 Acute pulmonary edema: Secondary | ICD-10-CM | POA: Diagnosis not present

## 2023-03-21 DIAGNOSIS — Z79899 Other long term (current) drug therapy: Secondary | ICD-10-CM | POA: Diagnosis not present

## 2023-03-21 DIAGNOSIS — I701 Atherosclerosis of renal artery: Secondary | ICD-10-CM | POA: Diagnosis present

## 2023-03-21 DIAGNOSIS — Z87891 Personal history of nicotine dependence: Secondary | ICD-10-CM | POA: Diagnosis not present

## 2023-03-21 DIAGNOSIS — E7800 Pure hypercholesterolemia, unspecified: Secondary | ICD-10-CM | POA: Diagnosis present

## 2023-03-21 DIAGNOSIS — I5031 Acute diastolic (congestive) heart failure: Secondary | ICD-10-CM

## 2023-03-21 DIAGNOSIS — Z1152 Encounter for screening for COVID-19: Secondary | ICD-10-CM | POA: Diagnosis not present

## 2023-03-21 DIAGNOSIS — I160 Hypertensive urgency: Secondary | ICD-10-CM | POA: Diagnosis not present

## 2023-03-21 DIAGNOSIS — Z7982 Long term (current) use of aspirin: Secondary | ICD-10-CM | POA: Insufficient documentation

## 2023-03-21 DIAGNOSIS — E871 Hypo-osmolality and hyponatremia: Secondary | ICD-10-CM | POA: Diagnosis not present

## 2023-03-21 DIAGNOSIS — Z955 Presence of coronary angioplasty implant and graft: Secondary | ICD-10-CM | POA: Diagnosis not present

## 2023-03-21 DIAGNOSIS — N1832 Chronic kidney disease, stage 3b: Secondary | ICD-10-CM | POA: Diagnosis not present

## 2023-03-21 DIAGNOSIS — R0602 Shortness of breath: Secondary | ICD-10-CM | POA: Diagnosis present

## 2023-03-21 DIAGNOSIS — I5030 Unspecified diastolic (congestive) heart failure: Secondary | ICD-10-CM | POA: Diagnosis present

## 2023-03-21 DIAGNOSIS — I10 Essential (primary) hypertension: Secondary | ICD-10-CM | POA: Diagnosis present

## 2023-03-21 DIAGNOSIS — I5033 Acute on chronic diastolic (congestive) heart failure: Principal | ICD-10-CM | POA: Diagnosis present

## 2023-03-21 LAB — BASIC METABOLIC PANEL
Anion gap: 11 (ref 5–15)
BUN: 47 mg/dL — ABNORMAL HIGH (ref 8–23)
CO2: 22 mmol/L (ref 22–32)
Calcium: 8.9 mg/dL (ref 8.9–10.3)
Chloride: 100 mmol/L (ref 98–111)
Creatinine, Ser: 2.49 mg/dL — ABNORMAL HIGH (ref 0.44–1.00)
GFR, Estimated: 21 mL/min — ABNORMAL LOW (ref >60)
Glucose, Bld: 121 mg/dL — ABNORMAL HIGH (ref 70–99)
Potassium: 3.5 mmol/L (ref 3.5–5.1)
Sodium: 133 mmol/L — ABNORMAL LOW (ref 135–145)

## 2023-03-21 LAB — CBC WITH DIFFERENTIAL/PLATELET
Abs Immature Granulocytes: 0.05 10*3/uL (ref 0.00–0.07)
Basophils Absolute: 0.1 10*3/uL (ref 0.0–0.1)
Basophils Relative: 1 %
Eosinophils Absolute: 0.2 10*3/uL (ref 0.0–0.5)
Eosinophils Relative: 2 %
HCT: 31.3 % — ABNORMAL LOW (ref 36.0–46.0)
Hemoglobin: 10.5 g/dL — ABNORMAL LOW (ref 12.0–15.0)
Immature Granulocytes: 1 %
Lymphocytes Relative: 14 %
Lymphs Abs: 1.3 10*3/uL (ref 0.7–4.0)
MCH: 27.3 pg (ref 26.0–34.0)
MCHC: 33.5 g/dL (ref 30.0–36.0)
MCV: 81.3 fL (ref 80.0–100.0)
Monocytes Absolute: 0.6 10*3/uL (ref 0.1–1.0)
Monocytes Relative: 6 %
Neutro Abs: 7.3 10*3/uL (ref 1.7–7.7)
Neutrophils Relative %: 76 %
Platelets: 288 10*3/uL (ref 150–400)
RBC: 3.85 MIL/uL — ABNORMAL LOW (ref 3.87–5.11)
RDW: 15.4 % (ref 11.5–15.5)
WBC: 9.5 10*3/uL (ref 4.0–10.5)
nRBC: 0 % (ref 0.0–0.2)

## 2023-03-21 LAB — TROPONIN I (HIGH SENSITIVITY)
Troponin I (High Sensitivity): 13 ng/L (ref <18)
Troponin I (High Sensitivity): 12 ng/L (ref <18)

## 2023-03-21 LAB — SARS CORONAVIRUS 2 BY RT PCR: SARS Coronavirus 2 by RT PCR: NEGATIVE

## 2023-03-21 LAB — BRAIN NATRIURETIC PEPTIDE: B Natriuretic Peptide: 815.8 pg/mL — ABNORMAL HIGH (ref 0.0–100.0)

## 2023-03-21 MED ORDER — ENOXAPARIN SODIUM 30 MG/0.3ML IJ SOSY
30.0000 mg | PREFILLED_SYRINGE | INTRAMUSCULAR | Status: DC
  Administered 2023-03-21: 30 mg via SUBCUTANEOUS
  Filled 2023-03-21: qty 0.3

## 2023-03-21 MED ORDER — FUROSEMIDE 10 MG/ML IJ SOLN
40.0000 mg | Freq: Once | INTRAMUSCULAR | Status: AC
  Administered 2023-03-21: 40 mg via INTRAVENOUS
  Filled 2023-03-21: qty 4

## 2023-03-21 MED ORDER — CARVEDILOL 25 MG PO TABS
25.0000 mg | ORAL_TABLET | Freq: Two times a day (BID) | ORAL | Status: DC
  Administered 2023-03-21 (×2): 25 mg via ORAL
  Filled 2023-03-21 (×2): qty 1

## 2023-03-21 MED ORDER — CLONIDINE HCL 0.1 MG PO TABS
0.1000 mg | ORAL_TABLET | Freq: Once | ORAL | Status: DC
  Filled 2023-03-21: qty 1

## 2023-03-21 MED ORDER — CLOPIDOGREL BISULFATE 75 MG PO TABS
75.0000 mg | ORAL_TABLET | Freq: Every day | ORAL | Status: DC
  Administered 2023-03-21 – 2023-03-22 (×2): 75 mg via ORAL
  Filled 2023-03-21 (×2): qty 1

## 2023-03-21 MED ORDER — ROSUVASTATIN CALCIUM 5 MG PO TABS
10.0000 mg | ORAL_TABLET | Freq: Every day | ORAL | Status: DC
  Administered 2023-03-21 – 2023-03-22 (×2): 10 mg via ORAL
  Filled 2023-03-21 (×2): qty 2

## 2023-03-21 MED ORDER — NICARDIPINE HCL IN NACL 20-0.86 MG/200ML-% IV SOLN
3.0000 mg/h | INTRAVENOUS | Status: DC
  Filled 2023-03-21: qty 200

## 2023-03-21 MED ORDER — SODIUM CHLORIDE 0.9% FLUSH: 3.0000 mL | INTRAVENOUS | Status: DC | PRN

## 2023-03-21 MED ORDER — DOXAZOSIN MESYLATE 8 MG PO TABS
8.0000 mg | ORAL_TABLET | Freq: Two times a day (BID) | ORAL | Status: DC
  Administered 2023-03-21 – 2023-03-22 (×3): 8 mg via ORAL
  Filled 2023-03-21 (×4): qty 1

## 2023-03-21 MED ORDER — FUROSEMIDE 10 MG/ML IJ SOLN
40.0000 mg | Freq: Two times a day (BID) | INTRAMUSCULAR | Status: DC
  Administered 2023-03-21 – 2023-03-22 (×2): 40 mg via INTRAVENOUS
  Filled 2023-03-21 (×2): qty 4

## 2023-03-21 MED ORDER — ASPIRIN 81 MG PO TBEC
81.0000 mg | DELAYED_RELEASE_TABLET | Freq: Every day | ORAL | Status: DC
  Administered 2023-03-21 – 2023-03-22 (×2): 81 mg via ORAL
  Filled 2023-03-21 (×2): qty 1

## 2023-03-21 MED ORDER — LABETALOL HCL 5 MG/ML IV SOLN
10.0000 mg | Freq: Once | INTRAVENOUS | Status: AC
  Administered 2023-03-21: 10 mg via INTRAVENOUS

## 2023-03-21 MED ORDER — SODIUM CHLORIDE 0.9% FLUSH
3.0000 mL | Freq: Two times a day (BID) | INTRAVENOUS | Status: DC
  Administered 2023-03-21 – 2023-03-22 (×3): 3 mL via INTRAVENOUS

## 2023-03-21 MED ORDER — SODIUM CHLORIDE 0.9 % IV SOLN: 250.0000 mL | INTRAVENOUS | Status: DC | PRN

## 2023-03-21 MED ORDER — CLONIDINE HCL 0.1 MG PO TABS
0.1000 mg | ORAL_TABLET | Freq: Every morning | ORAL | Status: DC
  Administered 2023-03-22: 0.1 mg via ORAL
  Filled 2023-03-21: qty 1

## 2023-03-21 MED ORDER — NITROGLYCERIN IN D5W 200-5 MCG/ML-% IV SOLN
0.0000 ug/min | INTRAVENOUS | Status: DC
  Filled 2023-03-21: qty 250

## 2023-03-21 MED ORDER — CLONIDINE HCL 0.2 MG PO TABS
0.2000 mg | ORAL_TABLET | Freq: Every evening | ORAL | Status: DC
  Administered 2023-03-21: 0.2 mg via ORAL
  Filled 2023-03-21: qty 1

## 2023-03-21 MED ORDER — LABETALOL HCL 5 MG/ML IV SOLN
10.0000 mg | INTRAVENOUS | Status: DC | PRN
  Administered 2023-03-21 (×2): 10 mg via INTRAVENOUS
  Filled 2023-03-21 (×2): qty 4

## 2023-03-21 MED ORDER — LABETALOL HCL 5 MG/ML IV SOLN
10.0000 mg | Freq: Once | INTRAVENOUS | Status: DC
  Filled 2023-03-21: qty 4

## 2023-03-21 MED ORDER — ONDANSETRON HCL 4 MG/2ML IJ SOLN: 4.0000 mg | Freq: Four times a day (QID) | INTRAMUSCULAR | Status: DC | PRN

## 2023-03-21 NOTE — Progress Notes (Signed)
Patient's sar results resulted in negative. Verbal order to d/c airborne and contact isolation.

## 2023-03-21 NOTE — ED Notes (Signed)
Pt placed on 2 L O2 after satting at 89%

## 2023-03-21 NOTE — ED Triage Notes (Signed)
Pt reports, SHOB, non-productive cough when laying down, congestion for the last 3 days. Presents with swelling in feet. States she does not "feel sick" but was possibly exposed to covid   Reported Hx of kidney stents

## 2023-03-21 NOTE — Consult Note (Addendum)
Cardiology Consultation   Patient ID: Karen Jimenez MRN: [id number]; DOB: Jan 17, 1956  Admit date: 03/21/2023 Date of Consult: 03/21/2023  PCP:  Karen Hale, MD   [facility] Providers Cardiologist:  Karen Si, MD     Patient Profile:   Karen Jimenez is a 67 y.o. female with a hx of hypertension, renal artery stenosis s/p left renal artery stenting, CKD stage IIIB, HLD, prior tobacco use who is being seen 03/21/2023 for the evaluation of CHF, hypertensive urgency at the request of Dr. Lajuana Jimenez.  History of Present Illness:   Ms. Karen Jimenez is a 67 year old female with above medical history. Patient is followed by Dr. Jani Jimenez with the advanced hypertension clinic. Per chart review, patient previously underwent stenting of the left renal artery in 02/2022.  Follow-up renal artery Dopplers in 03/2022 revealed 1-59% stenosis bilaterally.  After left renal artery stenting, patient unfortunately was not able to wean off of as many medications as she had hoped.  She continued to follow-up with Dr. Randie Heinz with vascular surgery who recommended referral to the advanced hypertension clinic. She was first seen by Dr. Duke Salvia in 10/14/2022. Underwent echocardiogram on 12/13/22 that showed EF 55-60%, no regional wall motion abnormalities, mild LVH, grade I DD, normal RV function. Left renal artery ultrasound from 02/2023 showed >60% stenosis in the left renal artery. She continues to be followed by Vascular Surgery and is scheduled for renal angiography on 04/04/23.   Patient was last seen by Dr. Duke Salvia on 03/09/23. Her home SBP has been in the 150s-160s. In the clinic, her BP was elevated to 216/86. She had been taking 0.1 mg clonidine every AM and 0.2 mg every PM. Had only been taking amlodipine as needed for hypertensive emergencies. Continues to have significant lower extremity edema and was on lasix 40 mg daily. She was instructed to wear compression stockings and to elevate feet  when possible.   Patient presented to the ED at [facility] on 03/21/23 complaining of shortness of breath, non-productive cough, and congestion for the past 3 days. Labs in the ED showed Na 133, K 3.5, creatinine 2.49, WBC 9.5, hemoglobin 10.5, platelets 288. COVID negative. hsTn 13, 12. BNP 815.   On my interview, patient reports that she is feeling fine this afternoon.  Reports that yesterday evening and this morning, she had a very hard time breathing.  She has not taken her oral Lasix since Thursday 9/5.  Reports that when on oral Lasix she had urinary incontinence and felt like she was urinating all day.  Since she stopped taking her Lasix, she noticed that she had progressive dyspnea on exertion.  Last night, she started to have dyspnea when at rest.  Also noted orthopnea, PND overnight.  When she woke up this morning, she continued to have shortness of breath and decided to come to the ER.  Reports having a dry cough, feels like she is trying to clear her lungs.  Denies ankle swelling, and only has ankle swelling when she takes amlodipine.  While in the ER, she received a dose of IV Lasix.  Reported significant urine output.  Her breathing has significantly improved and she feels back to normal.  Past Medical History:  Diagnosis Date   Anxiety 12/29/2017   Benign hypertension with chronic kidney disease, stage III (HCC) 02/25/2020   History of smoking    Hx of hyperlipidemia    Hyperlipidemia    Hypertension    Hyponatremia 02/13/2020   Left renal  artery stenosis (HCC) 02/13/2020   Low back pain 04/18/2020   Prediabetes 01/21/2017   SVT (supraventricular tachycardia) 01/21/2017   Vitamin D deficiency disease 01/21/2017    Past Surgical History:  Procedure Laterality Date   APPENDECTOMY     KNEE ARTHROSCOPY Right 2011, 2012   MEDIAL PARTIAL KNEE REPLACEMENT  11/2010   PERIPHERAL VASCULAR INTERVENTION  02/15/2022   Procedure: PERIPHERAL VASCULAR INTERVENTION;  Surgeon: Karen Harman, MD;  Location: [facility] INVASIVE CV LAB;  Service: Cardiovascular;;  LT Renal   RENAL ANGIOGRAPHY N/A 02/15/2022   Procedure: RENAL ANGIOGRAPHY;  Surgeon: Karen Harman, MD;  Location: [facility] INVASIVE CV LAB;  Service: Cardiovascular;  Laterality: N/A;   TONSILLECTOMY AND ADENOIDECTOMY       Home Medications:  Prior to Admission medications   Medication Sig Start Date End Date Taking? Authorizing Provider  aspirin EC 81 MG tablet Take 81 mg by mouth daily. Swallow whole.    [provider]  carvedilol (COREG) 25 MG tablet Take 1 tablet (25 mg total) by mouth 2 (two) times daily. 11/09/22 03/09/23  Karen Si, MD  cloNIDine (CATAPRES) 0.1 MG tablet Take 0.1 mg by mouth in the morning.    [provider]  cloNIDine (CATAPRES) 0.2 MG tablet Take 0.2 mg by mouth every evening.    [provider]  clopidogrel (PLAVIX) 75 MG tablet Take 1 tablet (75 mg total) by mouth daily. 03/09/23   Karen Harman, MD  doxazosin (CARDURA) 8 MG tablet Take 8 mg by mouth 2 (two) times daily. 03/03/23   [provider]  furosemide (LASIX) 40 MG tablet Take 40 mg by mouth daily. 09/23/22   [provider]  rosuvastatin (CRESTOR) 10 MG tablet Take 1 tablet (10 mg total) by mouth daily. 03/09/23   Karen Harman, MD    Inpatient Medications: Scheduled Meds:  aspirin EC  81 mg Oral Daily   carvedilol  25 mg Oral BID   [START ON 03/22/2023] cloNIDine  0.1 mg Oral q AM   cloNIDine  0.2 mg Oral QPM   clopidogrel  75 mg Oral Daily   doxazosin  8 mg Oral BID   enoxaparin (LOVENOX) injection  40 mg Subcutaneous Q24H   furosemide  40 mg Intravenous Q12H   rosuvastatin  10 mg Oral Daily   sodium chloride flush  3 mL Intravenous Q12H   Continuous Infusions:  sodium chloride     niCARDipine     PRN Meds: sodium chloride, ondansetron (ZOFRAN) IV, sodium chloride flush  Allergies:    Allergies  Allergen Reactions   Amoxicillin Hives    Losartan     Other reaction(s): Other (See Comments) Severe AKI   Pseudoephedrine Hcl Palpitations    Heart rate increases   Hydralazine Nausea Only and Rash   Amlodipine Swelling   Hydrocodone Nausea And Vomiting    Headache   Hydrocodone-Acetaminophen     Other reaction(s): nausea/vomiting   Nitrofurantoin Other (See Comments)    Body aches    Social History:   Social History   Socioeconomic History   Marital status: Married    Spouse name: Not on file   Number of children: 2   Years of education: Not on file   Highest education level: Not on file  Occupational History    Employer: [employer]  Tobacco Use   Smoking status: Former    Current packs/day: 0.50    Average packs/day: 0.5 packs/day for 30.0 years (15.0 ttl pk-yrs)  Types: Cigarettes   Smokeless tobacco: Never  Vaping Use   Vaping status: Never Used  Substance and Sexual Activity   Alcohol use: No   Drug use: No   Sexual activity: Never    Partners: Male    Birth control/protection: Post-menopausal  Other Topics Concern   Not on file  Social History Narrative   Not on file   Social Determinants of Health   Financial Resource Strain: Not on file  Food Insecurity: No Food Insecurity (03/21/2023)   Hunger Vital Sign    Worried About Running Out of Food in the Last Year: Never true    Ran Out of Food in the Last Year: Never true  Transportation Needs: No Transportation Needs (03/21/2023)   PRAPARE - Administrator, Civil Service (Medical): No    Lack of Transportation (Non-Medical): No  Physical Activity: Inactive (11/09/2022)   Exercise Vital Sign    Days of Exercise per Week: 0 days    Minutes of Exercise per Session: 0 min  Stress: Not on file  Social Connections: Not on file  Intimate Partner Violence: Not At Risk (03/21/2023)   Humiliation, Afraid, Rape, and Kick questionnaire    Fear of Current or Ex-Partner: No    Emotionally Abused: No    Physically Abused: No    Sexually  Abused: No    Family History:    Family History  Problem Relation Age of Onset   Colon cancer Mother 69   Lung cancer Father 12     ROS:  Please see the history of present illness.   All other ROS reviewed and negative.     Physical Exam/Data:   Vitals:   03/21/23 1000 03/21/23 1030 03/21/23 1200 03/21/23 1321  BP: (!) 202/88 (!) 203/90 (!) 207/86 (!) 205/84  Pulse: 65 67 69 76  Resp: 19 (!) 22 13 18   Temp: 97.7 F (36.5 C)  97.6 F (36.4 C) 97.9 F (36.6 C)  TempSrc: Oral  Oral Oral  SpO2: 97% 94% 97% 96%  Weight:      Height:        Intake/Output Summary (Last 24 hours) at 03/21/2023 1335 Last data filed at 03/21/2023 1317 Gross per 24 hour  Intake --  Output 600 ml  Net -600 ml      03/21/2023    6:20 AM 03/09/2023   10:59 AM 03/09/2023    8:31 AM  Last 3 Weights  Weight (lbs) 173 lb 176 lb 12.8 oz 175 lb 3.2 oz  Weight (kg) 78.472 kg 80.196 kg 79.47 kg     Body mass index is 29.7 kg/m.  General:  Well nourished, well developed, in no acute distress. Sitting comfortably in the bed  HEENT: normal Neck: no JVD Vascular: Radial pulses 2+ bilaterally Cardiac:  normal S1, S2; RRR; no murmur  Lungs:  Crackles in bilateral lung bases, normal work of breathing on room air  Abd: soft, nontender, no hepatomegaly  Ext: no edema in BLE  Musculoskeletal:  No deformities, BUE and BLE strength normal and equal Skin: warm and dry  Neuro:  CNs 2-12 intact, no focal abnormalities noted Psych:  Normal affect   EKG:  The EKG was personally reviewed and demonstrates:  NSR, HR 79 BPM, nonspecific T wave changes in lateral leads  Telemetry:  Telemetry was personally reviewed and demonstrates:  NSR  Relevant CV Studies:   Laboratory Data:  High Sensitivity Troponin:   Recent Labs  Lab 03/21/23 0633 03/21/23 6387  TROPONINIHS 13 12     Chemistry Recent Labs  Lab 03/21/23 0633  NA 133*  K 3.5  CL 100  CO2 22  GLUCOSE 121*  BUN 47*  CREATININE 2.49*  CALCIUM  8.9  GFRNONAA 21*  ANIONGAP 11    No results for input(s): "PROT", "ALBUMIN", "AST", "ALT", "ALKPHOS", "BILITOT" in the last 168 hours. Lipids No results for input(s): "CHOL", "TRIG", "HDL", "LABVLDL", "LDLCALC", "CHOLHDL" in the last 168 hours.  Hematology Recent Labs  Lab 03/21/23 0633  WBC 9.5  RBC 3.85*  HGB 10.5*  HCT 31.3*  MCV 81.3  MCH 27.3  MCHC 33.5  RDW 15.4  PLT 288   Thyroid No results for input(s): "TSH", "FREET4" in the last 168 hours.  BNP Recent Labs  Lab 03/21/23 0633  BNP 815.8*    DDimer No results for input(s): "DDIMER" in the last 168 hours.   Radiology/Studies:  DG Chest Port 1 View  Result Date: 03/21/2023 CLINICAL DATA:  67 year old female with shortness of breath. EXAM: PORTABLE CHEST 1 VIEW COMPARISON:  CT Abdomen and Pelvis 06/27/2022. FINDINGS: Portable AP upright view at 0641 hours. Cardiac size appears larger than the CT last year, borderline cardiomegaly now. Other mediastinal contours are within normal limits. Diffuse increased pulmonary interstitium with basilar predominance and indistinct appearance of the vasculature. Superimposed bibasilar veiling opacity, likely small pleural effusions. No air bronchograms. No pneumothorax. Visualized tracheal air column is within normal limits. No acute osseous abnormality identified. Paucity of bowel gas in the visible abdomen. IMPRESSION: Abnormal bilateral lung opacity most suggestive of Acute pulmonary edema with layering pleural effusions. Viral/atypical respiratory infection with effusions felt less likely. Electronically Signed   By: Odessa Fleming M.D.   On: 03/21/2023 06:52     Assessment and Plan:   Acute on Chronic Diastolic Heart Failure  - Echocardiogram from 12/2022 showed EF 55-60%, no wall motion abnormalities, mild LVH, grade I DD, normal RV function  - Now patient presented to ED complaining of shortness of breath, orthopnea, cough. Patient has chronic lower extremity edema, thought to be due to  amlodipine use and kidney disease - BNP elevated to 815. CXR suggestive of acute pulmonary edema with layering pleural effusions  - Patient was given IV lasix 40 mg this AM- output 0.6 L urine. Reports improvement in symptoms and she feels back to baseline  - Received second dose of IV lasix at 2:24 this afternoon- she is very close to euvolemic. Suspect she can be transitioned back to PO lasix tomorrow. - Baseline creatinine around 2.5-2.8. Creatinine 2.49 today. Needs BMP in the AM.  Note, she is currently refusing to have labs drawn, so I do not want to be aggressive with diuresis  - Strict I/Os, daily weights   HTN Urgency  - Patient is followed by Dr. Duke Salvia with the HTN clinic  - Current home regiment includes carvedilol 25 mg BID, clonidine 0.1 mg every AM and 0.2 mg every PM, doxazosin 8 mg BID. Also takes amlodipine as needed for HTN emergency (does not take daily due to ankle edema) - In the past, patient had hyperkalemia with spironolactone. Medication options also limited by renal function (baseline creatinine 2.5-2.8)  - BP as high as 207/96 this admission - patient is asymptomatic  - Patient did not take AM carvedilol- received this afternoon - Follow BP after receiving carvedilol. Pending BP response, consider starting hydralazine 50 mg TID. Patient has documented allergy to hydralazine (nausea), but she cannot recall having a reaction to  hydralazine and she cannot recall why the medication as stopped. Could also start amlodipine daily until her renal artery intervention   Renal Artery stenosis  - Followed by Dr. Randie Heinz with Vascular Surgery- previously had stent placed to left renal artery in 02/2022.  - Renal artery ultrasound in 02/2023 suggested severe stenosis in the left renal artery  - Continue plavix  - Patient is scheduled for renal angiography on 9/24. Suspect renal artery stenosis is contributing to HTN. May be helpful to get procedure done this admission   CKD stage  IV - eGFR 21 today. Creatinine 2.49  - Avoid nephrotoxic medications  - Nephrology was consulted by primary team     Risk Assessment/Risk Scores:       New York Heart Association (NYHA) Functional Class NYHA Class IV        For questions or updates, please contact [facility] Please consult [email or web address] for contact info under    Signed, Jonita Albee, PA-C  03/21/2023 1:35 PM  Patient seen, examined. Available data reviewed. Agree with findings, assessment, and plan as outlined by Marijean Niemann, PA-C.  The patient is independently interviewed and evaluated.  She comes in with hypertensive emergency and heart failure after discontinuing amlodipine and furosemide.  Amlodipine was stopped because of lower extremity edema and furosemide was stopped because of urgency and incontinence.  After several days she developed symptoms of shortness of breath and orthopnea.  She has done very well with IV diuresis and is feeling back to baseline.  On my exam today, she is alert, oriented, in no distress.  HEENT is normal, JVP is normal, carotid upstrokes are normal without bruits, lungs are clear bilaterally, heart is regular rate and rhythm with no murmur gallop, abdomen is soft and nontender, extremities have no pretibial or ankle edema, skin is warm and dry with no rash.  I reviewed nephrology note by Dr. Glenna Fellows.  Plan to resume amlodipine 10 mg daily.  Would continue furosemide 40 mg daily.  The patient has a tight left renal artery restenotic lesion with a high velocity jet greater than 7 m/s.  She is scheduled for renal artery angioplasty in 2 weeks.  She has stabilized on medical therapy.  I think it is important that she stay on a loop diuretic as tolerated.  We discussed the fact that it is okay for her to skip a day on occasion if she has a lot of things planned, but she should take this at least 5 days/week at a minimum.  Otherwise as outlined above.  Continue multidrug therapy with  doxazosin, clonidine, carvedilol, and amlodipine.  Multiple intolerances noted above.  Tonny Bollman, M.D. 03/21/2023 5:39 PM

## 2023-03-21 NOTE — Progress Notes (Signed)
   03/21/23 1321  Assess: MEWS Score  Temp 97.9 F (36.6 C)  BP (!) 205/84  MAP (mmHg) 116  Pulse Rate 76  ECG Heart Rate 76  Resp 18  Level of Consciousness Alert  SpO2 96 %  O2 Device Room Air  Assess: MEWS Score  MEWS Temp 0  MEWS Systolic 2  MEWS Pulse 0  MEWS RR 0  MEWS LOC 0  MEWS Score 2  MEWS Score Color Yellow  Assess: if the MEWS score is Yellow or Red  Were vital signs accurate and taken at a resting state? Yes  Does the patient meet 2 or more of the SIRS criteria? No  MEWS guidelines implemented  Yes, yellow  Treat  MEWS Interventions Considered administering scheduled or prn medications/treatments as ordered  Take Vital Signs  Increase Vital Sign Frequency  Yellow: Q2hr x1, continue Q4hrs until patient remains green for 12hrs  Escalate  MEWS: Escalate Yellow: Discuss with charge nurse and consider notifying provider and/or RRT  Notify: Charge Nurse/RN  Name of Charge Nurse/RN Notified Lafonda Mosses, RN  Assess: SIRS CRITERIA  SIRS Temperature  0  SIRS Pulse 0  SIRS Respirations  0  SIRS WBC 0  SIRS Score Sum  0

## 2023-03-21 NOTE — Consult Note (Signed)
[facility]  INPATIENT CONSULTATION  Reason for Consultation: hypertension Requesting Provider: Dr. Lajuana Ripple  HPI: Karen Jimenez is an 67 y.o. 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   Presented ED today with dyspnea.  Stopped lasix 9/6 due to urinary frequency and incontinent.  BPs have been in the 200s, O2 sats ok on RA.  Labs showing Na 133, K 3.5, Bicarb 22, BUN 47, Cr 2.5, BNP 815,  Hb 10.5.  CXR pulm edema with effusions versus atypical PNA.  Home meds include clonidine 0.1 qam/0.2 qpm, coreg 25 BID, cardura 8 BID, lasix 40 daily (none since 9/5). Also too amlodipine 10 this AM (had been stopped due to LE edema previously).    Followed by Cardiology advanced HTN.  Followed by Dr. Ross Ludwig.   Dr. Randie Heinz did RAS.   Pt noted marked improvement in BPs after RAS done initially.  Recently BPs have been much more difficult to control and labile.   Asymptomatic HTN - commonly in 200s recently.   Repeat renal artery duplex recently with recurrent RAS  -  intervention scheduled 04/04/23 w Dr. Randie Heinz.  Patient believes BPs will be tolerable with amlodipine which she's willing to take until 04/04/23 if needed.  Has painful edema at work if she does take it.  Longstanding urinary frequency and incont since lasix added; no recent increase in symptoms nor dysuria.  She stopped it due to annoyance of urinary symptoms.   She observes low sodium diet.   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   PMH: Past Medical History:  Diagnosis Date   Anxiety 12/29/2017   Benign hypertension with chronic kidney disease, stage III (HCC) 02/25/2020   History of smoking    Hx of hyperlipidemia    Hyperlipidemia    Hypertension    Hyponatremia 02/13/2020   Left renal artery stenosis (HCC) 02/13/2020   Low back pain 04/18/2020   Prediabetes 01/21/2017   SVT (supraventricular tachycardia)  01/21/2017   Vitamin D deficiency disease 01/21/2017   PSH: Past Surgical History:  Procedure Laterality Date   APPENDECTOMY     KNEE ARTHROSCOPY Right 2011, 2012   MEDIAL PARTIAL KNEE REPLACEMENT  11/2010   PERIPHERAL VASCULAR INTERVENTION  02/15/2022   Procedure: PERIPHERAL VASCULAR INTERVENTION;  Surgeon: Maeola Harman, MD;  Location: [facility] INVASIVE CV LAB;  Service: Cardiovascular;;  LT Renal   RENAL ANGIOGRAPHY N/A 02/15/2022   Procedure: RENAL ANGIOGRAPHY;  Surgeon: Maeola Harman, MD;  Location: [facility] INVASIVE CV LAB;  Service: Cardiovascular;  Laterality: N/A;   TONSILLECTOMY AND ADENOIDECTOMY      Past Medical History:  Diagnosis Date   Anxiety 12/29/2017   Benign hypertension with chronic kidney disease, stage III (HCC) 02/25/2020   History of smoking    Hx of hyperlipidemia    Hyperlipidemia    Hypertension    Hyponatremia 02/13/2020   Left renal artery stenosis (HCC) 02/13/2020   Low back pain 04/18/2020   Prediabetes 01/21/2017   SVT (supraventricular tachycardia) 01/21/2017   Vitamin D deficiency disease 01/21/2017    Medications:  I have reviewed the patient's current medications.  Medications Prior to Admission  Medication Sig Dispense Refill   aspirin EC 81 MG tablet Take 81 mg by mouth daily. Swallow whole.     carvedilol (COREG) 25 MG tablet Take 1 tablet (25 mg total) by mouth 2 (two) times daily.  180 tablet 3   cloNIDine (CATAPRES) 0.1 MG tablet Take 0.1 mg by mouth in the morning.     cloNIDine (CATAPRES) 0.2 MG tablet Take 0.2 mg by mouth every evening.     clopidogrel (PLAVIX) 75 MG tablet Take 1 tablet (75 mg total) by mouth daily. 30 tablet 6   doxazosin (CARDURA) 8 MG tablet Take 8 mg by mouth 2 (two) times daily.     furosemide (LASIX) 40 MG tablet Take 40 mg by mouth daily.     rosuvastatin (CRESTOR) 10 MG tablet Take 1 tablet (10 mg total) by mouth daily. 30 tablet 11    ALLERGIES:   Allergies  Allergen Reactions    Amoxicillin Hives   Losartan     Other reaction(s): Other (See Comments) Severe AKI   Pseudoephedrine Hcl Palpitations    Heart rate increases   Hydralazine Nausea Only and Rash   Amlodipine Swelling   Hydrocodone Nausea And Vomiting    Headache   Hydrocodone-Acetaminophen     Other reaction(s): nausea/vomiting   Nitrofurantoin Other (See Comments)    Body aches    FAM HX: Family History  Problem Relation Age of Onset   Colon cancer Mother 58   Lung cancer Father 10    Social History:   reports that she has quit smoking. Her smoking use included cigarettes. She has a 15 pack-year smoking history. She has never used smokeless tobacco. She reports that she does not drink alcohol and does not use drugs.  ROS: 12 system ROS neg except per HPI  Blood pressure (!) 198/84, pulse 76, temperature 98.2 F (36.8 C), temperature source Oral, resp. rate 18, height 5\' 4"  (1.626 m), weight 78.5 kg, last menstrual period 04/11/2009, SpO2 95%. PHYSICAL EXAM: Gen: appears well sitting in bed calmly, well groomed  Eyes: anicteric ENT: MMM Neck: supple, no bruits CV:  RRR, no rub Abd: soft, no abd bruit appreciated Lungs: basilar rales but on RA with normal WOB GU: no foley Extr:  no edema Neuro: nonfocal    Results for orders placed or performed during the hospital encounter of 03/21/23 (from the past 48 hour(s))  Brain natriuretic peptide     Status: Abnormal   Collection Time: 03/21/23  6:33 AM  Result Value Ref Range   B Natriuretic Peptide 815.8 (H) 0.0 - 100.0 pg/mL    Comment: Performed at [facility], [address] [place], [place] [address]  Basic metabolic panel     Status: Abnormal   Collection Time: 03/21/23  6:33 AM  Result Value Ref Range   Sodium 133 (L) 135 - 145 mmol/L   Potassium 3.5 3.5 - 5.1 mmol/L   Chloride 100 98 - 111 mmol/L   CO2 22 22 - 32 mmol/L   Glucose, Bld 121 (H) 70 - 99 mg/dL    Comment: Glucose reference range applies only to  samples taken after fasting for at least 8 hours.   BUN 47 (H) 8 - 23 mg/dL   Creatinine, Ser 1.91 (H) 0.44 - 1.00 mg/dL   Calcium 8.9 8.9 - 47.8 mg/dL   GFR, Estimated 21 (L) >60 mL/min    Comment: (NOTE) Calculated using the CKD-EPI Creatinine Equation (2021)    Anion gap 11 5 - 15    Comment: Performed at [facility], [address] [place], [place] [address]  CBC with Differential     Status: Abnormal   Collection Time: 03/21/23  6:33 AM  Result  Value Ref Range   WBC 9.5 4.0 - 10.5 K/uL   RBC 3.85 (L) 3.87 - 5.11 MIL/uL   Hemoglobin 10.5 (L) 12.0 - 15.0 g/dL   HCT 09.8 (L) 11.9 - 14.7 %   MCV 81.3 80.0 - 100.0 fL   MCH 27.3 26.0 - 34.0 pg   MCHC 33.5 30.0 - 36.0 g/dL   RDW 82.9 56.2 - 13.0 %   Platelets 288 150 - 400 K/uL   nRBC 0.0 0.0 - 0.2 %   Neutrophils Relative % 76 %   Neutro Abs 7.3 1.7 - 7.7 K/uL   Lymphocytes Relative 14 %   Lymphs Abs 1.3 0.7 - 4.0 K/uL   Monocytes Relative 6 %   Monocytes Absolute 0.6 0.1 - 1.0 K/uL   Eosinophils Relative 2 %   Eosinophils Absolute 0.2 0.0 - 0.5 K/uL   Basophils Relative 1 %   Basophils Absolute 0.1 0.0 - 0.1 K/uL   Immature Granulocytes 1 %   Abs Immature Granulocytes 0.05 0.00 - 0.07 K/uL    Comment: Performed at [facility], [address] [place], [place] [address]  Troponin I (High Sensitivity)     Status: None   Collection Time: 03/21/23  6:33 AM  Result Value Ref Range   Troponin I (High Sensitivity) 13 <18 ng/L    Comment: (NOTE) Elevated high sensitivity troponin I (hsTnI) values and significant  changes across serial measurements may suggest ACS but many other  chronic and acute conditions are known to elevate hsTnI results.  Refer to the "Links" section for chest pain algorithms and additional  guidance. Performed at [facility], [address] [place], [place] [address]   SARS Coronavirus 2 by RT PCR (hospital order, performed in [facility] hospital lab) *cepheid  single result test* Anterior Nasal Swab     Status: None   Collection Time: 03/21/23  6:33 AM   Specimen: Anterior Nasal Swab  Result Value Ref Range   SARS Coronavirus 2 by RT PCR NEGATIVE NEGATIVE    Comment: (NOTE) SARS-CoV-2 target nucleic acids are NOT DETECTED.  The SARS-CoV-2 RNA is generally detectable in upper and lower respiratory specimens during the acute phase of infection. The lowest concentration of SARS-CoV-2 viral copies this assay can detect is 250 copies / mL. A negative result does not preclude SARS-CoV-2 infection and should not be used as the sole basis for treatment or other patient management decisions.  A negative result may occur with improper specimen collection / handling, submission of specimen other than nasopharyngeal swab, presence of viral mutation(s) within the areas targeted by this assay, and inadequate number of viral copies (<250 copies / mL). A negative result must be combined with clinical observations, patient history, and epidemiological information.  Fact Sheet for Patients:   RoadLapTop.co.za  Fact Sheet for Healthcare Providers: http://kim-miller.com/  This test is not yet approved or  cleared by the [place] FDA and has been authorized for detection and/or diagnosis of SARS-CoV-2 by FDA under an Emergency Use Authorization (EUA).  This EUA will remain in effect (meaning this test can be used) for the duration of the COVID-19 declaration under Section 564(b)(1) of the Act, 21 U.S.C. section 360bbb-3(b)(1), unless the authorization is terminated or revoked sooner.  Performed at [facility], [address] [place], [place] [address]   Troponin I (High Sensitivity)     Status: None   Collection Time: 03/21/23  9:39 AM  Result  Value Ref Range   Troponin I (High Sensitivity) 12 <18 ng/L    Comment: (NOTE) Elevated high sensitivity troponin I (hsTnI) values and significant   changes across serial measurements may suggest ACS but many other  chronic and acute conditions are known to elevate hsTnI results.  Refer to the "Links" section for chest pain algorithms and additional  guidance. Performed at [facility], [address] [place], [place] [address]     DG Chest [place] 1 View  Result Date: 03/21/2023 CLINICAL DATA:  67 year old female with shortness of breath. EXAM: PORTABLE CHEST 1 VIEW COMPARISON:  CT Abdomen and Pelvis 06/27/2022. FINDINGS: Portable AP upright view at 0641 hours. Cardiac size appears larger than the CT last year, borderline cardiomegaly now. Other mediastinal contours are within normal limits. Diffuse increased pulmonary interstitium with basilar predominance and indistinct appearance of the vasculature. Superimposed bibasilar veiling opacity, likely small pleural effusions. No air bronchograms. No pneumothorax. Visualized tracheal air column is within normal limits. No acute osseous abnormality identified. Paucity of bowel gas in the visible abdomen. IMPRESSION: Abnormal bilateral lung opacity most suggestive of Acute pulmonary edema with layering pleural effusions. Viral/atypical respiratory infection with effusions felt less likely. Electronically Signed   By: Odessa Fleming M.D.   On: 03/21/2023 06:52    Assessment/Plan **Hypertension, resistant in the setting of known L RAS:  recent arterial duplex suggests recurrent RAS and plan was repeating angiogram with CO2.  For now medically managing HTN with oral and IV medications.  Add standing amlodipine 10 daily -- side effect of edema; can deal with this in short term. Diuresing as well.   After reviewing chart have D/Cd NTG gtt -- will see how she does with po meds; feel high chance of overshooting and causing hypotension with adverse effects.   Recommended we consult VVS - pt would benefit from having this issue addressed while hospitalized BUT she is quite reluctant and wishes to have  addressed 04/04/23 as already scheduled.  We agreed to see how BPs are doing overnight and readdress tomorrow.   Cont statin and plavix.   **CKD 3b:  progressive kidney disease which has accelerated a bit recently with eGFRs in the high teens to low 20s.  Suspect this is multifactorial with contributions of uncontrolled HTN and worsening RAS.  Avoid marked drops in BP - would target now lower than 140 at this point.  Avoid nephrotoxins as able - if iodinated contrast unavoidable for RAS tx would use smallest amount possible.  Already volume overloaded - no role for pre contrast hydration.   **LUTS: urinary frequency and incont led to self d/c diuretics recently. Discussed she may need to continue at least until RAS addressed.  IV lasix for now.   **Hyponatremia:  mild, appearing hypervolemic.  Follow with diuresis.  **Anemia: mild, MCV 81.  Check iron indices.    **HL: on statin.   Will follow - call with questions.  Tyler Pita 03/21/2023, 3:14 PM

## 2023-03-21 NOTE — ED Notes (Signed)
Ambulated pt. Without difficulty O2 sats remained between 95-97%

## 2023-03-21 NOTE — H&P (Addendum)
History and Physical    DOA: 03/21/2023  PCP: Shon Hale, MD  Patient coming from: Home--[facility]  Chief Complaint: Shortness of breath   HPI: Karen Jimenez is a 67 y.o. female with history h/o hypertension, renal artery stenosis status post left renal artery stenting, CKD 3B, hyperlipidemia, prior tobacco abuse, here for c/o Shortness of breath. Patient states she stopped taking Lasix on Thursday ( 9/6) due to urinary frequency/incontinence.  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  Is able to take about 20 stairs up-and-down every day to get to her office without feeling short of breath.  She states urinary frequency was interfering with her ability to work without interruption which prompted her to hold Lasix subsequent to which she did feel better.  She started feeling short of breath this weekend which progressively worsened to dyspnea on minimal exertion (short of breath when walking to her bathroom) as well as orthopnea last night.  She states her leg swellings had improved since she stopped taking amlodipine on a daily basis and was taking only as needed per cardiology advice.  She had taken 1 dose of amlodipine 10 mg couple of days back while she was waiting in nephrology office and her systolic blood pressure was greater than 200.  She took 1 dose this morning as well.  Of note, she has history of bilateral renal artery stenosis and she underwent stenting of the left renal artery 02/2022.  She was first seen in advanced hypertension clinic 10/2022.   ED course: Patient presented to [facility] with above complaints, afebrile, no hypoxia, noted to be significantly hypertensive with systolic blood pressure greater than 200 and diastolic greater than 100.  She got 2 doses of IV labetalol, IV Lasix 40 mg in addition to Norvasc 10 mg as well as home dose of clonidine.  Upon transfer here, patient still hypertensive with BP 211/96  while I was in the room.  She however feels much better in terms of shortness of breath and ambulating in the room comfortably, saturating 95% on room air.  She denies any chest pain, fever or chills.  She reports following [facility] cardiology and Dr. Signe Colt with [facility] for blood pressure management.  She has allergies/intolerances listed to multiple antihypertensives.  Lab work revealed WBC 9.5, hemoglobin 10.5, hematocrit 31.3, sodium 133, potassium 3.5, chloride 100, bicarb 22, BUN 47, creatinine 2.49, calcium 8.9, high-sensitivity troponin 12-13, glucose 121, BNP 815.  EKG unremarkable.      Review of Systems: As per HPI, otherwise review of systems negative.    Past Medical History:  Diagnosis Date   Anxiety 12/29/2017   Benign hypertension with chronic kidney disease, stage III (HCC) 02/25/2020   History of smoking    Hx of hyperlipidemia    Hyperlipidemia    Hypertension    Hyponatremia 02/13/2020   Left renal artery stenosis (HCC) 02/13/2020   Low back pain 04/18/2020   Prediabetes 01/21/2017   SVT (supraventricular tachycardia) 01/21/2017   Vitamin D deficiency disease 01/21/2017    Past Surgical History:  Procedure Laterality Date   APPENDECTOMY     KNEE ARTHROSCOPY Right 2011, 2012   MEDIAL PARTIAL KNEE REPLACEMENT  11/2010   PERIPHERAL VASCULAR INTERVENTION  02/15/2022   Procedure: PERIPHERAL VASCULAR INTERVENTION;  Surgeon: Maeola Harman, MD;  Location: [facility] INVASIVE CV LAB;  Service: Cardiovascular;;  LT Renal   RENAL ANGIOGRAPHY N/A 02/15/2022  Procedure: RENAL ANGIOGRAPHY;  Surgeon: Maeola Harman, MD;  Location: [facility] INVASIVE CV LAB;  Service: Cardiovascular;  Laterality: N/A;   TONSILLECTOMY AND ADENOIDECTOMY      Social history:  reports that she has quit smoking. Her smoking use included cigarettes. She has a 15 pack-year smoking history. She has never used smokeless tobacco. She reports that she does not drink alcohol and does not  use drugs.   Allergies  Allergen Reactions   Amoxicillin Hives   Losartan     Other reaction(s): Other (See Comments) Severe AKI   Pseudoephedrine Hcl Palpitations    Heart rate increases   Hydralazine Nausea Only and Rash   Amlodipine Swelling   Hydrocodone Nausea And Vomiting    Headache   Hydrocodone-Acetaminophen     Other reaction(s): nausea/vomiting   Nitrofurantoin Other (See Comments)    Body aches    Family History  Problem Relation Age of Onset   Colon cancer Mother 62   Lung cancer Father 47      Prior to Admission medications   Medication Sig Start Date End Date Taking? Authorizing Provider  aspirin EC 81 MG tablet Take 81 mg by mouth daily. Swallow whole.    [provider]  carvedilol (COREG) 25 MG tablet Take 1 tablet (25 mg total) by mouth 2 (two) times daily. 11/09/22 03/09/23  Chilton Si, MD  cloNIDine (CATAPRES) 0.1 MG tablet Take 0.1 mg by mouth in the morning.    [provider]  cloNIDine (CATAPRES) 0.2 MG tablet Take 0.2 mg by mouth every evening.    [provider]  clopidogrel (PLAVIX) 75 MG tablet Take 1 tablet (75 mg total) by mouth daily. 03/09/23   Maeola Harman, MD  doxazosin (CARDURA) 8 MG tablet Take 8 mg by mouth 2 (two) times daily. 03/03/23   [provider]  furosemide (LASIX) 40 MG tablet Take 40 mg by mouth daily. 09/23/22   [provider]  rosuvastatin (CRESTOR) 10 MG tablet Take 1 tablet (10 mg total) by mouth daily. 03/09/23   Maeola Harman, MD    Physical Exam: Vitals:   03/21/23 0830 03/21/23 1000 03/21/23 1030 03/21/23 1200  BP: (!) 189/82 (!) 202/88 (!) 203/90 (!) 207/86  Pulse: 66 65 67 69  Resp: 19 19 (!) 22 13  Temp:  97.7 F (36.5 C)  97.6 F (36.4 C)  TempSrc:  Oral  Oral  SpO2: 95% 97% 94% 97%  Weight:      Height:        Constitutional: NAD, calm, comfortable Eyes: PERRL, lids and conjunctivae normal ENMT: Mucous membranes are moist.  Posterior pharynx clear of any exudate or lesions.Normal dentition.  Neck: normal, supple, no masses, no thyromegaly Respiratory: clear to auscultation bilaterally, no wheezing, no crackles. Normal respiratory effort. No accessory muscle use.  Cardiovascular: Regular rate and rhythm, no murmurs / rubs / gallops. No extremity edema. 2+ pedal pulses. No carotid bruits.  Abdomen: no tenderness, no masses palpated. No hepatosplenomegaly. Bowel sounds positive.  Musculoskeletal: no clubbing / cyanosis. No joint deformity upper and lower extremities. Good ROM, no contractures. Normal muscle tone.  Neurologic: CN 2-12 grossly intact. Sensation intact, DTR normal. Strength 5/5 in all 4.  Psychiatric: Normal judgment and insight. Alert and oriented x 3. Normal mood.  SKIN/catheters: no rashes, lesions, ulcers. No induration  Labs on Admission: I have personally reviewed following labs and imaging studies  CBC: Recent Labs  Lab 03/21/23 0633  WBC 9.5  NEUTROABS  7.3  HGB 10.5*  HCT 31.3*  MCV 81.3  PLT 288   Basic Metabolic Panel: Recent Labs  Lab 03/21/23 0633  NA 133*  K 3.5  CL 100  CO2 22  GLUCOSE 121*  BUN 47*  CREATININE 2.49*  CALCIUM 8.9   GFR: Estimated Creatinine Clearance: 22.5 mL/min (A) (by C-G formula based on SCr of 2.49 mg/dL (H)). Recent Labs  Lab 03/21/23 0633  WBC 9.5   Liver Function Tests: No results for input(s): "AST", "ALT", "ALKPHOS", "BILITOT", "PROT", "ALBUMIN" in the last 168 hours. No results for input(s): "LIPASE", "AMYLASE" in the last 168 hours. No results for input(s): "AMMONIA" in the last 168 hours. Coagulation Profile: No results for input(s): "INR", "PROTIME" in the last 168 hours. Cardiac Enzymes: No results for input(s): "CKTOTAL", "CKMB", "CKMBINDEX", "TROPONINI" in the last 168 hours. BNP (last 3 results) No results for input(s): "PROBNP" in the last 8760 hours. HbA1C: No results for input(s): "HGBA1C" in the last 72 hours. CBG: No  results for input(s): "GLUCAP" in the last 168 hours. Lipid Profile: No results for input(s): "CHOL", "HDL", "LDLCALC", "TRIG", "CHOLHDL", "LDLDIRECT" in the last 72 hours. Thyroid Function Tests: No results for input(s): "TSH", "T4TOTAL", "FREET4", "T3FREE", "THYROIDAB" in the last 72 hours. Anemia Panel: No results for input(s): "VITAMINB12", "FOLATE", "FERRITIN", "TIBC", "IRON", "RETICCTPCT" in the last 72 hours. Urine analysis:    Component Value Date/Time   COLORURINE YELLOW 06/27/2022 0916   APPEARANCEUR CLEAR 06/27/2022 0916   LABSPEC 1.015 06/27/2022 0916   PHURINE 5.5 06/27/2022 0916   GLUCOSEU NEGATIVE 06/27/2022 0916   HGBUR SMALL (A) 06/27/2022 0916   BILIRUBINUR NEGATIVE 06/27/2022 0916   BILIRUBINUR N 09/03/2016 1343   KETONESUR NEGATIVE 06/27/2022 0916   PROTEINUR 30 (A) 06/27/2022 0916   UROBILINOGEN negative 09/03/2016 1343   UROBILINOGEN 0.2 06/29/2014 1846   NITRITE NEGATIVE 06/27/2022 0916   LEUKOCYTESUR NEGATIVE 06/27/2022 0916    Radiological Exams on Admission: Personally reviewed  DG Chest Port 1 View  Result Date: 03/21/2023 CLINICAL DATA:  67 year old female with shortness of breath. EXAM: PORTABLE CHEST 1 VIEW COMPARISON:  CT Abdomen and Pelvis 06/27/2022. FINDINGS: Portable AP upright view at 0641 hours. Cardiac size appears larger than the CT last year, borderline cardiomegaly now. Other mediastinal contours are within normal limits. Diffuse increased pulmonary interstitium with basilar predominance and indistinct appearance of the vasculature. Superimposed bibasilar veiling opacity, likely small pleural effusions. No air bronchograms. No pneumothorax. Visualized tracheal air column is within normal limits. No acute osseous abnormality identified. Paucity of bowel gas in the visible abdomen. IMPRESSION: Abnormal bilateral lung opacity most suggestive of Acute pulmonary edema with layering pleural effusions. Viral/atypical respiratory infection with effusions  felt less likely. Electronically Signed   By: Odessa Fleming M.D.   On: 03/21/2023 06:52    EKG: Independently reviewed.  Normal sinus rhythm with nonspecific ST-T changes in lateral leads.  QTc 460 ms     Assessment and Plan:   Principal Problem:   Diastolic congestive heart failure (HCC)    1.  Hypertensive emergency: Patient followed by [facility] cardiology, nephrology and vascular surgery Dr. Randie Heinz for blockage in the renal artery stent causing refractory hypertension.  Per last cardiology note: Plan for possible stent replacement or balloon angioplasty pending insurance approval.  Remains on aspirin and Plavix to prevent stent occlusion. Amlodipine effective but causes significant peripheral edema per prior notes.  Will resume home antihypertensive regimen including Coreg, Doxazosin 8mg  twice daily and Clonidine 1mg  in the morning  and 2mg  at night.  Although I am concerned about current medications with both alpha and beta blockade effect (Coreg, labetalol, doxazosin, clonidine)-May need to consider calcium channel blockers like diltiazem since patient not tolerating amlodipine well or nitrates or potassium sparing diuretics as alternatives. Previous antihypertensives with adverse effects/recommendations: Hydralazine-nausea/rash Losartan-avoided with AKI Spironolactone- hyperkalemia Amlodipine -leg swelling, Use compression stockings and elevate feet when possible.   Plan for Cardene drip versus nitrates.  Discussed with nephrology Dr. Glenna Fellows covering for Dr. Signe Colt who recommended Tridil drip and will evaluate patient.  Also consulted cardiology to assist with management as well-known to them.  2.  Acute diastolic CHF: In the setting of problem #1 and noncompliance with home diuretic regimen.  BNP 815.  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  Will continue IV diuresis with Lasix 40 mg while here.  Resume diuretics upon  discharge.  Nitrates might help reduce pulmonary congestion as well.  3.  CKD stage IIIb: Creatinine currently stable at 2.4-2.6.  Continue to monitor with blood pressure changes.  Goal systolic BP 1 40-1 60 to avoid drastic drops.  Potassium 3.5.  4.  Chronic anemia in the setting of chronic kidney disease: Stable with no signs of active bleeding.  5.  Renal artery stenosis: Status post left renal artery stenting as discussed above.  Continue aspirin, Plavix and statins.  6.  Mild hyponatremia: Appears chronic and stable.  Likely in the setting of problem #2.  7. Hyperlipidemia: Resume statins.  DVT prophylaxis: Lovenox  COVID screen: Negative  Code Status: Full code as discussed with patient.  Patient/Family Communication: Discussed with patient and all questions answered to satisfaction.  Consults called: [facility] cardiology and [place] [facility] Admission status :I certify that at the point of admission it is my clinical judgment that the patient will require inpatient hospital care spanning beyond 2 midnights from the point of admission due to high intensity of service and high frequency of surveillance required.Inpatient status is judged to be reasonable and necessary in order to provide the required intensity of service to ensure the patient's safety. The patient's presenting symptoms, physical exam findings, and initial radiographic and laboratory data in the context of their chronic comorbidities is felt to place them at high risk for further clinical deterioration. The following factors support the patient status of inpatient : Refractory hypertension with acute diastolic CHF requiring Tridil drip and IV diuretics. Consider downgrade to observation if patient improves and able to discharge by tomorrow.     Alessandra Bevels MD [facility] Pager in [place]  If 7PM-7AM, please contact night-coverage [email or web address]   03/21/2023, 1:12 PM

## 2023-03-21 NOTE — Plan of Care (Signed)
[facility] transfer  [facility] HTN 2/2 renal artery stenosis s/p stenting, HLD, diastolic dysfunction, SVT, and CKD stage III who presented with complaints of SOB and LE swelling.  BP elevated up to 207/96.  Labs significant for BNP 815.8. CXR noted pulmonary edema and pleural effusions. COVID-19 negative. Tx Lasix 40 mg IV and labetalol 10 mg IV.  Orders placed to transfer to a telemetry bed

## 2023-03-21 NOTE — ED Provider Notes (Signed)
[facility] Provider Note   CSN: [id number] Arrival date & time: 03/21/23  8469     History  Chief Complaint  Patient presents with   Shortness of Breath    CYDNEY LAFLAMME is a 67 y.o. female.  67 year old female with past medical history of hypertension secondary to renal artery stenosis as well as hyperlipidemia presenting to the emergency department today with shortness of breath.  Patient has been having shortness of breath as well as orthopnea now for the past few days.  She reports dyspnea on exertion with this.  She reports that this was getting worse this morning and she is starting feel short of breath at rest.  She came to the ER today for further evaluation regarding this.  She was around a sick contact with COVID and did not know if this was the cause for her symptoms.  The patient reports a minimal nonproductive cough.  She denies a history of DVT or pulmonary embolism, recent surgeries, recent travel.  She denies any chest pain at this time.  She came to the ER today for further evaluation.  She is currently scheduled for a stent in her renal artery on the 23rd.  She has had a recent cardiac workup which was reassuring.  The history is provided by the patient.  Shortness of Breath      Home Medications Prior to Admission medications   Medication Sig Start Date End Date Taking? Authorizing Provider  aspirin EC 81 MG tablet Take 81 mg by mouth daily. Swallow whole.    [provider]  carvedilol (COREG) 25 MG tablet Take 1 tablet (25 mg total) by mouth 2 (two) times daily. 11/09/22 03/09/23  Chilton Si, MD  cloNIDine (CATAPRES) 0.1 MG tablet Take 0.1 mg by mouth in the morning.    [provider]  cloNIDine (CATAPRES) 0.2 MG tablet Take 0.2 mg by mouth every evening.    [provider]  clopidogrel (PLAVIX) 75 MG tablet Take 1 tablet (75 mg total) by mouth daily. 03/09/23   Maeola Harman, MD  doxazosin (CARDURA) 8 MG tablet Take 8 mg by mouth 2 (two) times daily. 03/03/23   [provider]  furosemide (LASIX) 40 MG tablet Take 40 mg by mouth daily. 09/23/22   [provider]  rosuvastatin (CRESTOR) 10 MG tablet Take 1 tablet (10 mg total) by mouth daily. 03/09/23   Maeola Harman, MD      Allergies    Amoxicillin, Losartan, Pseudoephedrine hcl, Hydralazine, Amlodipine, Hydrocodone, Hydrocodone-acetaminophen, and Nitrofurantoin    Review of Systems   Review of Systems  Respiratory:  Positive for shortness of breath.   Gen: No fevers Eyes: No vision changes HEENT: no congestion, sore throat Neck: no neck stiffness Resp: See HPI Card: no chest pain Abd: no nausea or vomiting, no abdominal pain Extremities: no leg swelling Neuro: no weakness, numbness, tingling Skin: no rashes   Physical Exam Updated Vital Signs BP (!) 207/96   Pulse 72   Temp 98 F (36.7 C) (Oral)   Resp 15   Ht 5\' 4"  (1.626 m)   Wt 78.5 kg   LMP 04/11/2009 (Within Days)   SpO2 94%   BMI 29.70 kg/m  Physical Exam Gen: Mild conversational dyspnea noted Eyes: PERRL, EOMI HEENT: no oropharyngeal swelling Neck: trachea midline Resp: Diminished at bilateral lung bases with Rales in the lower lung fields Card: RRR, no murmurs, rubs, or gallops Abd: nontender,  nondistended Extremities: no calf tenderness, trace pitting edema bilaterally Vascular: 2+ radial pulses bilaterally, 2+ DP pulses bilaterally Skin: no rashes Psyc: acting appropriately  ED Results / Procedures / Treatments   Labs (all labs ordered are listed, but only abnormal results are displayed) Labs Reviewed  BRAIN NATRIURETIC PEPTIDE - Abnormal; Notable for the following components:      Result Value   B Natriuretic Peptide 815.8 (*)    All other components within normal limits  BASIC METABOLIC PANEL - Abnormal; Notable for the following components:   Sodium 133 (*)    Glucose, Bld  121 (*)    BUN 47 (*)    Creatinine, Ser 2.49 (*)    GFR, Estimated 21 (*)    All other components within normal limits  CBC WITH DIFFERENTIAL/PLATELET - Abnormal; Notable for the following components:   RBC 3.85 (*)    Hemoglobin 10.5 (*)    HCT 31.3 (*)    All other components within normal limits  SARS CORONAVIRUS 2 BY RT PCR  TROPONIN I (HIGH SENSITIVITY)  TROPONIN I (HIGH SENSITIVITY)    EKG EKG Interpretation Date/Time:  Monday March 21 2023 06:22:13 EDT Ventricular Rate:  79 PR Interval:  171 QRS Duration:  96 QT Interval:  403 QTC Calculation: 462 R Axis:   56  Text Interpretation: Sinus rhythm Probable left atrial enlargement Nonspecific T abnormalities, lateral leads Since last tracing Non-specific ST-t changes Confirmed by Susy Frizzle [phone]) on 03/21/2023 6:23:50 AM  Radiology DG Chest Port 1 View  Result Date: 03/21/2023 CLINICAL DATA:  67 year old female with shortness of breath. EXAM: PORTABLE CHEST 1 VIEW COMPARISON:  CT Abdomen and Pelvis 06/27/2022. FINDINGS: Portable AP upright view at 0641 hours. Cardiac size appears larger than the CT last year, borderline cardiomegaly now. Other mediastinal contours are within normal limits. Diffuse increased pulmonary interstitium with basilar predominance and indistinct appearance of the vasculature. Superimposed bibasilar veiling opacity, likely small pleural effusions. No air bronchograms. No pneumothorax. Visualized tracheal air column is within normal limits. No acute osseous abnormality identified. Paucity of bowel gas in the visible abdomen. IMPRESSION: Abnormal bilateral lung opacity most suggestive of Acute pulmonary edema with layering pleural effusions. Viral/atypical respiratory infection with effusions felt less likely. Electronically Signed   By: Odessa Fleming M.D.   On: 03/21/2023 06:52    Procedures Procedures    Medications Ordered in ED Medications  labetalol (NORMODYNE) injection 10 mg (has no  administration in time range)  furosemide (LASIX) injection 40 mg (40 mg Intravenous Given 03/21/23 0730)  labetalol (NORMODYNE) injection 10 mg (10 mg Intravenous Given 03/21/23 0740)    ED Course/ Medical Decision Making/ A&P                                 Medical Decision Making 67 year old female with past medical history of hypertension secondary to renal artery stenosis and hyperlipidemia presenting to the emergency department today with worsening shortness of breath and orthopnea.  I will further evaluate the patient here with basic labs Wels and EKG and chest x-ray to evaluate for pulmonary edema as well as a BNP.  Patient denies any associated chest pain currently.  Her blood pressure is elevated here on arrival.  I will give patient labetalol here for her elevated blood pressure.  She is on beta-blockers at home and her current heart rate is in the 70s I think that she should tolerate this.  Her EKG interpreted by me shows a sinus rhythm with normal axis, normal intervals, nonspecific ST-T changes.  Also obtain a COVID-19 swab on the patient as she has had a known sick contact.  I will reevaluate but she would likely require admission as she was around 90 to 91% on room air on arrival.  The patient's chest x-ray does show pulmonary edema.  The patient is given IV Lasix here.  Her COVID test is negative.  Creatinine is elevated but does seem to be close to baseline.  Calls placed to hospitalist service for admission.  I did speak with Dr. Katrinka Blazing who [facility] patient to [facility].    Risk Prescription drug management. Decision regarding hospitalization.           Final Clinical Impression(s) / ED Diagnoses Final diagnoses:  Hypertensive urgency  Acute pulmonary edema (HCC)    Rx / DC Orders ED Discharge Orders     None         Durwin Glaze, MD 03/21/23 0800

## 2023-03-22 DIAGNOSIS — J810 Acute pulmonary edema: Secondary | ICD-10-CM | POA: Diagnosis not present

## 2023-03-22 DIAGNOSIS — I5033 Acute on chronic diastolic (congestive) heart failure: Secondary | ICD-10-CM | POA: Diagnosis not present

## 2023-03-22 DIAGNOSIS — I160 Hypertensive urgency: Secondary | ICD-10-CM | POA: Diagnosis not present

## 2023-03-22 DIAGNOSIS — I110 Hypertensive heart disease with heart failure: Secondary | ICD-10-CM | POA: Diagnosis present

## 2023-03-22 DIAGNOSIS — I10 Essential (primary) hypertension: Secondary | ICD-10-CM

## 2023-03-22 DIAGNOSIS — E7800 Pure hypercholesterolemia, unspecified: Secondary | ICD-10-CM | POA: Diagnosis not present

## 2023-03-22 DIAGNOSIS — N1832 Chronic kidney disease, stage 3b: Secondary | ICD-10-CM

## 2023-03-22 LAB — CBC
HCT: 31.8 % — ABNORMAL LOW (ref 36.0–46.0)
Hemoglobin: 10.4 g/dL — ABNORMAL LOW (ref 12.0–15.0)
MCH: 26.4 pg (ref 26.0–34.0)
MCHC: 32.7 g/dL (ref 30.0–36.0)
MCV: 80.7 fL (ref 80.0–100.0)
Platelets: 315 10*3/uL (ref 150–400)
RBC: 3.94 MIL/uL (ref 3.87–5.11)
RDW: 15.2 % (ref 11.5–15.5)
WBC: 9.2 10*3/uL (ref 4.0–10.5)
nRBC: 0 % (ref 0.0–0.2)

## 2023-03-22 LAB — BASIC METABOLIC PANEL
Anion gap: 13 (ref 5–15)
BUN: 45 mg/dL — ABNORMAL HIGH (ref 8–23)
CO2: 24 mmol/L (ref 22–32)
Calcium: 9.1 mg/dL (ref 8.9–10.3)
Chloride: 98 mmol/L (ref 98–111)
Creatinine, Ser: 2.67 mg/dL — ABNORMAL HIGH (ref 0.44–1.00)
GFR, Estimated: 19 mL/min — ABNORMAL LOW (ref >60)
Glucose, Bld: 100 mg/dL — ABNORMAL HIGH (ref 70–99)
Potassium: 3.7 mmol/L (ref 3.5–5.1)
Sodium: 135 mmol/L (ref 135–145)

## 2023-03-22 MED ORDER — CARVEDILOL 25 MG PO TABS
25.0000 mg | ORAL_TABLET | Freq: Two times a day (BID) | ORAL | Status: DC
  Administered 2023-03-22: 25 mg via ORAL
  Filled 2023-03-22: qty 1

## 2023-03-22 MED ORDER — CLONIDINE HCL 0.1 MG PO TABS: ORAL_TABLET | ORAL | Status: DC

## 2023-03-22 MED ORDER — AMLODIPINE BESYLATE 10 MG PO TABS: 10.0000 mg | ORAL_TABLET | Freq: Every day | ORAL | 0 refills | Status: DC

## 2023-03-22 MED ORDER — AMLODIPINE BESYLATE 10 MG PO TABS
10.0000 mg | ORAL_TABLET | Freq: Every day | ORAL | Status: DC
  Administered 2023-03-22: 10 mg via ORAL
  Filled 2023-03-22: qty 1

## 2023-03-22 NOTE — Hospital Course (Signed)
Mr. Scaduto was admitted to the hospital with the working diagnosis of hypertensive emergency.   67 yo female with the past medical history of hypertension, renal artery stenosis sp stenting, CKD stage 3b, and hyperlipidemia who presented with dyspnea. Apparently stop taking furosemide 3 days prior to admission due to increased urinary frequency. Over the next 2 days she developed dyspnea on exertion and orthopnea. As outpatient her blood pressure was noted to be 200 mmHg. On her initial physical examination her 02 saturation was 89% on room air, blood pressure 202/88, 189/82, HR 66, rr 19 to 22. Lungs with no wheezing or rhonchi, heart with S1 and S2 present and regular with no gallops, rubs or murmurs, abdomen with no distention and no lower extremity edema.   Na 133, K 3,5 Cl 100, bicarbonate 22, glucose 121, bun 47, cr 2,49 BNP 815  High sensitive troponin 13 and 12 Wbc 9,5 hgb 10,5 plt 288  Sars covid 19 negative   Chest radiograph with mild cardiomegaly with bilateral hilar vascular congestion, bilateral central symmetric interstitial infiltrates predominantly at the lower lobes, bilateral pleural effusions.   EKG 102 bpm, normal axis, normal intervals, qtc 462, sinus rhythm with no significant ST segment or T wave changes.   Patient was placed on nicardipine drip for blood pressure control and furosemide for diuresis.  Resumed antihypertensive medications.

## 2023-03-22 NOTE — Care Management Obs Status (Signed)
MEDICARE OBSERVATION STATUS NOTIFICATION   Patient Details  Name: OAKLI ZECH MRN: [id number] Date of Birth: Apr 08, 1956   Medicare Observation Status Notification Given:  Yes    Leone Haven, RN 03/22/2023, 2:22 PM

## 2023-03-22 NOTE — Progress Notes (Signed)
Heart Failure Navigator Progress Note  Assessed for Heart & Vascular TOC clinic readiness.  Patient does not meet criteria as this seems to be more hypertension related, her echo has a normal LV systolic function, per MD   Navigator will sign off at this time.    Taurus Alamo,RN, BSN,MSN Heart Failure Nurse Navigator. Contact by secure chat only.

## 2023-03-22 NOTE — Care Management CC44 (Signed)
Condition Code 44 Documentation Completed  Patient Details  Name: LALE LAGRASSA MRN: [id number] Date of Birth: Jul 16, 1955   Condition Code 44 given:  Yes Patient signature on Condition Code 44 notice:  Yes Documentation of 2 MD's agreement:  Yes Code 44 added to claim:  Yes    Leone Haven, RN 03/22/2023, 2:22 PM

## 2023-03-22 NOTE — TOC Transition Note (Signed)
Transition of Care [facility]) - CM/SW Discharge Note   Patient Details  Name: Karen Jimenez MRN: [id number] Date of Birth: October 12, 1955  Transition of Care [facility]) CM/SW Contact:  Leone Haven, RN Phone Number: 03/22/2023, 10:22 AM   Clinical Narrative:    Patient is for possible dc today, has no needs. Spouse will transport home.   Final next level of care: Home/Self Care Barriers to Discharge: No Barriers Identified   Patient Goals and CMS Choice   Choice offered to / list presented to : NA  Discharge Placement                         Discharge Plan and Services Additional resources added to the After Visit Summary for   In-house Referral: NA Discharge Planning Services: CM Consult Post Acute Care Choice: NA          DME Arranged: N/A DME Agency: NA       HH Arranged: NA          Social Determinants of Health (SDOH) Interventions SDOH Screenings   Food Insecurity: No Food Insecurity (03/21/2023)  Housing: Low Risk  (03/21/2023)  Transportation Needs: No Transportation Needs (03/21/2023)  Utilities: Not At Risk (03/21/2023)  Alcohol Screen: Low Risk  (11/09/2022)  Physical Activity: Inactive (11/09/2022)  Tobacco Use: Medium Risk (03/21/2023)     Readmission Risk Interventions     No data to display

## 2023-03-22 NOTE — Plan of Care (Signed)
  Problem: Education: Goal: Knowledge of General Education information will improve Description: Including pain rating scale, medication(s)/side effects and non-pharmacologic comfort measures Outcome: Progressing   Problem: Health Behavior/Discharge Planning: Goal: Ability to manage health-related needs will improve Outcome: Progressing   Problem: Clinical Measurements: Goal: Will remain free from infection Outcome: Progressing Goal: Diagnostic test results will improve Outcome: Progressing Goal: Respiratory complications will improve Outcome: Progressing Goal: Cardiovascular complication will be avoided Outcome: Progressing   Problem: Activity: Goal: Risk for activity intolerance will decrease Outcome: Progressing   Problem: Nutrition: Goal: Adequate nutrition will be maintained Outcome: Progressing   Problem: Coping: Goal: Level of anxiety will decrease Outcome: Progressing   Problem: Elimination: Goal: Will not experience complications related to bowel motility Outcome: Progressing Goal: Will not experience complications related to urinary retention Outcome: Progressing   Problem: Pain Managment: Goal: General experience of comfort will improve Outcome: Progressing   Problem: Safety: Goal: Ability to remain free from injury will improve Outcome: Progressing

## 2023-03-22 NOTE — Discharge Summary (Signed)
Physician Discharge Summary   Patient: Karen Jimenez MRN: [id number] DOB: 11/09/1955  Admit date:     03/21/2023  Discharge date: 03/22/23  Discharge Physician: York Ram Phylliss Strege   PCP: Shon Hale, MD   Recommendations at discharge:    Patient will continue furosemide 40 mg po daily. Added one extra dose of clonidine in the afternoon, instructed to take 0.1 mg in am, 0.1 in afternoon and 0,2 mg at bedtime. She has been advised about rebound hypertension effect in case of missing clonidine dosage. Resume taking amlodipine 10 mg daily.  Follow up with vascular surgery for renal artery stenosis on 09/24 Follow up with Nephrology and Cardiology as outpatient.  Follow up with Dr Melba Coon in 7 to 10 days. Follow up renal function and electrolytes in 7 days as outpatient.   Discharge Diagnoses: Principal Problem:   Acute on chronic diastolic CHF (congestive heart failure) (HCC) Active Problems:   Hypertension   CKD stage 3b, GFR 30-44 ml/min (HCC)   Pure hypercholesterolemia  Resolved Problems:   * No resolved hospital problems. [facility] Course: [name] was admitted to the hospital with the working diagnosis of hypertensive emergency.   67 yo female with the past medical history of hypertension, renal artery stenosis sp stenting, CKD stage 3b, and hyperlipidemia who presented with dyspnea. Apparently stop taking furosemide 3 days prior to admission due to increased urinary frequency. Over the next 2 days she developed dyspnea on exertion and orthopnea. As outpatient her blood pressure was noted to be 200 mmHg. On her initial physical examination her 02 saturation was 89% on room air, blood pressure 202/88, 189/82, HR 66, rr 19 to 22. Lungs with no wheezing or rhonchi, heart with S1 and S2 present and regular with no gallops, rubs or murmurs, abdomen with no distention and no lower extremity edema.   Na 133, K 3,5 Cl 100, bicarbonate 22, glucose 121, bun 47, cr  2,49 BNP 815  High sensitive troponin 13 and 12 Wbc 9,5 hgb 10,5 plt 288  Sars covid 19 negative   Chest radiograph with mild cardiomegaly with bilateral hilar vascular congestion, bilateral central symmetric interstitial infiltrates predominantly at the lower lobes, bilateral pleural effusions.   EKG 102 bpm, normal axis, normal intervals, qtc 462, sinus rhythm with no significant ST segment or T wave changes.   Patient was placed on nicardipine drip for blood pressure control and furosemide for diuresis.  Resumed antihypertensive medications.   Assessment and Plan: * Acute on chronic diastolic CHF (congestive heart failure) (HCC) Echocardiogram with preserved LV systolic function with EF 55 to 60%, mild LVH, RV systolic function preserved, no significant valvular disease. Trivial pericardial effusion, anterior to the right ventricle.   Patient was placed on IV furosemide, negative fluid balance was achieved, 1,137 ml with improvement in her symptoms.   Continue medical therapy with carvedilol.  Limited pharmacologic therapy due to low GFR and renal artery stenosis.  Continue blood pressure control with clonidine and doxazosin.  Diuresis with oral furosemide 40 mg po daily.   Acute hypoxemic respiratory failure due to acute cardiogenic pulmonary edema with bilateral pleural effusions.  She was placed on supplemental 02 per [place].  Patient tolerated well diuresis, with improvement in her oxygenation. At the time of her discharge her dyspnea had resolved and her 02 saturation was 96% on room air.   Hypertension Hypertensive emergency, end organ failure with heart failure and respiratory failure. Patient initially placed on nicardipine drip with good response.  Now has been transitioned to oral medical therapy with clonidine, doxazosin and carvedilol.   She has renal artery stenosis sp stent placement in the left 08/23, follow up US 08/24 suggesting severe stenosis on the left renal  artery. Follow up with vascular surgery on 09/24.    CKD stage 3b, GFR 30-44 ml/min (HCC) Renal function with serum cr at 2,67 with K at 3,7 and serum bicarbonate at 24. Na 135   Plan to continue diuresis with oral furosemide 40 mg po daily. Blood pressure control with clonidine, doxazosin and carvedilol.   Pure hypercholesterolemia Continue with statin therapy.          Consultants: cardiology and nephrology  Procedures performed: none   Disposition: Home Diet recommendation:  Discharge Diet Orders (From admission, onward)     Start     Ordered   03/22/23 0000  Diet - low sodium heart healthy        03/22/23 1335           Cardiac diet DISCHARGE MEDICATION: Allergies as of 03/22/2023       Reactions   Amoxicillin Anaphylaxis, Hives   Losartan Palpitations, Other (See Comments)   Severe AKI   Pseudoephedrine Hcl Palpitations   Heart rate increases   Hydralazine Nausea Only, Rash   Amlodipine Swelling   Pt currently taking in 10 mg    Hydrocodone Nausea And Vomiting   Headache   Hydrocodone-acetaminophen Nausea And Vomiting   Nitrofurantoin Other (See Comments)   Body aches        Medication List     TAKE these medications    amLODipine 10 MG tablet Commonly known as: NORVASC Take 1 tablet (10 mg total) by mouth daily.   aspirin EC 81 MG tablet Take 81 mg by mouth daily. Swallow whole.   carvedilol 25 MG tablet Commonly known as: COREG Take 25 mg by mouth 2 (two) times daily with a meal.   cloNIDine 0.2 MG tablet Commonly known as: CATAPRES Take 0.2 mg by mouth every evening. What changed: Another medication with the same name was changed. Make sure you understand how and when to take each.   cloNIDine 0.1 MG tablet Commonly known as: CATAPRES Please take 1 tablet in am and 1 tablet afternoon. What changed:  how much to take how to take this when to take this additional instructions   clopidogrel 75 MG tablet Commonly known as:  PLAVIX Take 1 tablet (75 mg total) by mouth daily.   doxazosin 8 MG tablet Commonly known as: CARDURA Take 8 mg by mouth 2 (two) times daily.   furosemide 40 MG tablet Commonly known as: LASIX Take 40 mg by mouth daily.   rosuvastatin 10 MG tablet Commonly known as: Crestor Take 1 tablet (10 mg total) by mouth daily.        Follow-up Information     Shon Hale, MD Follow up on 03/30/2023.   Specialty: Family Medicine Why: @11 :30am Contact information: [address] [place] [address] [phone]                Discharge Exam: [facility] Weights   03/21/23 0620 03/22/23 0411  Weight: 78.5 kg 77.5 kg   BP (!) 179/76 (BP Location: Left Arm)   Pulse 79   Temp 98.2 F (36.8 C) (Oral)   Resp 18   Ht 5\' 4"  (1.626 m)   Wt 77.5 kg   LMP 04/11/2009 (Within Days)   SpO2 95%   BMI 29.33  kg/m   Patient with no chest pain, no dyspnea, no PND or orthopnea. No lower extremity edema  Neurology awake and alert ENT with no pallor Cardiovascular with S1 and S2 present and regular with no gallops, rubs or murmurs No JVD No lower extremity edema Respiratory with no rales or wheezing, no rhonchi Abdomen with no distention   Condition at discharge: stable  The results of significant diagnostics from this hospitalization (including imaging, microbiology, ancillary and laboratory) are listed below for reference.   Imaging Studies: DG Chest Port 1 View  Result Date: 03/21/2023 CLINICAL DATA:  67 year old female with shortness of breath. EXAM: PORTABLE CHEST 1 VIEW COMPARISON:  CT Abdomen and Pelvis 06/27/2022. FINDINGS: Portable AP upright view at 0641 hours. Cardiac size appears larger than the CT last year, borderline cardiomegaly now. Other mediastinal contours are within normal limits. Diffuse increased pulmonary interstitium with basilar predominance and indistinct appearance of the vasculature. Superimposed bibasilar veiling opacity, likely small  pleural effusions. No air bronchograms. No pneumothorax. Visualized tracheal air column is within normal limits. No acute osseous abnormality identified. Paucity of bowel gas in the visible abdomen. IMPRESSION: Abnormal bilateral lung opacity most suggestive of Acute pulmonary edema with layering pleural effusions. Viral/atypical respiratory infection with effusions felt less likely. Electronically Signed   By: Odessa Fleming M.D.   On: 03/21/2023 06:52   VAS US RENAL ARTERY DUPLEX  Result Date: 03/09/2023 ABDOMINAL VISCERAL Patient Name:  MILES GADE  Date of Exam:   03/09/2023 Medical Rec #: [id number]           Accession #:    [id number] Date of Birth: [birth year]            Patient Gender: F Patient Age:   38 years Exam Location:  [facility] Procedure:      VAS US RENAL ARTERY DUPLEX Referring Phys: [id number] Dennard Schaumann CAIN -------------------------------------------------------------------------------- Indications: Left renal artery stent evaluation High Risk Factors: Hypertension, hyperlipidemia, past history of smoking. Vascular Interventions: Left renal artery stent placed 02/15/2022. Limitations: Air/bowel gas, obesity and breathing artifact. Comparison Study: No significant change since prior exam. Performing Technologist: Elita Quick RVT Supporting Technologist: Gertie Fey RDMS, RVT, RDCS  Examination Guidelines: A complete evaluation includes B-mode imaging, spectral Doppler, color Doppler, and power Doppler as needed of all accessible portions of each vessel. Bilateral testing is considered an integral part of a complete examination. Limited examinations for reoccurring indications may be performed as noted.  Duplex Findings: +-----------------+--------+--------+--------+ Left Renal ArteryPSV cm/sEDV cm/sComment  +-----------------+--------+--------+--------+ Proximal            77      15            +-----------------+--------+--------+--------+ Mid                 748      72   tortuous +-----------------+--------+--------+--------+ Distal              66      17            +-----------------+--------+--------+--------+ +------------+--------+--------+--+-----------+--------+--------+---+ Right KidneyPSV cm/sEDV cm/sRILeft KidneyPSV cm/sEDV cm/sRI  +------------+--------+--------+--+-----------+--------+--------+---+ Upper Pole                    Upper Pole                     +------------+--------+--------+--+-----------+--------+--------+---+ Mid  Mid                            +------------+--------+--------+--+-----------+--------+--------+---+ Lower Pole                    Lower Pole                     +------------+--------+--------+--+-----------+--------+--------+---+ Hilar                         Hilar                          +------------+--------+--------+--+-----------+--------+--------+---+ +------------------++------------------+-------+ Right Kidney      Left Kidney               +------------------++------------------+-------+ RAR               RAR                       +------------------++------------------+-------+ RAR (manual)      RAR (manual)              +------------------++------------------+-------+ Cortex            Cortex                    +------------------++------------------+-------+ Cortex thickness  Corex thickness   1.04 mm +------------------++------------------+-------+ Kidney length (cm)Kidney length (cm)9.00    +------------------++------------------+-------+  Summary: Renal:  Left: Evidence of a > 60% stenosis in the left renal artery, unable       to visualize stent. Normal size of left kidney.        NOTE: This was a technically difficult and limited exam,       further imaging modality may be warranted.  *See table(s) above for measurements and observations.  Diagnosing physician: Lemar Livings MD  Electronically signed  by Lemar Livings MD on 03/09/2023 at 1:35:47 PM.    Final     Microbiology: Results for orders placed or performed during the hospital encounter of 03/21/23  SARS Coronavirus 2 by RT PCR (hospital order, performed in [facility] hospital lab) *cepheid single result test* Anterior Nasal Swab     Status: None   Collection Time: 03/21/23  6:33 AM   Specimen: Anterior Nasal Swab  Result Value Ref Range Status   SARS Coronavirus 2 by RT PCR NEGATIVE NEGATIVE Final    Comment: (NOTE) SARS-CoV-2 target nucleic acids are NOT DETECTED.  The SARS-CoV-2 RNA is generally detectable in upper and lower respiratory specimens during the acute phase of infection. The lowest concentration of SARS-CoV-2 viral copies this assay can detect is 250 copies / mL. A negative result does not preclude SARS-CoV-2 infection and should not be used as the sole basis for treatment or other patient management decisions.  A negative result may occur with improper specimen collection / handling, submission of specimen other than nasopharyngeal swab, presence of viral mutation(s) within the areas targeted by this assay, and inadequate number of viral copies (<250 copies / mL). A negative result must be combined with clinical observations, patient history, and epidemiological information.  Fact Sheet for Patients:   RoadLapTop.co.za  Fact Sheet for Healthcare Providers: http://kim-miller.com/  This test is not yet approved or  cleared by the [place] FDA and has been authorized for detection and/or diagnosis of SARS-CoV-2 by FDA under an Emergency  Use Authorization (EUA).  This EUA will remain in effect (meaning this test can be used) for the duration of the COVID-19 declaration under Section 564(b)(1) of the Act, 21 U.S.C. section 360bbb-3(b)(1), unless the authorization is terminated or revoked sooner.  Performed at [facility], [address]  [place], [place] [address]     Labs: CBC: Recent Labs  Lab 03/21/23 6045 03/22/23 0239  WBC 9.5 9.2  NEUTROABS 7.3  --   HGB 10.5* 10.4*  HCT 31.3* 31.8*  MCV 81.3 80.7  PLT 288 315   Basic Metabolic Panel: Recent Labs  Lab 03/21/23 0633 03/22/23 0239  NA 133* 135  K 3.5 3.7  CL 100 98  CO2 22 24  GLUCOSE 121* 100*  BUN 47* 45*  CREATININE 2.49* 2.67*  CALCIUM 8.9 9.1   Liver Function Tests: No results for input(s): "AST", "ALT", "ALKPHOS", "BILITOT", "PROT", "ALBUMIN" in the last 168 hours. CBG: No results for input(s): "GLUCAP" in the last 168 hours.  Discharge time spent: greater than 30 minutes.  Signed: Coralie Keens, MD [facility] 03/22/2023

## 2023-03-22 NOTE — Assessment & Plan Note (Addendum)
Echocardiogram with preserved LV systolic function with EF 55 to 60%, mild LVH, RV systolic function preserved, no significant valvular disease. Trivial pericardial effusion, anterior to the right ventricle.   Patient was placed on IV furosemide, negative fluid balance was achieved, 1,137 ml with improvement in her symptoms.   Continue medical therapy with carvedilol.  Limited pharmacologic therapy due to low GFR and renal artery stenosis.  Continue blood pressure control with clonidine and doxazosin.  Diuresis with oral furosemide 40 mg po daily.   Acute hypoxemic respiratory failure due to acute cardiogenic pulmonary edema with bilateral pleural effusions.  She was placed on supplemental 02 per [place].  Patient tolerated well diuresis, with improvement in her oxygenation. At the time of her discharge her dyspnea had resolved and her 02 saturation was 96% on room air.

## 2023-03-22 NOTE — Assessment & Plan Note (Addendum)
Renal function with serum cr at 2,67 with K at 3,7 and serum bicarbonate at 24. Na 135   Plan to continue diuresis with oral furosemide 40 mg po daily. Blood pressure control with clonidine, doxazosin and carvedilol.

## 2023-03-22 NOTE — Assessment & Plan Note (Signed)
Continue with statin therapy.  ?

## 2023-03-22 NOTE — TOC Initial Note (Signed)
Transition of Care [facility]) - Initial/Assessment Note    Patient Details  Name: Karen Jimenez MRN: [id number] Date of Birth: 24-Aug-1955  [facility]) CM/SW Contact:    Leone Haven, RN Phone Number: 03/22/2023, 10:21 AM  Clinical Narrative:                 From home with spouse, has PCP, Dr. Chanetta Marshall, and insurance on file, states has no HH services in place at this time, has walker and cane at home.  States spouse will transport her home at [employer] and family is support system, states gets medications from [facility] in [place].  Pta self ambulatory   Expected Discharge Plan: Home/Self Care Barriers to Discharge: No Barriers Identified   Patient Goals and CMS Choice Patient states their goals for this hospitalization and ongoing recovery are:: return home with spouse   Choice offered to / list presented to : NA      Expected Discharge Plan and Services In-house Referral: NA Discharge Planning Services: CM Consult Post Acute Care Choice: NA Living arrangements for the past 2 months: Single Family Home                 DME Arranged: N/A DME Agency: NA       HH Arranged: NA          Prior Living Arrangements/Services Living arrangements for the past 2 months: Single Family Home Lives with:: Spouse Patient language and need for interpreter reviewed:: Yes Do you feel safe going back to the place where you live?: Yes      Need for Family Participation in Patient Care: Yes (Comment) Care giver support system in place?: Yes (comment)   Criminal Activity/Legal Involvement Pertinent to Current Situation/Hospitalization: No - Comment as needed  Activities of Daily Living Home Assistive Devices/Equipment: Crutches, Cane (specify quad or straight), Walker (specify type), Bedside commode/3-in-1, Blood pressure cuff, Brace (specify type), Sling (specify type), Splint (specify type), C-collar, Dentures (specify type), Grab bars in shower, Grab bars around  toilet ADL Screening (condition at time of admission) Patient's cognitive ability adequate to safely complete daily activities?: Yes Is the patient deaf or have difficulty hearing?: No Does the patient have difficulty seeing, even when wearing glasses/contacts?: No Does the patient have difficulty concentrating, remembering, or making decisions?: No Patient able to express need for assistance with ADLs?: Yes Does the patient have difficulty dressing or bathing?: No Independently performs ADLs?: Yes (appropriate for developmental age) Does the patient have difficulty walking or climbing stairs?: No Weakness of Legs: None Weakness of Arms/Hands: None  Permission Sought/Granted Permission sought to share information with : Case Manager Permission granted to share information with : Yes, Verbal Permission Granted              Emotional Assessment Appearance:: Appears stated age Attitude/Demeanor/Rapport: Engaged Affect (typically observed): Appropriate Orientation: : Oriented to Self, Oriented to Place, Oriented to  Time, Oriented to Situation Alcohol / Substance Use: Not Applicable Psych Involvement: No (comment)  Admission diagnosis:  Acute pulmonary edema (HCC) [J81.0] Hypertensive urgency [I16.0] Diastolic congestive heart failure (HCC) [I50.30] Hypertensive CHF (congestive heart failure) (HCC) [I11.0] Patient Active Problem List   Diagnosis Date Noted   Diastolic congestive heart failure (HCC) 03/21/2023   Hypertensive CHF (congestive heart failure) (HCC) 03/21/2023   Acute pulmonary edema (HCC) 03/21/2023   Acute diverticulitis 06/27/2022   Hyperkalemia 06/27/2022   Hypertension 06/27/2022   AKI (acute kidney injury) (HCC) 06/27/2022   CAD (coronary artery  disease) 06/27/2022   Low back pain 04/18/2020   Weight gain 02/25/2020   Benign hypertension with chronic kidney disease, stage III (HCC) 02/25/2020   Hyponatremia 02/13/2020   Left renal artery stenosis (HCC)  02/13/2020   Persistent proteinuria 02/13/2020   Hypertensive urgency 02/04/2020   Anxiety 12/29/2017   NSTEMI (non-ST elevated myocardial infarction) (HCC) 01/21/2017   Prediabetes 01/21/2017   Pure hypercholesterolemia 01/21/2017   SVT (supraventricular tachycardia) 01/21/2017   Vitamin D deficiency disease 01/21/2017   Paronychia of finger 01/21/2017   Knee joint replacement by other means 09/24/2013   PCP:  Shon Hale, MD Pharmacy:   [facility] [phone] - [place], [place] - [address] AT [facility] [address] [place] [address] Phone: [phone] Fax: [phone]  [facility] [address], [place] - [address] [place] [address] Phone: [phone] Fax: [phone]     Social Determinants of Health (SDOH) Social History: SDOH Screenings   Food Insecurity: No Food Insecurity (03/21/2023)  Housing: Low Risk  (03/21/2023)  Transportation Needs: No Transportation Needs (03/21/2023)  Utilities: Not At Risk (03/21/2023)  Alcohol Screen: Low Risk  (11/09/2022)  Physical Activity: Inactive (11/09/2022)  Tobacco Use: Medium Risk (03/21/2023)   SDOH Interventions:     Readmission Risk Interventions     No data to display

## 2023-03-22 NOTE — Progress Notes (Addendum)
Patient Name: Karen Jimenez Date of Encounter: 03/22/2023 [facility] Cardiologist: Chilton Si, MD   Interval Summary  .    Patient feels very well this AM and wants to go home. Agreeable to taking amlodipine and lasix until her renal artery stenting in a few weeks. Seen by nephrology this AM   Vital Signs .    Vitals:   03/22/23 0410 03/22/23 0411 03/22/23 0616 03/22/23 0920  BP: (!) 182/90  (!) 181/76 (!) 172/69  Pulse:    79  Resp: 18   16  Temp: 98 F (36.7 C)   98 F (36.7 C)  TempSrc: Oral   Oral  SpO2: 97%   94%  Weight:  77.5 kg    Height:        Intake/Output Summary (Last 24 hours) at 03/22/2023 0933 Last data filed at 03/22/2023 0700 Gross per 24 hour  Intake 243 ml  Output 1500 ml  Net -1257 ml      03/22/2023    4:11 AM 03/21/2023    6:20 AM 03/09/2023   10:59 AM  Last 3 Weights  Weight (lbs) 170 lb 13.7 oz 173 lb 176 lb 12.8 oz  Weight (kg) 77.5 kg 78.472 kg 80.196 kg      Telemetry/ECG    NSR  - Personally Reviewed  Physical Exam .   GEN: No acute distress.  Sitting upright in the bed Neck: No JVD Cardiac: RRR, no murmurs, rubs, or gallops. Radial pulses 2+ bilaterally  Respiratory: Clear to auscultation bilaterally. Normal work of breathing on room air  GI: Soft, nontender, non-distended  MS: No edema in BLE   Assessment & Plan .     Acute on Chronic Diastolic Heart Failure  Ankle Edema  - Echocardiogram from 12/2022 showed EF 55-60%, no wall motion abnormalities, mild LVH, grade I DD, normal RV function  - Now patient presented to ED complaining of shortness of breath, orthopnea, cough. This occurred after she stopped her lasix for a few days due to urinary incontinence  - BNP elevated to 815. CXR suggestive of acute pulmonary edema with layering pleural effusions  - Patient has received 3 doses of IV lasix 40 mg - output 1.5 L urine. Creatinine fairly stable this AM. Euvolemic on exam  - Patient often gets ankle edema  with amlodipine. Discussed use of compression stockings and elevating feet whenever possible. Patient is in agreement  - Plan to discharge patient on PO lasix 40 mg daily - patient agreeable and understands the importance of lasix    HTN Urgency  - Patient is followed by Dr. Duke Salvia with the HTN clinic  - PTA regiment carvedilol 25 mg BID, clonidine 0.1 mg every AM and 0.2 mg every PM, doxazosin 8 mg BID. Also was taking amlodipine as needed for HTN emergency (had not been taking daily due to ankle edema) - In the past, patient had hyperkalemia with spironolactone. Medication options also limited by renal function (baseline creatinine 2.5-2.8)  - Now on amlodipine 10 mg daily, carvedilol 25 mg BID, clonidine 0.1 mg every AM and 0.2 mg every PM, doxazosin 8 mg BID. SBP in the 170s this AM  - Patient willing to add an additional dose of clonidine 0.1 mg every afternoon  - Nephrology recommends BP goal Less than 140    Renal Artery stenosis  - Followed by Dr. Randie Heinz with Vascular Surgery- previously had stent placed to left renal artery in 02/2022.  - Renal artery ultrasound in  02/2023 suggested severe stenosis in the left renal artery  - Continue plavix, statin  - Patient is scheduled for renal angiography on 9/23. Vascular surgery unable to do renal artery imaging/stenting this admission.    CKD stage IIIb  - eGFRs in the high teens-low twenties - Avoid nephrotoxic medications  - Nephrology following   For questions or updates, please contact [facility] Please consult [email or web address] for contact info under        Signed, Jonita Albee, PA-C   Patient seen, examined. Available data reviewed. Agree with findings, assessment, and plan as outlined by Marijean Niemann, PA-C.  The patient is independently interviewed and examined.  On my exam, she is alert, oriented, in no distress.  HEENT is normal, JVP is normal, lungs are clear bilaterally, heart is regular rate and rhythm with no  murmur gallop, abdomen is soft and nontender, extremities have no edema, skin is warm and dry with no rash.  Her most recent blood pressure is 172/69.  This is a marked improvement from previous.  She will continue on her current medical regimen which is outlined above.  She appears to be tolerating the addition of daily furosemide and amlodipine.  Nephrology notes reviewed along with their recommendations.  Plan for renal artery angiography with CO2 and intervention scheduled in about 2 weeks.  Otherwise continue current medical program.  Patient appears medically stable for hospital discharge.  She understands the importance of remaining on daily loop diuretic therapy to prevent diastolic heart failure in the context of her hypertensive heart disease.  Tonny Bollman, M.D. 03/22/2023 11:15 AM

## 2023-03-22 NOTE — Assessment & Plan Note (Addendum)
Hypertensive emergency, end organ failure with heart failure and respiratory failure. Patient initially placed on nicardipine drip with good response.  Now has been transitioned to oral medical therapy with clonidine, doxazosin and carvedilol.   She has renal artery stenosis sp stent placement in the left 08/23, follow up US 08/24 suggesting severe stenosis on the left renal artery. Follow up with vascular surgery on 09/24.

## 2023-03-22 NOTE — Progress Notes (Signed)
[facility] Progress Note   Subjective:   "I feel fantastic"  Breathing improved, no neurologic symptoms or HA, no orthopnea, dyspnea.  UOP yesterday - incomplete collection.  No new LUTs.  Objective Vitals:   03/21/23 2121 03/22/23 0410 03/22/23 0411 03/22/23 0616  BP: (!) 171/73 (!) 182/90  (!) 181/76  Pulse:      Resp:  18    Temp:  98 F (36.7 C)    TempSrc:  Oral    SpO2:  97%    Weight:   77.5 kg   Height:       Physical Exam General: appears well Heart: RRR Lungs: clear now Abdomen: soft Extremities: no edema   Additional Objective Labs: Basic Metabolic Panel: Recent Labs  Lab 03/21/23 0633 03/22/23 0239  NA 133* 135  K 3.5 3.7  CL 100 98  CO2 22 24  GLUCOSE 121* 100*  BUN 47* 45*  CREATININE 2.49* 2.67*  CALCIUM 8.9 9.1   Liver Function Tests: No results for input(s): "AST", "ALT", "ALKPHOS", "BILITOT", "PROT", "ALBUMIN" in the last 168 hours. No results for input(s): "LIPASE", "AMYLASE" in the last 168 hours. CBC: Recent Labs  Lab 03/21/23 0633 03/22/23 0239  WBC 9.5 9.2  NEUTROABS 7.3  --   HGB 10.5* 10.4*  HCT 31.3* 31.8*  MCV 81.3 80.7  PLT 288 315   Blood Culture    Component Value Date/Time   SDES URINE, CLEAN CATCH 06/29/2014 1846   SPECREQUEST NONE 06/29/2014 1846   CULT NO GROWTH Performed at [facility]  06/29/2014 1846   REPTSTATUS 07/01/2014 FINAL 06/29/2014 1846    Cardiac Enzymes: No results for input(s): "CKTOTAL", "CKMB", "CKMBINDEX", "TROPONINI" in the last 168 hours. CBG: No results for input(s): "GLUCAP" in the last 168 hours. Iron Studies: No results for input(s): "IRON", "TIBC", "TRANSFERRIN", "FERRITIN" in the last 72 hours. @lablastinr3 @ Studies/Results: DG Chest Port 1 View  Result Date: 03/21/2023 CLINICAL DATA:  67 year old female with shortness of breath. EXAM: PORTABLE CHEST 1 VIEW COMPARISON:  CT Abdomen and Pelvis 06/27/2022. FINDINGS: Portable AP upright view at 0641  hours. Cardiac size appears larger than the CT last year, borderline cardiomegaly now. Other mediastinal contours are within normal limits. Diffuse increased pulmonary interstitium with basilar predominance and indistinct appearance of the vasculature. Superimposed bibasilar veiling opacity, likely small pleural effusions. No air bronchograms. No pneumothorax. Visualized tracheal air column is within normal limits. No acute osseous abnormality identified. Paucity of bowel gas in the visible abdomen. IMPRESSION: Abnormal bilateral lung opacity most suggestive of Acute pulmonary edema with layering pleural effusions. Viral/atypical respiratory infection with effusions felt less likely. Electronically Signed   By: Odessa Fleming M.D.   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  She  presented 03/21/23 with BP 210s and pulm congestion after stopping lasix a few days prior due to urinary incont which was inconvient (had been present for months or longer).  She rec'd IV lasix and amlodipine 10 and BP has improved considerably to 170s this AM.  Vascular unable to do renal artery imaging/stenting today and I think it's very reasonable for her to discharge and return 9/23.  She requests this course.   Cont statin and plavix.   She is also agreeable to a mid day clonidine 0.1mg  in the meantime.  Please add to d/c meds, resume lasix 40 daily and  amlodipine 10 daily in addition to rest of meds.   **CKD 3b:  progressive kidney disease which has accelerated a bit recently with eGFRs in the high teens to low 20s.  Suspect this is multifactorial with contributions of uncontrolled HTN and worsening RAS.  Avoid marked drops in BP - would target now lower than 140 at this point.  Avoid nephrotoxins as able - if iodinated contrast unavoidable for RAS tx would use smallest amount possible.  Already has a tendency to volume overload - no role for pre contrast hydration.    **LUTS: urinary frequency and incont led to self d/c diuretics recently. Discussed she will need to continue at least until RAS addressed.  She is agreeable and will go home on lasix 40 daily.   **Hyponatremia:  mild, appearing hypervolemic and improved with diuresis.   **Anemia: mild, MCV 81.     **HL: on statin.    Ok for d/c today as above.  Please call with concerns.   Estill Bakes MD 03/22/2023, 7:51 AM  [facility] Pager: [phone]

## 2023-03-24 LAB — HIV-1/HIV-2 QUALITATIVE RNA
Final Interpretation: NEGATIVE
HIV-1 RNA, Qualitative: NONREACTIVE
HIV-2 RNA, Qualitative: NONREACTIVE

## 2023-03-24 LAB — HIV-1/2 AB - DIFFERENTIATION
HIV 1 Ab: NONREACTIVE
HIV 2 Ab: NONREACTIVE
Note: NEGATIVE

## 2023-03-24 LAB — HIV ANTIBODY (ROUTINE TESTING W REFLEX): HIV Screen 4th Generation wRfx: REACTIVE — AB

## 2023-04-04 ENCOUNTER — Other Ambulatory Visit: Payer: Self-pay

## 2023-04-04 ENCOUNTER — Ambulatory Visit (HOSPITAL_COMMUNITY)
Admission: RE | Admit: 2023-04-04 | Discharge: 2023-04-04 | Disposition: A | Discharge status: Home / Self Care | Payer: Medicare Other | Attending: Vascular Surgery | Admitting: Vascular Surgery

## 2023-04-04 ENCOUNTER — Encounter (HOSPITAL_COMMUNITY)
Admission: RE | Disposition: A | Discharge status: Home / Self Care | Payer: Self-pay | Source: Home / Self Care | Attending: Vascular Surgery

## 2023-04-04 DIAGNOSIS — T82858A Stenosis of vascular prosthetic devices, implants and grafts, initial encounter: Secondary | ICD-10-CM

## 2023-04-04 DIAGNOSIS — I129 Hypertensive chronic kidney disease with stage 1 through stage 4 chronic kidney disease, or unspecified chronic kidney disease: Secondary | ICD-10-CM | POA: Diagnosis not present

## 2023-04-04 DIAGNOSIS — Z87891 Personal history of nicotine dependence: Secondary | ICD-10-CM | POA: Diagnosis not present

## 2023-04-04 DIAGNOSIS — T82856A Stenosis of peripheral vascular stent, initial encounter: Secondary | ICD-10-CM | POA: Diagnosis not present

## 2023-04-04 DIAGNOSIS — I701 Atherosclerosis of renal artery: Secondary | ICD-10-CM | POA: Diagnosis present

## 2023-04-04 DIAGNOSIS — N183 Chronic kidney disease, stage 3 unspecified: Secondary | ICD-10-CM | POA: Diagnosis not present

## 2023-04-04 DIAGNOSIS — Y832 Surgical operation with anastomosis, bypass or graft as the cause of abnormal reaction of the patient, or of later complication, without mention of misadventure at the time of the procedure: Secondary | ICD-10-CM | POA: Diagnosis not present

## 2023-04-04 HISTORY — PX: RENAL ANGIOGRAPHY: CATH118260

## 2023-04-04 LAB — POCT I-STAT, CHEM 8
BUN: 50 mg/dL — ABNORMAL HIGH (ref 8–23)
Calcium, Ion: 1.17 mmol/L (ref 1.15–1.40)
Chloride: 99 mmol/L (ref 98–111)
Creatinine, Ser: 3.6 mg/dL — ABNORMAL HIGH (ref 0.44–1.00)
Glucose, Bld: 112 mg/dL — ABNORMAL HIGH (ref 70–99)
HCT: 27 % — ABNORMAL LOW (ref 36.0–46.0)
Hemoglobin: 9.2 g/dL — ABNORMAL LOW (ref 12.0–15.0)
Potassium: 3.2 mmol/L — ABNORMAL LOW (ref 3.5–5.1)
Sodium: 135 mmol/L (ref 135–145)
TCO2: 22 mmol/L (ref 22–32)

## 2023-04-04 LAB — GLUCOSE, CAPILLARY
Glucose-Capillary: 116 mg/dL — ABNORMAL HIGH (ref 70–99)
Glucose-Capillary: 115 mg/dL — ABNORMAL HIGH (ref 70–99)

## 2023-04-04 SURGERY — RENAL ANGIOGRAPHY
Anesthesia: LOCAL

## 2023-04-04 MED ORDER — HEPARIN SODIUM (PORCINE) 1000 UNIT/ML IJ SOLN
INTRAMUSCULAR | Status: DC | PRN
  Administered 2023-04-04: 5000 [IU] via INTRAVENOUS

## 2023-04-04 MED ORDER — CLOPIDOGREL BISULFATE 300 MG PO TABS
ORAL_TABLET | ORAL | Status: DC | PRN
  Administered 2023-04-04: 300 mg via ORAL

## 2023-04-04 MED ORDER — MIDAZOLAM HCL 2 MG/2ML IJ SOLN
INTRAMUSCULAR | Status: DC | PRN
  Administered 2023-04-04: 1 mg via INTRAVENOUS

## 2023-04-04 MED ORDER — LABETALOL HCL 5 MG/ML IV SOLN: 10.0000 mg | INTRAVENOUS | Status: DC | PRN

## 2023-04-04 MED ORDER — FENTANYL CITRATE (PF) 100 MCG/2ML IJ SOLN
INTRAMUSCULAR | Status: AC
  Filled 2023-04-04: qty 2

## 2023-04-04 MED ORDER — SODIUM CHLORIDE 0.9% FLUSH: 3.0000 mL | Freq: Two times a day (BID) | INTRAVENOUS | Status: DC

## 2023-04-04 MED ORDER — HEPARIN (PORCINE) IN NACL 1000-0.9 UT/500ML-% IV SOLN
INTRAVENOUS | Status: DC | PRN
  Administered 2023-04-04 (×2): 500 mL

## 2023-04-04 MED ORDER — ONDANSETRON HCL 4 MG/2ML IJ SOLN: 4.0000 mg | Freq: Four times a day (QID) | INTRAMUSCULAR | Status: DC | PRN

## 2023-04-04 MED ORDER — SODIUM CHLORIDE 0.9 % IV SOLN: INTRAVENOUS | Status: DC

## 2023-04-04 MED ORDER — HYDRALAZINE HCL 20 MG/ML IJ SOLN
INTRAMUSCULAR | Status: AC
  Filled 2023-04-04: qty 1

## 2023-04-04 MED ORDER — MIDAZOLAM HCL 2 MG/2ML IJ SOLN
INTRAMUSCULAR | Status: AC
  Filled 2023-04-04: qty 2

## 2023-04-04 MED ORDER — SODIUM CHLORIDE 0.9% FLUSH: 3.0000 mL | INTRAVENOUS | Status: DC | PRN

## 2023-04-04 MED ORDER — LIDOCAINE HCL (PF) 1 % IJ SOLN
INTRAMUSCULAR | Status: DC | PRN
  Administered 2023-04-04: 12 mL

## 2023-04-04 MED ORDER — OXYCODONE HCL 5 MG PO TABS: 5.0000 mg | ORAL_TABLET | ORAL | Status: DC | PRN

## 2023-04-04 MED ORDER — SODIUM CHLORIDE 0.9 % IV SOLN: 250.0000 mL | INTRAVENOUS | Status: DC | PRN

## 2023-04-04 MED ORDER — IODIXANOL 320 MG/ML IV SOLN
INTRAVENOUS | Status: DC | PRN
  Administered 2023-04-04: 20 mL

## 2023-04-04 MED ORDER — CLOPIDOGREL BISULFATE 75 MG PO TABS: 300.0000 mg | ORAL_TABLET | Freq: Once | ORAL | Status: DC

## 2023-04-04 MED ORDER — CLOPIDOGREL BISULFATE 300 MG PO TABS
ORAL_TABLET | ORAL | Status: AC
  Filled 2023-04-04: qty 1

## 2023-04-04 MED ORDER — CLOPIDOGREL BISULFATE 75 MG PO TABS: 75.0000 mg | ORAL_TABLET | Freq: Every day | ORAL | Status: DC

## 2023-04-04 MED ORDER — FENTANYL CITRATE (PF) 100 MCG/2ML IJ SOLN
INTRAMUSCULAR | Status: DC | PRN
  Administered 2023-04-04: 50 ug via INTRAVENOUS

## 2023-04-04 MED ORDER — SODIUM CHLORIDE 0.9 % WEIGHT BASED INFUSION: 1.0000 mL/kg/h | INTRAVENOUS | Status: DC

## 2023-04-04 MED ORDER — HYDRALAZINE HCL 20 MG/ML IJ SOLN
INTRAMUSCULAR | Status: DC | PRN
  Administered 2023-04-04: 10 mg via INTRAVENOUS

## 2023-04-04 MED ORDER — MORPHINE SULFATE (PF) 2 MG/ML IV SOLN: 2.0000 mg | INTRAVENOUS | Status: DC | PRN

## 2023-04-04 SURGICAL SUPPLY — 23 items
BALLN VIATRAC 5X20X135 (BALLOONS) ×1
BALLOON VIATRAC 5X20X135 (BALLOONS) IMPLANT
CATH ANGIO 5F PIGTAIL 65CM (CATHETERS) IMPLANT
CLOSURE MYNX CONTROL 6F/7F (Vascular Products) IMPLANT
COVER DOME SNAP 22 D (MISCELLANEOUS) IMPLANT
DCB RANGER 5.0X40 135 (BALLOONS) IMPLANT
GUIDE CATH VISTA IMA 6F (CATHETERS) IMPLANT
GUIDEWIRE ANGLED .035X150CM (WIRE) IMPLANT
KIT ANGIASSIST CO2 SYSTEM (KITS) IMPLANT
KIT ENCORE 26 ADVANTAGE (KITS) IMPLANT
KIT ESSENTIALS PG (KITS) IMPLANT
KIT MICROPUNCTURE NIT STIFF (SHEATH) IMPLANT
KIT PV (KITS) ×1 IMPLANT
KIT SINGLE USE MANIFOLD (KITS) IMPLANT
KIT SYRINGE INJ CVI SPIKEX1 (MISCELLANEOUS) IMPLANT
RANGER DCB 5.0X40 135 (BALLOONS) ×1
SHEATH PINNACLE 6F 10CM (SHEATH) IMPLANT
SHEATH PROBE COVER 6X72 (BAG) IMPLANT
TRANSDUCER W/STOPCOCK (MISCELLANEOUS) ×1 IMPLANT
TRAY PV CATH (CUSTOM PROCEDURE TRAY) ×1 IMPLANT
WIRE BENTSON .035X145CM (WIRE) IMPLANT
WIRE SPARTACORE .014X190CM (WIRE) IMPLANT
WIRE STABILIZER XS .014X180CM (WIRE) IMPLANT

## 2023-04-04 NOTE — Interval H&P Note (Signed)
History and Physical Interval Note:  04/04/2023 7:20 AM  Karen Jimenez  has presented today for surgery, with the diagnosis of renal artery stenosis.  The various methods of treatment have been discussed with the patient and family. After consideration of risks, benefits and other options for treatment, the patient has consented to  Procedure(s): RENAL ANGIOGRAPHY (N/A) as a surgical intervention.  The patient's history has been reviewed, patient examined, no change in status, stable for surgery.  I have reviewed the patient's chart and labs.  Questions were answered to the patient's satisfaction.     Lemar Livings

## 2023-04-04 NOTE — Op Note (Addendum)
Patient name: RISSIE PERRIN MRN: [id number] DOB: 02-Jun-1956 Sex: female  04/04/2023 Pre-operative Diagnosis: Uncontrolled hypertension, left renal artery in-stent restenosis Post-operative diagnosis:  Same Surgeon:  Luanna Salk. Randie Heinz, MD Procedure Performed: 1.  Percutaneous access and Mynx device closure right common femoral artery 2.  Catheter in aorta and aortogram 3.  Drug-coated balloon angioplasty left renal artery in-stent restenosis with 5 x 40 mm Ranger 4.  Moderate sedation with fentanyl and Versed for 43 minutes  Indications: 67 year old female underwent left renal artery stenting for uncontrolled hypertension.  She now has evidence of in-stent restenosis with recurrent medication refractory hypertension and recent admission for fluid overload.  She does have elevated creatinine at 3.5 is not indicated for aortogram with CO2 and contrast and possible intervention of the left renal artery in-stent restenosis.  Findings: Aorta and the celiac and SMA were all patent as well as the right renal artery although the right kidney did not fill with contrast.  On the left side the left renal artery stent was subtotally occluded at least 95% stenosed initially did not fill the left kidney and after initial plain balloon angioplasty of the in-stent restenosis appeared to be resolved down to 0% and this was then drug-coated balloon angioplasty and completion there was 0% residual stenosis and brisk filling of the left kidney.  Plan will be for indefinite dual antiplatelet therapy   Procedure:  The patient was identified in the holding area and taken to room 8.  The patient was then placed supine on the table and prepped and draped in the usual sterile fashion.  A time out was called.  Ultrasound was used to evaluate the right common femoral artery which was patent and compressible and there was no obvious disease.  The area was anesthetized with 1% lidocaine and cannulated with a micropuncture  needle followed by wire and a sheath.  An ultrasound image was saved the permanent record.  We then placed a Bentson wire followed by 6 [place] sheath and concomitantly we administered fentanyl and Versed as moderate sedation her vital signs were monitored throughout the case.  Patient did require hydralazine due to hypotension during the case as well with a systolic pressure of 180 mmHg which did control down to 150 and ultimately 140 after intervention.  We placed a Bentson wire followed by a pigtail catheter to the level of T12 and performed CO2 aortogram.  I then used an IM guide catheter to select the left renal artery stent and perform contrast angiography which demonstrated subtotal occlusion of the stent.  I initially placed a Glidewire out and was able to carry the IM guide to the end of the stent and then placed an 014 wire and performed plain balloon angioplasty after 5000's of heparin was administered.  This demonstrated resolution of the stenosis and a 5 x 40 mm drug-coated balloon was then inflated to nominal pressure for 3 minutes.  Much of the balloon was actually in the aorta but we had apposition across the entire stent in the ostium.  Completion demonstrated no residual stenosis.  Satisfied with this we exchanged for a Bentson wire and then removed the catheter and then deployed a minx device in the right groin.  She tolerated the procedure without immediate complication.    Contrast: 20cc    Vondra Aldredge C. Randie Heinz, MD [facility] Office: [phone] Pager: [phone]

## 2023-04-05 ENCOUNTER — Encounter (HOSPITAL_COMMUNITY): Payer: Self-pay | Admitting: Vascular Surgery

## 2023-04-06 NOTE — Progress Notes (Signed)
This encounter was created in error - please disregard.

## 2023-04-06 NOTE — Addendum Note (Signed)
Addended by: Chilton Si C on: 04/06/2023 03:20 PM   Modules accepted: Orders

## 2023-04-15 ENCOUNTER — Ambulatory Visit (HOSPITAL_BASED_OUTPATIENT_CLINIC_OR_DEPARTMENT_OTHER): Payer: Medicare Other | Admitting: Family

## 2023-05-17 ENCOUNTER — Other Ambulatory Visit: Payer: Self-pay | Admitting: *Deleted

## 2023-05-17 DIAGNOSIS — I701 Atherosclerosis of renal artery: Secondary | ICD-10-CM

## 2023-05-25 ENCOUNTER — Ambulatory Visit (INDEPENDENT_AMBULATORY_CARE_PROVIDER_SITE_OTHER): Payer: Medicare Other | Admitting: Vascular Surgery

## 2023-05-25 ENCOUNTER — Ambulatory Visit (HOSPITAL_COMMUNITY)
Admission: RE | Admit: 2023-05-25 | Discharge: 2023-05-25 | Disposition: A | Discharge status: Home / Self Care | Payer: Medicare Other | Source: Ambulatory Visit | Attending: Vascular Surgery | Admitting: Vascular Surgery

## 2023-05-25 ENCOUNTER — Encounter: Payer: Self-pay | Admitting: Vascular Surgery

## 2023-05-25 VITALS — BP 128/67 | HR 77 | Temp 98.0°F | Resp 20 | Ht 64.0 in | Wt 175.0 lb

## 2023-05-25 DIAGNOSIS — I701 Atherosclerosis of renal artery: Secondary | ICD-10-CM | POA: Diagnosis present

## 2023-05-25 NOTE — Progress Notes (Signed)
Patient ID: Karen Jimenez, female   DOB: [birth year], 67 y.o.   MRN: [id number]  Reason for Consult: Routine Post Op   Referred by Shon Hale, *  Subjective:     HPI:  Karen Jimenez is a 67 y.o. female with history of left renal artery stenting recently underwent drug-coated balloon angioplasty of in-stent restenosis.  She states since that time her blood pressure has been much better controlled and she is actually decreased her clonidine and hopeful to come off of amlodipine at her next visit.  She continues on aspirin and Plavix.  Past Medical History:  Diagnosis Date   Anxiety 12/29/2017   Benign hypertension with chronic kidney disease, stage III (HCC) 02/25/2020   History of smoking    Hx of hyperlipidemia    Hyperlipidemia    Hypertension    Hyponatremia 02/13/2020   Left renal artery stenosis (HCC) 02/13/2020   Low back pain 04/18/2020   Prediabetes 01/21/2017   SVT (supraventricular tachycardia) (HCC) 01/21/2017   Vitamin D deficiency disease 01/21/2017   Family History  Problem Relation Age of Onset   Colon cancer Mother 27   Lung cancer Father 45   Past Surgical History:  Procedure Laterality Date   APPENDECTOMY     KNEE ARTHROSCOPY Right 2011, 2012   MEDIAL PARTIAL KNEE REPLACEMENT  11/2010   PERIPHERAL VASCULAR INTERVENTION  02/15/2022   Procedure: PERIPHERAL VASCULAR INTERVENTION;  Surgeon: Maeola Harman, MD;  Location: [facility] INVASIVE CV LAB;  Service: Cardiovascular;;  LT Renal   RENAL ANGIOGRAPHY N/A 02/15/2022   Procedure: RENAL ANGIOGRAPHY;  Surgeon: Maeola Harman, MD;  Location: [facility] INVASIVE CV LAB;  Service: Cardiovascular;  Laterality: N/A;   RENAL ANGIOGRAPHY N/A 04/04/2023   Procedure: RENAL ANGIOGRAPHY;  Surgeon: Maeola Harman, MD;  Location: [facility] INVASIVE CV LAB;  Service: Cardiovascular;  Laterality: N/A;   TONSILLECTOMY AND ADENOIDECTOMY      Short Social History:  Social History   Tobacco Use    Smoking status: Former    Current packs/day: 0.50    Average packs/day: 0.5 packs/day for 30.0 years (15.0 ttl pk-yrs)    Types: Cigarettes   Smokeless tobacco: Never  Substance Use Topics   Alcohol use: No    Allergies  Allergen Reactions   Amoxicillin Anaphylaxis and Hives   Losartan Palpitations and Other (See Comments)    Severe AKI   Pseudoephedrine Hcl Palpitations    Heart rate increases   Hydralazine Nausea Only and Rash   Amlodipine Swelling    Pt currently taking in 10 mg    Hydrocodone Nausea And Vomiting    Headache   Hydrocodone-Acetaminophen Nausea And Vomiting   Nitrofurantoin Other (See Comments)    Body aches    Current Outpatient Medications  Medication Sig Dispense Refill   aspirin EC 81 MG tablet Take 81 mg by mouth daily. Swallow whole.     carvedilol (COREG) 25 MG tablet Take 25 mg by mouth 2 (two) times daily with a meal.     cloNIDine (CATAPRES) 0.1 MG tablet Please take 1 tablet in am and 1 tablet afternoon.     cloNIDine (CATAPRES) 0.2 MG tablet Take 0.2 mg by mouth every evening.     clopidogrel (PLAVIX) 75 MG tablet Take 1 tablet (75 mg total) by mouth daily. 30 tablet 6   doxazosin (CARDURA) 8 MG tablet Take 8 mg by mouth 2 (two) times daily.     furosemide (LASIX) 40 MG tablet Take 40  mg by mouth daily.     rosuvastatin (CRESTOR) 10 MG tablet Take 1 tablet (10 mg total) by mouth daily. 30 tablet 11   amLODipine (NORVASC) 10 MG tablet Take 1 tablet (10 mg total) by mouth daily. 30 tablet 0   No current facility-administered medications for this visit.    Review of Systems  Constitutional: Positive for fatigue.  HENT: HENT negative.  Eyes: Eyes negative.  Respiratory: Respiratory negative.  Cardiovascular: Cardiovascular negative.  GI: Gastrointestinal negative.  Musculoskeletal: Musculoskeletal negative.  Skin: Skin negative.  Neurological: Neurological negative. Hematologic: Hematologic/lymphatic negative.  Psychiatric: Psychiatric  negative.        Objective:  Objective   Vitals:   05/25/23 0927  BP: 128/67  Pulse: 77  Resp: 20  Temp: 98 F (36.7 C)  SpO2: 98%  Weight: 175 lb (79.4 kg)  Height: 5\' 4"  (1.626 m)   Body mass index is 30.04 kg/m.  Physical Exam HENT:     Head: Normocephalic.     Mouth/Throat:     Mouth: Mucous membranes are moist.  Eyes:     Pupils: Pupils are equal, round, and reactive to light.  Cardiovascular:     Rate and Rhythm: Normal rate.  Pulmonary:     Effort: Pulmonary effort is normal.  Abdominal:     General: Abdomen is flat.     Palpations: Abdomen is soft. There is no mass.  Musculoskeletal:     Cervical back: Normal range of motion.     Right lower leg: No edema.     Left lower leg: No edema.  Skin:    General: Skin is warm.     Capillary Refill: Capillary refill takes less than 2 seconds.  Neurological:     General: No focal deficit present.     Mental Status: She is alert.  Psychiatric:        Mood and Affect: Mood normal.        Thought Content: Thought content normal.        Judgment: Judgment normal.     Data: Duplex Findings:  +----------+--------+--------+------+--------+  MesentericPSV cm/sEDV cm/sPlaqueComments  +----------+--------+--------+------+--------+  Aorta Mid   198                           +----------+--------+--------+------+--------+           +------------------+--------+--------+-------+  Right Renal ArteryPSV cm/sEDV cm/sComment  +------------------+--------+--------+-------+  Proximal           143      27            +------------------+--------+--------+-------+  Mid                 95      17            +------------------+--------+--------+-------+  Distal              88      16            +------------------+--------+--------+-------+   +-----------------+--------+--------+--------+  Left Renal ArteryPSV cm/sEDV cm/sComment   +-----------------+--------+--------+--------+   Proximal          234      33   tortuous  +-----------------+--------+--------+--------+  Mid               265      33   tortuous  +-----------------+--------+--------+--------+  Distal             92  22             +-----------------+--------+--------+--------+   +------------+--------+--------+----+-----------+--------+--------+----+  Right KidneyPSV cm/sEDV cm/sRI  Left KidneyPSV cm/sEDV cm/sRI    +------------+--------+--------+----+-----------+--------+--------+----+  Upper Pole  30      7       0.75Upper Pole 32      6       0.80  +------------+--------+--------+----+-----------+--------+--------+----+  Mid        46      10      0.        43      9       0.79  +------------+--------+--------+----+-----------+--------+--------+----+  Lower Pole  27      7       0.74Lower Pole 33      7       0.79  +------------+--------+--------+----+-----------+--------+--------+----+  Hilar      84      17      0.80Hilar                            +------------+--------+--------+----+-----------+--------+--------+----+   +------------------+----+------------------+----+  Right Kidney          Left Kidney             +------------------+----+------------------+----+  RAR                  RAR                     +------------------+----+------------------+----+  RAR (manual)      0.73RAR (manual)      1.34  +------------------+----+------------------+----+  Cortex               Cortex                  +------------------+----+------------------+----+  Cortex thickness      Corex thickness         +------------------+----+------------------+----+  Kidney length (cm)8.58Kidney length (cm)9.58  +------------------+----+------------------+----+     Summary:  Renal:    Right: Normal size right kidney. No evidence of right renal artery         stenosis. Abnormal right Resistive Index.  Left:   Normal size of left kidney. Evidence of a > 60% stenosis in         the left renal artery, although velocities may be         overestimated due to tortuousity.      Assessment/Plan:    67 year old female status post drug-coated balloon angioplasty left renal artery in-stent restenosis now with improvement in her blood pressure.  There are some elevated velocities on today's duplex but these are likely due to stenting and tortuosity given that her blood pressures are much better controlled nothing there is any indication for further surgical patient.  She will continue aspirin and Plavix indefinitely given previous issues with stent and we will plan to see her back in 6 months with repeat renal artery stent duplex.     Maeola Harman MD [facility]

## 2023-06-03 ENCOUNTER — Other Ambulatory Visit: Payer: Self-pay

## 2023-06-03 DIAGNOSIS — I701 Atherosclerosis of renal artery: Secondary | ICD-10-CM

## 2023-08-14 ENCOUNTER — Ambulatory Visit
Admission: EM | Admit: 2023-08-14 | Discharge: 2023-08-14 | Disposition: A | Discharge status: Home / Self Care | Payer: Medicare Other | Attending: Internal Medicine | Admitting: Internal Medicine

## 2023-08-14 ENCOUNTER — Ambulatory Visit (INDEPENDENT_AMBULATORY_CARE_PROVIDER_SITE_OTHER): Payer: Medicare Other | Admitting: Radiology

## 2023-08-14 ENCOUNTER — Encounter: Payer: Self-pay | Admitting: Emergency Medicine

## 2023-08-14 DIAGNOSIS — R052 Subacute cough: Secondary | ICD-10-CM | POA: Diagnosis not present

## 2023-08-14 DIAGNOSIS — J209 Acute bronchitis, unspecified: Secondary | ICD-10-CM

## 2023-08-14 MED ORDER — DEXAMETHASONE SODIUM PHOSPHATE 10 MG/ML IJ SOLN
10.0000 mg | Freq: Once | INTRAMUSCULAR | Status: AC
  Administered 2023-08-14: 10 mg via INTRAMUSCULAR

## 2023-08-14 MED ORDER — AEROCHAMBER PLUS FLO-VU MEDIUM MISC
1.0000 | Freq: Once | Status: AC
  Administered 2023-08-14: 1

## 2023-08-14 MED ORDER — ALBUTEROL SULFATE HFA 108 (90 BASE) MCG/ACT IN AERS
2.0000 | INHALATION_SPRAY | Freq: Once | RESPIRATORY_TRACT | Status: AC
  Administered 2023-08-14: 2 via RESPIRATORY_TRACT

## 2023-08-14 MED ORDER — PROMETHAZINE-DM 6.25-15 MG/5ML PO SYRP: 5.0000 mL | ORAL_SOLUTION | Freq: Every evening | ORAL | 0 refills | Status: DC | PRN

## 2023-08-14 MED ORDER — IPRATROPIUM-ALBUTEROL 0.5-2.5 (3) MG/3ML IN SOLN
3.0000 mL | Freq: Once | RESPIRATORY_TRACT | Status: AC
  Administered 2023-08-14: 3 mL via RESPIRATORY_TRACT

## 2023-08-14 NOTE — ED Provider Notes (Signed)
[name]    CSN: [id number] Arrival date & time: 08/14/23  1227      History   Chief Complaint Chief Complaint  Patient presents with  . Cough    HPI Karen Jimenez is a 68 y.o. female.   Karen Jimenez is a 68 y.o. female with history of renal artery stenosis, CAD, CHF, CKD3, prediabetes, and HTN presenting for chief complaint of Cough, congestion, and intermittent shortness of breath with coughing that started 4 weeks ago. Cough was improving a few weeks ago but then worsened again a few days ago. Denies recent fevers, chills, sore throat, ear pain, dizziness, chest pain, rash, and chills. Former smoker, denies history of chronic respiratory problems. Cough is much worse at nighttime. She has been taking OTC medicines for symptoms without much relief.    Cough   Past Medical History:  Diagnosis Date  . Anxiety 12/29/2017  . Benign hypertension with chronic kidney disease, stage III (HCC) 02/25/2020  . History of smoking   . Hx of hyperlipidemia   . Hyperlipidemia   . Hypertension   . Hyponatremia 02/13/2020  . Left renal artery stenosis (HCC) 02/13/2020  . Low back pain 04/18/2020  . Prediabetes 01/21/2017  . SVT (supraventricular tachycardia) (HCC) 01/21/2017  . Vitamin D deficiency disease 01/21/2017    Patient Active Problem List   Diagnosis Date Noted  . CKD stage 3b, GFR 30-44 ml/min (HCC) 03/22/2023  . Hypertensive CHF (congestive heart failure) (HCC) 03/22/2023  . Diastolic congestive heart failure (HCC) 03/21/2023  . Acute on chronic diastolic CHF (congestive heart failure) (HCC) 03/21/2023  . Acute pulmonary edema (HCC) 03/21/2023  . Acute diverticulitis 06/27/2022  . Hyperkalemia 06/27/2022  . Hypertension 06/27/2022  . AKI (acute kidney injury) (HCC) 06/27/2022  . CAD (coronary artery disease) 06/27/2022  . Low back pain 04/18/2020  . Weight gain 02/25/2020  . Benign hypertension with chronic kidney disease, stage III (HCC)  02/25/2020  . Hyponatremia 02/13/2020  . Left renal artery stenosis (HCC) 02/13/2020  . Persistent proteinuria 02/13/2020  . Hypertensive urgency 02/04/2020  . Anxiety 12/29/2017  . NSTEMI (non-ST elevated myocardial infarction) (HCC) 01/21/2017  . Prediabetes 01/21/2017  . Pure hypercholesterolemia 01/21/2017  . SVT (supraventricular tachycardia) (HCC) 01/21/2017  . Vitamin D deficiency disease 01/21/2017  . Paronychia of finger 01/21/2017  . Knee joint replacement by other means 09/24/2013    Past Surgical History:  Procedure Laterality Date  . APPENDECTOMY    . KNEE ARTHROSCOPY Right 2011, 2012  . MEDIAL PARTIAL KNEE REPLACEMENT  11/2010  . PERIPHERAL VASCULAR INTERVENTION  02/15/2022   Procedure: PERIPHERAL VASCULAR INTERVENTION;  Surgeon: Maeola Harman, MD;  Location: [facility] INVASIVE CV LAB;  Service: Cardiovascular;;  LT Renal  . RENAL ANGIOGRAPHY N/A 02/15/2022   Procedure: RENAL ANGIOGRAPHY;  Surgeon: Maeola Harman, MD;  Location: [facility] INVASIVE CV LAB;  Service: Cardiovascular;  Laterality: N/A;  . RENAL ANGIOGRAPHY N/A 04/04/2023   Procedure: RENAL ANGIOGRAPHY;  Surgeon: Maeola Harman, MD;  Location: [facility] INVASIVE CV LAB;  Service: Cardiovascular;  Laterality: N/A;  . TONSILLECTOMY AND ADENOIDECTOMY      OB History     Gravida  2   Para  2   Term  2   Preterm  0   AB  0   Living  2      SAB  0   IAB  0   Ectopic  0   Multiple  0   Live Births  2  Home Medications    Prior to Admission medications   Medication Sig Start Date End Date Taking? Authorizing Provider  amLODipine (NORVASC) 10 MG tablet Take 1 tablet (10 mg total) by mouth daily. Patient not taking: Reported on 08/14/2023 03/22/23 04/21/23  Arrien, York Ram, MD  aspirin EC 81 MG tablet Take 81 mg by mouth daily. Swallow whole.    [provider]  carvedilol (COREG) 25 MG tablet Take 25 mg by mouth 2 (two) times daily with a meal.     [provider]  cloNIDine (CATAPRES) 0.1 MG tablet Please take 1 tablet in am and 1 tablet afternoon. 03/22/23   Arrien, York Ram, MD  cloNIDine (CATAPRES) 0.2 MG tablet Take 0.2 mg by mouth every evening.    [provider]  clopidogrel (PLAVIX) 75 MG tablet Take 1 tablet (75 mg total) by mouth daily. 03/09/23   Maeola Harman, MD  doxazosin (CARDURA) 8 MG tablet Take 8 mg by mouth 2 (two) times daily. 03/03/23   [provider]  furosemide (LASIX) 40 MG tablet Take 40 mg by mouth daily. 09/23/22   [provider]  rosuvastatin (CRESTOR) 10 MG tablet Take 1 tablet (10 mg total) by mouth daily. 03/09/23   Maeola Harman, MD    Family History Family History  Problem Relation Age of Onset  . Colon cancer Mother 68  . Lung cancer Father 64    Social History Social History   Tobacco Use  . Smoking status: Former    Current packs/day: 0.50    Average packs/day: 0.5 packs/day for 30.0 years (15.0 ttl pk-yrs)    Types: Cigarettes  . Smokeless tobacco: Never  Vaping Use  . Vaping status: Never Used  Substance Use Topics  . Alcohol use: No  . Drug use: No     Allergies   Amoxicillin, Losartan, Pseudoephedrine hcl, Hydralazine, Amlodipine, Hydrocodone, Hydrocodone-acetaminophen, and Nitrofurantoin   Review of Systems Review of Systems  Respiratory:  Positive for cough.   Per HPI   Physical Exam Triage Vital Signs ED Triage Vitals  Encounter Vitals Group     BP 08/14/23 1235 129/75     Systolic BP Percentile --      Diastolic BP Percentile --      Pulse Rate 08/14/23 1235 75     Resp 08/14/23 1235 19     Temp 08/14/23 1235 98.4 F (36.9 C)     Temp Source 08/14/23 1235 Oral     SpO2 08/14/23 1235 96 %     Weight --      Height --      Head Circumference --      Peak Flow --      Pain Score 08/14/23 1239 0     Pain Loc --      Pain Education --      Exclude from Growth Chart --    No data  found.  Updated Vital Signs BP 129/75 (BP Location: Right Arm)   Pulse 75   Temp 98.4 F (36.9 C) (Oral)   Resp 19   LMP 04/11/2009 (Within Days)   SpO2 96%   Visual Acuity Right Eye Distance:   Left Eye Distance:   Bilateral Distance:    Right Eye Near:   Left Eye Near:    Bilateral Near:     Physical Exam Vitals and nursing note reviewed.  Constitutional:      Appearance: She is not ill-appearing or toxic-appearing.  HENT:  Head: Normocephalic and atraumatic.     Right Ear: Hearing, tympanic membrane, ear canal and external ear normal.     Left Ear: Hearing, tympanic membrane, ear canal and external ear normal.     Nose: Nose normal.     Mouth/Throat:     Lips: Pink.     Mouth: Mucous membranes are moist. No injury or oral lesions.     Dentition: Normal dentition.     Tongue: No lesions.     Pharynx: Oropharynx is clear. Uvula midline. No pharyngeal swelling, oropharyngeal exudate, posterior oropharyngeal erythema, uvula swelling or postnasal drip.     Tonsils: No tonsillar exudate.  Eyes:     General: Lids are normal. Vision grossly intact. Gaze aligned appropriately.     Extraocular Movements: Extraocular movements intact.     Conjunctiva/sclera: Conjunctivae normal.  Neck:     Trachea: Trachea and phonation normal.  Cardiovascular:     Rate and Rhythm: Normal rate and regular rhythm.     Heart sounds: Normal heart sounds, S1 normal and S2 normal.  Pulmonary:     Effort: Pulmonary effort is normal. No respiratory distress.     Breath sounds: Normal air entry. Wheezing (inspiratory and expiratory wheezing to all lung fields throughout bilaterally) present.     Comments: Speaking in full sentences without increased respiratory effort or audible wheezing.  Musculoskeletal:     Cervical back: Neck supple.     Right lower leg: No edema.     Left lower leg: No edema.  Lymphadenopathy:     Cervical: No cervical adenopathy.  Skin:    General: Skin is warm and  dry.     Capillary Refill: Capillary refill takes less than 2 seconds.     Findings: No rash.  Neurological:     General: No focal deficit present.     Mental Status: She is alert and oriented to person, place, and time. Mental status is at baseline.     Cranial Nerves: No dysarthria or facial asymmetry.  Psychiatric:        Mood and Affect: Mood normal.        Speech: Speech normal.        Behavior: Behavior normal.        Thought Content: Thought content normal.        Judgment: Judgment normal.     UC Treatments / Results  Labs (all labs ordered are listed, but only abnormal results are displayed) Labs Reviewed - No data to display  EKG   Radiology DG Chest 2 View Result Date: 08/14/2023 CLINICAL DATA:  cough for 5 weeks EXAM: CHEST - 2 VIEW COMPARISON:  Chest XR, 03/21/2023.  CT AP, 06/27/2022. FINDINGS: Cardiac silhouette is within normal limits. Mild burden of aortic arch atherosclerosis. Lungs are well inflated. No focal consolidation or mass. No pleural effusion or pneumothorax. No acute displaced fracture. IMPRESSION: 1. No acute cardiopulmonary process. 2.  Aortic Atherosclerosis (ICD10-I70.0). Electronically Signed   By: Roanna Banning M.D.   On: 08/14/2023 13:30    Procedures Procedures (including critical care time)  Medications Ordered in UC Medications  ipratropium-albuterol (DUONEB) 0.5-2.5 (3) MG/3ML nebulizer solution 3 mL (3 mLs Nebulization Given 08/14/23 1313)  dexamethasone (DECADRON) injection 10 mg (10 mg Intramuscular Given 08/14/23 1310)    Initial Impression / Assessment and Plan / UC Course  I have reviewed the triage vital signs and the nursing notes.  Pertinent labs & imaging results that were available during my care of the  patient were reviewed by me and considered in my medical decision making (see chart for details).     *** Final Clinical Impressions(s) / UC Diagnoses   Final diagnoses:  Subacute cough  Acute bronchitis, unspecified  organism     Discharge Instructions      You have bronchitis which is inflammation of the upper airways in your lungs due to a virus. The following medicines will help with your symptoms.   - Take steroid pills sent to pharmacy as directed. Do not take any other NSAID containing medications such as ibuprofen or naproxen/Aleve while taking prednisone. - You may use albuterol inhaler 1 to 2 puffs every 4-6 hours as needed for cough, shortness of breath, and wheezing. - Take cough medicines as needed. - Use mucinex to break up congestion in nose/chest (guaifenesin 600mg  every 12 hours as needed).   If you develop any new or worsening symptoms or do not improve in the next 2 to 3 days, please return.  If your symptoms are severe, please go to the emergency room. Follow-up with PCP as needed.      ED Prescriptions   None    PDMP not reviewed this encounter.

## 2023-08-14 NOTE — Discharge Instructions (Addendum)
You have bronchitis which is inflammation of the upper airways in your lungs due to a virus. The following medicines will help with your symptoms.   - I gave you a shot of steroid in the clinic which will help to work in your body to reduce inflammation to the chest over the next 2-3 days.  - You may use albuterol inhaler 1 to 2 puffs every 4-6 hours as needed for cough, shortness of breath, and wheezing. - Take cough medicines as needed. - Use mucinex to break up congestion in nose/chest (guaifenesin 600mg  every 12 hours as needed).   If you develop any new or worsening symptoms or do not improve in the next 2 to 3 days, please return.  If your symptoms are severe, please go to the emergency room. Follow-up with PCP as needed.

## 2023-08-14 NOTE — ED Triage Notes (Signed)
Pt presents with severe cough. States she was sick around new years with sinus and cough and has not been able to get rid of cough. States she has rattle in lungs.

## 2023-09-26 ENCOUNTER — Telehealth: Payer: Self-pay

## 2023-09-26 NOTE — Telephone Encounter (Signed)
**Note De-Identified Karen Jimenez Obfuscation** Ordering provider: Dr Duke Salvia Associated diagnoses: Snoring-R06.83 and Somnolence-R40.0  WatchPAT PA obtained on 09/26/2023 by Karen Jimenez, Karen Formosa, LPN. Authorization: Per the Medicare Part A and B website, Medicare Part A and B do not require prior authorization for CPT Code 09811 ([place]-HST).  Patient notified of PIN ([phone]) on 09/26/2023 Karen Jimenez Notification Method: phone-No answer so I left a message on the pts VM asking her to call Karen Jimenez back at [phone].  Phone note routed to covering staff for follow-up.

## 2023-10-03 NOTE — Telephone Encounter (Signed)
**Note De-Identified Giovonnie Trettel Obfuscation** No answer so I left another message on the pts VM asking her to call Larita Fife back at Dr Sunday Spillers office.

## 2023-10-10 ENCOUNTER — Other Ambulatory Visit: Payer: Self-pay | Admitting: Vascular Surgery

## 2023-10-10 NOTE — Telephone Encounter (Signed)
 Called patient, she states she was seeing Dr. Duke Salvia for HTN, since getting her kidney stent placed her blood pressure is normal. She is undecided at this time if she wants to take or not. Advised she needs to take or return by Monday 4/7 to avoid billing. Patient verbalizes understanding and states she will let us know.

## 2023-10-16 ENCOUNTER — Encounter (INDEPENDENT_AMBULATORY_CARE_PROVIDER_SITE_OTHER): Payer: Self-pay | Admitting: Cardiology

## 2023-10-16 DIAGNOSIS — I10 Essential (primary) hypertension: Secondary | ICD-10-CM

## 2023-10-16 DIAGNOSIS — R0683 Snoring: Secondary | ICD-10-CM

## 2023-10-17 ENCOUNTER — Ambulatory Visit: Attending: Cardiovascular Disease

## 2023-10-17 ENCOUNTER — Telehealth: Payer: Self-pay | Admitting: *Deleted

## 2023-10-17 NOTE — Telephone Encounter (Signed)
 The patient has been notified of the result via her voicemail PER DPR . No significant sleep apnea.     10/17/2023 6:49 PM

## 2023-10-17 NOTE — Procedures (Signed)
   SLEEP STUDY REPORT Patient Information Study Date: 10/16/2023 Patient Name: Karen Jimenez Patient ID: [id number] Birth Date: [birth year] Age: 68 Gender: Female BMI: 28.6 (W=167 lb, H=5' 4'') Referring Physician: Chilton Si, MD  TEST DESCRIPTION: Home sleep apnea testing was completed using the WatchPat, a Type 1 device, utilizing  peripheral arterial tonometry (PAT), chest movement, actigraphy, pulse oximetry, pulse rate, body position and snore.  AHI was calculated with apnea and hypopnea using valid sleep time as the denominator. RDI includes apneas,  hypopneas, and RERAs. The data acquired and the scoring of sleep and all associated events were performed in  accordance with the recommended standards and specifications as outlined in the AASM Manual for the Scoring of  Sleep and Associated Events 2.2.0 (2015).   FINDINGS: 1. No evidence of Obstructive Sleep Apnea with AHI 3.8/hr.  2. No Central Sleep Apnea. 3. Oxygen desaturations as low as 88%. 4. Moderate to severe snoring was present. O2 sats were < 88% for 0.1 minutes. 5. Total sleep time was 7 hrs and 41 min. 6. 23% of total sleep time was spent in REM sleep.  7. Normal sleep onset latency at 19 min.  8. Shortened REM sleep onset latency at 41 min.  9. Total awakenings were 6.   DIAGNOSIS:  Normal study with no significant sleep disordered breathing.  RECOMMENDATIONS: 1. Normal study with no significant sleep disordered breathing. 2. Healthy sleep recommendations include: adequate nightly sleep (normal 7-9 hrs/night), avoidance of caffeine after  noon and alcohol near bedtime, and maintaining a sleep environment that is cool, dark and quiet. 3. Weight loss for overweight patients is recommended.  4. Snoring recommendations include: weight loss where appropriate, side sleeping, and avoidance of alcohol before  bed. 5. Operation of motor vehicle or dangerous equipment must be avoided when feeling drowsy,  excessively sleepy, or  mentally fatigued.  6. An ENT consultation which may be useful for specific causes of and possible treatment of bothersome snoring .  7. Weight loss may be of benefit in reducing the severity of snoring. 3 Signature: Armanda Magic, MD; [facility]; Diplomat, American Board of Sleep  Medicine Electronically Signed: 10/17/2023 8:35:15 AM

## 2023-10-17 NOTE — Telephone Encounter (Signed)
-----   Message from Armanda Magic sent at 10/17/2023  8:36 AM EDT ----- Please let patient know that sleep study showed no significant sleep apnea.

## 2023-11-09 ENCOUNTER — Ambulatory Visit (HOSPITAL_COMMUNITY)
Admission: RE | Admit: 2023-11-09 | Discharge: 2023-11-09 | Disposition: A | Discharge status: Home / Self Care | Payer: Medicare Other | Source: Ambulatory Visit | Attending: Vascular Surgery | Admitting: Vascular Surgery

## 2023-11-09 ENCOUNTER — Ambulatory Visit: Payer: Medicare Other | Attending: Vascular Surgery | Admitting: Physician Assistant

## 2023-11-09 VITALS — BP 144/85 | HR 70 | Temp 97.6°F | Wt 178.9 lb

## 2023-11-09 DIAGNOSIS — I701 Atherosclerosis of renal artery: Secondary | ICD-10-CM | POA: Diagnosis present

## 2023-11-09 NOTE — Progress Notes (Signed)
 Office Note     CC:  follow up Requesting Provider:  Ransom Byers, *  HPI: Karen Jimenez is a 68 y.o. (Nov 29, 1955) female who presents for surveillance follow up of left renal artery stenting. She has also undergone DCB angioplasty of the renal artery stent in September of 2024 due to in stent restenosis.   Today she says overall she has been feeling great. Improved energy. She says she has gotten back to exercising. She denies any swelling or shortness of breath. Her blood pressure at home has been in the 130's. She says she does not check it every day though. She is being followed by Dr. Christianne Cowper for her CKD. She has follow up with her tomorrow 11/10/23. She says she will be retiring in December and is expecting her first grandchild.   She is medically managed on Aspirin , Plavix , statin. She is on BB, Cardura  and Clonidine  for HTN.   Past Medical History:  Diagnosis Date   Anxiety 12/29/2017   Benign hypertension with chronic kidney disease, stage III (HCC) 02/25/2020   History of smoking    Hx of hyperlipidemia    Hyperlipidemia    Hypertension    Hyponatremia 02/13/2020   Left renal artery stenosis (HCC) 02/13/2020   Low back pain 04/18/2020   Prediabetes 01/21/2017   SVT (supraventricular tachycardia) (HCC) 01/21/2017   Vitamin D deficiency disease 01/21/2017    Past Surgical History:  Procedure Laterality Date   APPENDECTOMY     KNEE ARTHROSCOPY Right 2011, 2012   MEDIAL PARTIAL KNEE REPLACEMENT  11/2010   PERIPHERAL VASCULAR INTERVENTION  02/15/2022   Procedure: PERIPHERAL VASCULAR INTERVENTION;  Surgeon: Adine Hoof, MD;  Location: [facility] INVASIVE CV LAB;  Service: Cardiovascular;;  LT Renal   RENAL ANGIOGRAPHY N/A 02/15/2022   Procedure: RENAL ANGIOGRAPHY;  Surgeon: Adine Hoof, MD;  Location: [facility] INVASIVE CV LAB;  Service: Cardiovascular;  Laterality: N/A;   RENAL ANGIOGRAPHY N/A 04/04/2023   Procedure: RENAL ANGIOGRAPHY;  Surgeon: Adine Hoof, MD;  Location: [facility] INVASIVE CV LAB;  Service: Cardiovascular;  Laterality: N/A;   TONSILLECTOMY AND ADENOIDECTOMY      Social History   Socioeconomic History   Marital status: Married    Spouse name: Not on file   Number of children: 2   Years of education: Not on file   Highest education level: Not on file  Occupational History    Employer: [employer]  Tobacco Use   Smoking status: Former    Current packs/day: 0.50    Average packs/day: 0.5 packs/day for 30.0 years (15.0 ttl pk-yrs)    Types: Cigarettes   Smokeless tobacco: Never  Vaping Use   Vaping status: Never Used  Substance and Sexual Activity   Alcohol use: No   Drug use: No   Sexual activity: Never    Partners: Male    Birth control/protection: Post-menopausal  Other Topics Concern   Not on file  Social History Narrative   Not on file   Social Drivers of Health   Financial Resource Strain: Not on file  Food Insecurity: No Food Insecurity (03/21/2023)   Hunger Vital Sign    Worried About Running Out of Food in the Last Year: Never true    Ran Out of Food in the Last Year: Never true  Transportation Needs: No Transportation Needs (03/21/2023)   PRAPARE - Administrator, Civil Service (Medical): No    Lack of Transportation (Non-Medical): No  Physical Activity: Inactive (  11/09/2022)   Exercise Vital Sign    Days of Exercise per Week: 0 days    Minutes of Exercise per Session: 0 min  Stress: Not on file  Social Connections: Not on file  Intimate Partner Violence: Not At Risk (03/21/2023)   Humiliation, Afraid, Rape, and Kick questionnaire    Fear of Current or Ex-Partner: No    Emotionally Abused: No    Physically Abused: No    Sexually Abused: No    Family History  Problem Relation Age of Onset   Colon cancer Mother 74   Lung cancer Father 20    Current Outpatient Medications  Medication Sig Dispense Refill   aspirin  EC 81 MG tablet Take 81 mg by mouth daily. Swallow  whole.     carvedilol  (COREG ) 25 MG tablet Take 25 mg by mouth 2 (two) times daily with a meal.     cloNIDine  (CATAPRES ) 0.1 MG tablet Please take 1 tablet in am and 1 tablet afternoon.     cloNIDine  (CATAPRES ) 0.2 MG tablet Take 0.2 mg by mouth every evening.     clopidogrel  (PLAVIX ) 75 MG tablet TAKE 1 TABLET(75 MG) BY MOUTH DAILY 30 tablet 6   doxazosin  (CARDURA ) 8 MG tablet Take 8 mg by mouth 2 (two) times daily.     furosemide  (LASIX ) 40 MG tablet Take 40 mg by mouth daily.     promethazine -dextromethorphan (PROMETHAZINE -DM) 6.25-15 MG/5ML syrup Take 5 mLs by mouth at bedtime as needed for cough. 118 mL 0   rosuvastatin  (CRESTOR ) 10 MG tablet Take 1 tablet (10 mg total) by mouth daily. 30 tablet 11   No current facility-administered medications for this visit.    Allergies  Allergen Reactions   Amoxicillin Anaphylaxis and Hives   Losartan Palpitations and Other (See Comments)    Severe AKI   Pseudoephedrine Hcl Palpitations    Heart rate increases   Hydralazine  Nausea Only and Rash   Amlodipine  Swelling    Pt currently taking in 10 mg    Hydrocodone Nausea And Vomiting    Headache   Hydrocodone-Acetaminophen  Nausea And Vomiting   Nitrofurantoin Other (See Comments)    Body aches     REVIEW OF SYSTEMS:   [X]  denotes positive finding, [ ]  denotes negative finding Cardiac  Comments:  Chest pain or chest pressure:    Shortness of breath upon exertion:    Short of breath when lying flat:    Irregular heart rhythm:        Vascular    Pain in calf, thigh, or hip brought on by ambulation:    Pain in feet at night that wakes you up from your sleep:     Blood clot in your veins:    Leg swelling:         Pulmonary    Oxygen at home:    Productive cough:     Wheezing:         Neurologic    Sudden weakness in arms or legs:     Sudden numbness in arms or legs:     Sudden onset of difficulty speaking or slurred speech:    Temporary loss of vision in one eye:     Problems  with dizziness:         Gastrointestinal    Blood in stool:     Vomited blood:         Genitourinary    Burning when urinating:     Blood in urine:  Psychiatric    Major depression:         Hematologic    Bleeding problems:    Problems with blood clotting too easily:        Skin    Rashes or ulcers:        Constitutional    Fever or chills:      PHYSICAL EXAMINATION:  Vitals:   11/09/23 1007  BP: (!) 144/85  Pulse: 70  Temp: 97.6 F (36.4 C)  TempSrc: Temporal  SpO2: 97%  Weight: 178 lb 14.4 oz (81.1 kg)    General:  WDWN in NAD; vital signs documented above Gait: Normal HENT: WNL, normocephalic Pulmonary: normal non-labored breathing without wheezing Cardiac: regular HR Abdomen: soft Vascular Exam/Pulses: 2+ femoral, 2+ radial, 2+ DP pulses bilaterally Extremities: without ischemic changes, without Gangrene , without cellulitis; without open wounds;  Musculoskeletal: no muscle wasting or atrophy  Neurologic: A&O X 3 Psychiatric:  The pt has Normal affect.   Non-Invasive Vascular Imaging:   Duplex Findings:   +------------------+--------+--------+-------+  Right Renal ArteryPSV cm/sEDV cm/sComment  +------------------+--------+--------+-------+  Proximal           155      33            +------------------+--------+--------+-------+  Mid                 94      17            +------------------+--------+--------+-------+  Distal              91      18            +------------------+--------+--------+-------+   +-----------------+--------+--------+--------+  Left Renal ArteryPSV cm/sEDV cm/sComment   +-----------------+--------+--------+--------+  Proximal          279      83   tortuous  +-----------------+--------+--------+--------+  Mid               330      55   tortuous  +-----------------+--------+--------+--------+  Distal            127      30              +-----------------+--------+--------+--------+   +------------+--------+--------+----+-----------+--------+--------+----+  Right KidneyPSV cm/sEDV cm/sRI  Left KidneyPSV cm/sEDV cm/sRI    +------------+--------+--------+----+-----------+--------+--------+----+  Upper Pole  33      7       0.78Upper Pole 47      14      0.70  +------------+--------+--------+----+-----------+--------+--------+----+  Mid        24      8       0.        46      13      0.71  +------------+--------+--------+----+-----------+--------+--------+----+  Lower Pole  21      6       0.72Lower Pole 50      19      0.62  +------------+--------+--------+----+-----------+--------+--------+----+  Hilar                          Hilar                            +------------+--------+--------+----+-----------+--------+--------+----+   +------------------+----+------------------+---+  Right Kidney          Left Kidney            +------------------+----+------------------+---+  RAR                  RAR                    +------------------+----+------------------+---+  RAR (manual)      1.04RAR (manual)      2.2  +------------------+----+------------------+---+  Cortex               Cortex                 +------------------+----+------------------+---+  Cortex thickness      Corex thickness        +------------------+----+------------------+---+  Kidney length (cm)8.57Kidney length (cm)     +------------------+----+------------------+---+   Summary:  Renal:  Right: Normal size right kidney. No evidence of right renal artery stenosis. Abnormal right Resistive Index.  Left: Normal size of left kidney. Evidence of a > 60% stenosis in the left renal artery. Velocities may be overestimated due to tortuosity.   ASSESSMENT/PLAN:: 68 y.o. female here for surveillance follow up of left renal artery stenting. She has also undergone DCB  angioplasty of the renal artery stent in September of 2024 due to in stent restenosis. Presently her blood pressure is managed on 3 agents. She is scheduled to see her Nephrologist, Dr. Christianne Cowper tomorrow for follow up labs. Her prior reported Scr was trending upward. Her duplex today suggests some increasing velocity in her renal artery stent. At this time would not recommend any intervention. However, if her renal function is worsening it may warrant earlier intervention. - continue Aspirin , Plavix  and Statin - continue adequate blood pressure management  - Will arrange follow up with Dr. Vikki Graves in 6 months with repeat renal artery duplex   Karen Finical, PA-C [facility] [phone]  Clinic MD:  Vikki Graves

## 2023-11-17 ENCOUNTER — Other Ambulatory Visit: Payer: Self-pay | Admitting: *Deleted

## 2023-11-17 DIAGNOSIS — I701 Atherosclerosis of renal artery: Secondary | ICD-10-CM

## 2023-12-09 ENCOUNTER — Ambulatory Visit: Admitting: Physical Therapy

## 2023-12-22 ENCOUNTER — Ambulatory Visit (HOSPITAL_BASED_OUTPATIENT_CLINIC_OR_DEPARTMENT_OTHER): Admitting: Family

## 2023-12-22 VITALS — BP 137/80 | HR 83 | Ht 64.0 in | Wt 174.2 lb

## 2023-12-22 DIAGNOSIS — I10 Essential (primary) hypertension: Secondary | ICD-10-CM

## 2023-12-22 DIAGNOSIS — E119 Type 2 diabetes mellitus without complications: Secondary | ICD-10-CM

## 2023-12-22 DIAGNOSIS — I701 Atherosclerosis of renal artery: Secondary | ICD-10-CM

## 2023-12-22 MED ORDER — CLONIDINE HCL 0.1 MG PO TABS
0.1000 mg | ORAL_TABLET | Freq: Every day | ORAL | Status: AC
Start: 2023-12-22 — End: ?

## 2023-12-22 NOTE — Patient Instructions (Addendum)
 Medication Instructions:  Continue your current medications.    Testing/Procedures: Your EKG today looked great!   Follow-Up: Please follow up in 1 year in [facility]IN[facility] with Dr. Theodis Fiscal, Neomi Banks, NP or Donivan Furry PharmD    Special Instructions:   Let us  know if you would like a referral to PREP exercise program at [facility] or to meet with a registered dietician let us  know and we will gladly place a referral!  Let us  know if your blood pressure is consistently more than 130/80 at home.

## 2023-12-22 NOTE — Progress Notes (Signed)
 Advanced [facility]pertension [facility] Assessment:    Date:  12/22/2023   ID:  Karen Jimenez, DOB 1956/02/02, MRN [id number]  PCP:  Ransom Byers, MD  Cardiologist:  Maudine Sos, MD  Nephrologist: Dr. Christianne Cowper  Referring MD: Ransom Byers, *   CC: Hypertension  History of Present Illness:    Karen Jimenez is a 68 y.o. female with a hx of HTN, DM2, renal artery stenosis s/p L renal artery stenting and repeat drug coated balloon angioplasty 04/04/23, CKD3b, HLD, prior tobacco use here to follow up in the [facility]pertension Clinic.   Prior stenting of L renal artery 02/2022. Established with [facility] 10/2022. Previously had hyperkalemia on Spironolactone. Metoprolol  changed to carvedilol . Labs negative for hyperaldosteronism. Catecholamines and metanephrines were normal. Previously did not tolerate higher doses of Clonidine  with fatigue.   Admitted 03/2023 with acute on chronic diastolic heart failure. Discharged on Lasix  40mg  daily.   On 04/04/23 underwent drug coated balloon angioplasty of left renal artery ISR. She saw [facility] 11/09/23 for follow up BP controlled, duplex with some increasing velocity in her renal artery stent.' With recommendation for repeat interventiononly if renal function worsening.   Presents today for follow up independently. She has experienced lower blood pressure readings at home since her balloon stent procedure in September. She discontinued Ozempic after four weeks due to feeling sluggish and unwell. Her A1c was 6.5 in May, and she is considering other medication options such as Mounjaro.   Her current medications include clonidine  0.1 mg at night (reduced by nephrology team), carvedilol  twice a day, and doxazosin  twice a day. She has been able to stop taking amlodipine .  No pain in her legs while walking, but they get tired. She uses a treadmill, managing 12 minutes in the morning and 20 minutes in the afternoon. She is  considering an exercise program to improve stamina.   Previous antihypertensives: Spironolactone - hyperkalemia Amlodipine  - LE edema Metoprolol  - changed to carvedilol  Hydralazine  Losartan  Past Medical History:  Diagnosis Date   Anxiety 12/29/2017   Benign hypertension with chronic kidney disease, stage III (HCC) 02/25/2020   History of smoking    Hx of hyperlipidemia    Hyperlipidemia    Hypertension    Hyponatremia 02/13/2020   Left renal artery stenosis (HCC) 02/13/2020   Low back pain 04/18/2020   Prediabetes 01/21/2017   SVT (supraventricular tachycardia) (HCC) 01/21/2017   Vitamin D deficiency disease 01/21/2017    Past Surgical History:  Procedure Laterality Date   APPENDECTOMY     KNEE ARTHROSCOPY Right 2011, 2012   MEDIAL PARTIAL KNEE REPLACEMENT  11/2010   PERIPHERAL VASCULAR INTERVENTION  02/15/2022   Procedure: PERIPHERAL VASCULAR INTERVENTION;  Surgeon: Adine Hoof, MD;  Location: [facility] INVASIVE CV LAB;  Service: Cardiovascular;;  LT Renal   RENAL ANGIOGRAPHY N/A 02/15/2022   Procedure: RENAL ANGIOGRAPHY;  Surgeon: Adine Hoof, MD;  Location: [facility] INVASIVE CV LAB;  Service: Cardiovascular;  Laterality: N/A;   RENAL ANGIOGRAPHY N/A 04/04/2023   Procedure: RENAL ANGIOGRAPHY;  Surgeon: Adine Hoof, MD;  Location: [facility] INVASIVE CV LAB;  Service: Cardiovascular;  Laterality: N/A;   TONSILLECTOMY AND ADENOIDECTOMY      Current Medications: Current Meds  Medication Sig   aspirin  EC 81 MG tablet Take 81 mg by mouth daily. Swallow whole.   carvedilol  (COREG ) 25 MG tablet Take 25 mg by mouth 2 (two) times daily with a meal.   cloNIDine  (CATAPRES ) 0.1 MG tablet Please take  1 tablet in am and 1 tablet afternoon.   cloNIDine  (CATAPRES ) 0.2 MG tablet Take 0.2 mg by mouth every evening.   clopidogrel  (PLAVIX ) 75 MG tablet TAKE 1 TABLET(75 MG) BY MOUTH DAILY   doxazosin  (CARDURA ) 8 MG tablet Take 8 mg by mouth 2 (two) times daily.    furosemide  (LASIX ) 40 MG tablet Take 40 mg by mouth daily.   rosuvastatin  (CRESTOR ) 10 MG tablet Take 1 tablet (10 mg total) by mouth daily.     Allergies:   Amoxicillin, Losartan, Pseudoephedrine hcl, Hydralazine , Amlodipine , Hydrocodone, Hydrocodone-acetaminophen , and Nitrofurantoin   Social History   Socioeconomic History   Marital status: Married    Spouse name: Not on file   Number of children: 2   Years of education: Not on file   Highest education level: Not on file  Occupational History    Employer: [employer]  Tobacco Use   Smoking status: Former    Current packs/day: 0.50    Average packs/day: 0.5 packs/day for 30.0 years (15.0 ttl pk-yrs)    Types: Cigarettes   Smokeless tobacco: Never  Vaping Use   Vaping status: Never Used  Substance and Sexual Activity   Alcohol use: No   Drug use: No   Sexual activity: Never    Partners: Male    Birth control/protection: Post-menopausal  Other Topics Concern   Not on file  Social History Narrative   Not on file   Social Drivers of Health   Financial Resource Strain: Not on file  Food Insecurity: No Food Insecurity (03/21/2023)   Hunger Vital Sign    Worried About Running Out of Food in the Last Year: Never true    Ran Out of Food in the Last Year: Never true  Transportation Needs: No Transportation Needs (03/21/2023)   PRAPARE - Administrator, Civil Service (Medical): No    Lack of Transportation (Non-Medical): No  Physical Activity: Inactive (11/09/2022)   Exercise Vital Sign    Days of Exercise per Week: 0 days    Minutes of Exercise per Session: 0 min  Stress: Not on file  Social Connections: Not on file     Family History: The patient's family history includes Colon cancer (age of onset: 27) in her mother; Lung cancer (age of onset: 10) in her father.  ROS:   Please see the history of present illness.     All other systems reviewed and are negative.  EKGs/Labs/Other Studies Reviewed:          Recent Labs: 03/21/2023: B Natriuretic Peptide 815.8 03/22/2023: Platelets 315 04/04/2023: BUN 50; Creatinine, Ser 3.60; Hemoglobin 9.2; Potassium 3.2; Sodium 135   Recent Lipid Panel No results found for: CHOL, TRIG, HDL, CHOLHDL, VLDL, LDLCALC, LDLDIRECT  Physical Exam:   VS:  BP 137/80 (BP Location: Right Arm, Patient Position: Sitting, Cuff Size: Normal)   Pulse 83   Ht 5' 4 (1.626 m)   Wt 174 lb 3.2 oz (79 kg)   LMP 04/11/2009 (Within Days)   SpO2 98%   BMI 29.90 kg/m  , BMI Body mass index is 29.9 kg/m.   GENERAL:  Well appearing HEENT: Pupils equal round and reactive, fundi not visualized, oral mucosa unremarkable NECK:  No jugular venous distention, waveform within normal limits, carotid upstroke brisk and symmetric, no bruits, no thyromegaly LYMPHATICS:  No cervical adenopathy LUNGS:  Clear to auscultation bilaterally HEART:  RRR.  PMI not displaced or sustained,S1 and S2 within normal limits, no S3, no S4,  no clicks, no rubs,  no murmurs ABD:  Flat, positive bowel sounds normal in frequency in pitch, no bruits, no rebound, no guarding, no midline pulsatile mass, no hepatomegaly, no splenomegaly EXT:  2 plus pulses throughout, no edema, no cyanosis no clubbing SKIN:  No rashes no nodules NEURO:  Cranial nerves II through XII grossly intact, motor grossly intact throughout PSYCH:  Cognitively intact, oriented to person place and time   ASSESSMENT/PLAN:       Hypertension BP at goal <130/80. Continue present regimen clonidine  0.1 mg at night, carvedilol  25mg  twice a day, and doxazosin  8mg  twice a day. Discussed to monitor BP at home at least 2 hours after medications and sitting for 5-10 minutes.   Type 2 Diabetes Mellitus A1c at 6.5% confirms diabetes. Discussed Mounjaro for weight loss, A1c reduction, and renal protection. Explained potential side effects of GLP-1 medications, including nausea and constipation, and strategies to mitigate them.  Emphasized dietary management and exercise in conjunction with medication. - Discuss with Dr. Christianne Cowper or Dr. Donia Furlough about initiating Mounjaro. - Consider referral to a registered dietitian for dietary guidance. She will contact us  if interested. - Consider participation in the PREP program, she will contact us  if interested in referral.   Chronic Kidney Disease Emphasized managing diabetes and hypertension to protect renal function -Continue to follow with Dr. Christianne Cowper of nephrology  Urinary Incontinence - Proceed with pelvic floor therapy as referred by Dr. Donia Furlough.   Left renal artery stenosis S/p stenting. Follows with [facility].     Screening for Secondary Hypertension:     11/09/2022   11:07 AM  Causes  Drugs/Herbals Screened     - Comments high sodium-eats out, 2 coffees daily, no EtOH.  No NSAIDS  Renovascular HTN Screened     - Comments s/p renal artery stenting  Sleep Apnea Screened     - Comments snores.  some daytime somnolence    Relevant Labs/Studies:    Latest Ref Rng & Units 04/04/2023    6:02 AM 03/22/2023    2:39 AM 03/21/2023    6:33 AM  Basic Labs  Sodium 135 - 145 mmol/L 135  135  133   Potassium 3.5 - 5.1 mmol/L 3.2  3.7  3.5   Creatinine 0.44 - 1.00 mg/dL 9.62  9.52  8.41           Latest Ref Rng & Units 11/11/2022   10:17 AM  Renin/Aldosterone   Aldosterone 0.0 - 30.0 ng/dL 32.4   Aldos/Renin Ratio 0.0 - 30.0 1.4        Latest Ref Rng & Units 11/11/2022   10:17 AM  Metanephrines/Catecholamines   Epinephrine 0 - 62 pg/mL 46   Norepinephrine 0 - 874 pg/mL 1,298   Dopamine 0 - 48 pg/mL <30   Metanephrines 0.0 - 88.0 pg/mL 40.6   Normetanephrines  0.0 - 285.2 pg/mL 220.9           11/17/2023    2:21 PM  Renovascular   Renal Artery US  Completed Yes     Disposition:    FU with MD/APP/PharmD in 1 year   Medication Adjustments/Labs and Tests Ordered: Current medicines are reviewed at length with the patient today.  Concerns regarding medicines are  outlined above.  Orders Placed This Encounter  Procedures   EKG 12-Lead   No orders of the defined types were placed in this encounter.    Signed, Clearnce Curia, NP  12/22/2023 11:20 AM    [facility]

## 2023-12-24 ENCOUNTER — Encounter (HOSPITAL_BASED_OUTPATIENT_CLINIC_OR_DEPARTMENT_OTHER): Payer: Self-pay | Admitting: Family

## 2024-02-08 ENCOUNTER — Other Ambulatory Visit: Payer: Self-pay

## 2024-02-08 ENCOUNTER — Ambulatory Visit: Attending: Family Medicine | Admitting: Physical Therapy

## 2024-02-08 DIAGNOSIS — M6281 Muscle weakness (generalized): Secondary | ICD-10-CM | POA: Insufficient documentation

## 2024-02-08 DIAGNOSIS — R293 Abnormal posture: Secondary | ICD-10-CM | POA: Diagnosis present

## 2024-02-08 DIAGNOSIS — R279 Unspecified lack of coordination: Secondary | ICD-10-CM | POA: Diagnosis present

## 2024-02-08 NOTE — Patient Instructions (Signed)
**Note De-identified Burback Obfuscation** Types of Fiber  There are two main types of fiber:  insoluble and soluble.  Both of these types can prevent and relieve constipation and diarrhea, although some people find one or the other to be more easily digested.  This handout details information about both types of fiber. recommended 25-35 grams of fiber per day,  average 9-12 grams per meal   key is a balance between soluble and insoluble  Insoluble Fiber        Functions of Insoluble Fiber moves bulk through the intestines  controls and balances the pH (acidity) in the intestines   This type of fiber should be avoided or reduced if you have soft, frequent bowel movements or leakage      Benefits of Insoluble Fiber promotes regular bowel movement and prevents constipation  removes fecal waste through colon in less time  keeps an optimal pH in intestines to prevent microbes from producing cancer substances, therefore preventing colon cancer        Food Sources of Insoluble Fiber whole-wheat products  wheat bran "miller's bran" corn bran  flax seed or other seeds vegetables such as green beans, broccoli, cauliflower and potato skins  fruit skins and root vegetable skins  popcorn brown rice  Soluble Fiber( Types 5,6,7)       Functions of Soluble Fiber  holds water in the colon to bulk and soften the stool prolongs stomach emptying time so that sugar is released and absorbed more slowly  prevent leakage associated with soft, frequent bowel movements.        Benefits of Soluble Fiber lowers total cholesterol and LDL cholesterol (the bad cholesterol) therefore reducing the risk of heart disease  regulates blood sugar for people with diabetes       Food Sources of Soluble Fiber oat/oat bran dried beans and peas  nuts  barley  flax seed or other seeds fruits such as oranges, pears, peaches, and apples  vegetables such as carrots  psyllium husk  prunes  

## 2024-02-08 NOTE — Therapy (Signed)
 OUTPATIENT PHYSICAL THERAPY FEMALE PELVIC EVALUATION   Patient Name: Karen Jimenez MRN: [id number] DOB:1956-05-10, 68 y.o., female Today's Date: 02/08/2024  END OF SESSION:  PT End of Session - 02/08/24 1447     Visit Number 1    Date for PT Re-Evaluation 05/10/24    Authorization Type 05/10/2024    Progress Note Due on Visit 10    PT Start Time 1445    PT Stop Time 1526    PT Time Calculation (min) 41 min    Activity Tolerance Patient tolerated treatment well    Behavior During Therapy [facility] for tasks assessed/performed          Past Medical History:  Diagnosis Date   Anxiety 12/29/2017   Benign hypertension with chronic kidney disease, stage III (HCC) 02/25/2020   History of smoking    Hx of hyperlipidemia    Hyperlipidemia    Hypertension    Hyponatremia 02/13/2020   Left renal artery stenosis (HCC) 02/13/2020   Low back pain 04/18/2020   Prediabetes 01/21/2017   SVT (supraventricular tachycardia) (HCC) 01/21/2017   Vitamin D deficiency disease 01/21/2017   Past Surgical History:  Procedure Laterality Date   APPENDECTOMY     KNEE ARTHROSCOPY Right 2011, 2012   MEDIAL PARTIAL KNEE REPLACEMENT  11/2010   PERIPHERAL VASCULAR INTERVENTION  02/15/2022   Procedure: PERIPHERAL VASCULAR INTERVENTION;  Surgeon: Sheree Penne Bruckner, MD;  Location: [facility] INVASIVE CV LAB;  Service: Cardiovascular;;  LT Renal   RENAL ANGIOGRAPHY N/A 02/15/2022   Procedure: RENAL ANGIOGRAPHY;  Surgeon: Sheree Penne Bruckner, MD;  Location: [facility] INVASIVE CV LAB;  Service: Cardiovascular;  Laterality: N/A;   RENAL ANGIOGRAPHY N/A 04/04/2023   Procedure: RENAL ANGIOGRAPHY;  Surgeon: Sheree Penne Bruckner, MD;  Location: [facility] INVASIVE CV LAB;  Service: Cardiovascular;  Laterality: N/A;   TONSILLECTOMY AND ADENOIDECTOMY     Patient Active Problem List   Diagnosis Date Noted   CKD stage 3b, GFR 30-44 ml/min (HCC) 03/22/2023   Hypertensive CHF (congestive heart failure) (HCC) 03/22/2023    Diastolic congestive heart failure (HCC) 03/21/2023   Acute on chronic diastolic CHF (congestive heart failure) (HCC) 03/21/2023   Acute pulmonary edema (HCC) 03/21/2023   Acute diverticulitis 06/27/2022   Hyperkalemia 06/27/2022   Hypertension 06/27/2022   AKI (acute kidney injury) (HCC) 06/27/2022   CAD (coronary artery disease) 06/27/2022   Low back pain 04/18/2020   Weight gain 02/25/2020   Benign hypertension with chronic kidney disease, stage III (HCC) 02/25/2020   Hyponatremia 02/13/2020   Left renal artery stenosis (HCC) 02/13/2020   Persistent proteinuria 02/13/2020   Hypertensive urgency 02/04/2020   Anxiety 12/29/2017   NSTEMI (non-ST elevated myocardial infarction) (HCC) 01/21/2017   Prediabetes 01/21/2017   Pure hypercholesterolemia 01/21/2017   SVT (supraventricular tachycardia) (HCC) 01/21/2017   Vitamin D deficiency disease 01/21/2017   Paronychia of finger 01/21/2017   Knee joint replacement by other means 09/24/2013    PCP: Chrystal Lamarr RAMAN, MD   REFERRING PROVIDER: Chrystal Lamarr RAMAN, MD   REFERRING DIAG: N39.46 (ICD-10-CM) - Mixed incontinence  THERAPY DIAG:  Muscle weakness (generalized)  Unspecified lack of coordination  Abnormal posture  Rationale for Evaluation and Treatment: Rehabilitation  ONSET DATE: 8 months and worsening since this  SUBJECTIVE:  SUBJECTIVE STATEMENT: Has urinary incontinence with cleaning, grocery shopping with moving furniture or lifting anything. Does use size 6 pads daily changes depending on activity up to 3x. Does wear one at night and wakes dry. Feels like she can't go anywhere - wants to go to the beach or the pool and can't.   Fluid intake: water - varies based on the day usually 4 glasses; 2 cups of coffee daily, maybe a tea  with lunch  PAIN:  Are you having pain? No   PRECAUTIONS: None  RED FLAGS: None   WEIGHT BEARING RESTRICTIONS: No  FALLS:  Has patient fallen in last 6 months? No  OCCUPATION: retired   ACTIVITY LEVEL : low - moderate right now doing deep cleaning of room by room of her house; treadmill every other day  PLOF: Independent  PATIENT GOALS: to have no urinary incontinence and have more regular bowel movements - pt retired to take of first grandchild and very fearful about leakage with lifting and carrying baby   PERTINENT HISTORY:  Left renal artery stenosis, diverticulitis, AKI , Benign hypertension with chronic kidney disease, stage III, NSTEMI, Anxiety, SVT , Hypertension, Diastolic congestive heart failure , RENAL ANGIOGRAPHY, Rt TKA,  Sexual abuse: No  BOWEL MOVEMENT: Pain with bowel movement: No Type of bowel movement:Type (Bristol Stool Scale) 1, Frequency 3-4x weekly , and Strain yes Fully empty rectum: No Leakage: No Pads: No Fiber supplement/laxative No  URINATION: Pain with urination: No Fully empty bladder: Yes:   Stream: Strong Urgency: No Frequency: not at night; varies sometimes a little faster than 2 hours but not usually Leakage: Coughing, Sneezing, Laughing, Exercise, and Lifting Pads: Yes: 3 size pads daily nothing a night   INTERCOURSE:  Ability to have vaginal penetration Yes  Pain with intercourse: none DrynessNo Climax: not painful  Marinoff Scale: 0/3  PREGNANCY: Vaginal deliveries 2 Tearing No Episiotomy No C-section deliveries 0 Currently pregnant No  PROLAPSE: None   OBJECTIVE:  Note: Objective measures were completed at Evaluation unless otherwise noted.  DIAGNOSTIC FINDINGS:    PATIENT SURVEYS:    Urogenital Distress Inventory (UDI-6 Short Form) Score = 33/100  COGNITION: Overall cognitive status: Within functional limits for tasks assessed     SENSATION: Light touch: Appears intact  LUMBAR SPECIAL TESTS:   Single leg stance test: 6s Rt, 5s Lt with pelvic rotation mildly and need to toe touch and SI Compression/distraction test: Negative  FUNCTIONAL TESTS:  Sit up test - 1/3  GAIT: WFL  POSTURE: rounded shoulders and forward head   LUMBARAROM/PROM:  A/PROM A/PROM  eval  Flexion Limited by 25%  Extension Limited by 25%  Right lateral flexion WFL  Left lateral flexion WFL  Right rotation Limited by 25%  Left rotation Limited by 25%   (Blank rows = not tested)  LOWER EXTREMITY ROM:  Bil hamstrings and adductors limited by 25%  LOWER EXTREMITY MMT:  Bil hips grossly 3+/5 PALPATION:   General: mild tightness at bil lumbar paraspinals   Pelvic Alignment: WFL  Abdominal: mild tension in lower quadrants                External Perineal Exam: mild redness throughout vulva                             Internal Pelvic Floor: no TTP  Patient confirms identification and approves PT to assess internal pelvic floor and treatment Yes No emotional/communication barriers or cognitive  limitation. Patient is motivated to learn. Patient understands and agrees with treatment goals and plan. PT explains patient will be examined in standing, sitting, and lying down to see how their muscles and joints work. When they are ready, they will be asked to remove their underwear so PT can examine their perineum. The patient is also given the option of providing their own chaperone as one is not provided in our facility. The patient also has the right and is explained the right to defer or refuse any part of the evaluation or treatment including the internal exam. With the patient's consent, PT will use one gloved finger to gently assess the muscles of the pelvic floor, seeing how well it contracts and relaxes and if there is muscle symmetry. After, the patient will get dressed and PT and patient will discuss exam findings and plan of care. PT and patient discuss plan of care, schedule, attendance policy and  HEP activities.   PELVIC MMT:   MMT eval  Vaginal 2/5 (improved to 3/5 with reps) for 5s, 5 reps  Internal Anal Sphincter   External Anal Sphincter   Puborectalis   Diastasis Recti   (Blank rows = not tested)        TONE: Slightly decreased   PROLAPSE: Not seen in hooklying with cough  TODAY'S TREATMENT:                                                                                                                              DATE:   02/08/24 EVAL Examination completed, findings reviewed, pt educated on POC, HEP, and voiding mechanics and fiber types. Pt motivated to participate in PT and agreeable to attempt recommendations.     PATIENT EDUCATION:  Education details: [id number] Person educated: Patient Education method: Explanation, Demonstration, Tactile cues, Verbal cues, and Handouts Education comprehension: verbalized understanding, returned demonstration, verbal cues required, tactile cues required, and needs further education  HOME EXERCISE PROGRAM: [id number]  ASSESSMENT:  CLINICAL IMPRESSION: Patient is a 68 y.o. female  who was seen today for physical therapy evaluation and treatment for urinary incontinence with stressors, pt denies any leakage with urge. Pt reports worsening urinary incontinence over the last several months to now needed to wear size 6 pads during the day and needs to change 3x. Pt reports she is fearful for going to the beach or pool and has leakage with any lifting at all and is always thinking or worrying about it.  Pt found to have decreased core and hip strength with MMT and sit up test, decreased pelvic stability with single leg standing bil, fascial restrictions in lower abdomen, and decreased flexibility in spine and bil hips. Patient consented to internal pelvic floor assessment vaginally this date and found to have decreased strength, endurance, and coordination. Patient benefited from verbal cues for improved technique with pelvic floor  contractions and coordination with breathing mechanics and decreased gluteal activation. Improved strength noted with reps and cues  for breathing coordination to 3/5 from 2/5. Pt would benefit from additional PT to further address deficits.       OBJECTIVE IMPAIRMENTS: decreased cognition, decreased coordination, decreased mobility, decreased strength, increased fascial restrictions, impaired perceived functional ability, impaired flexibility, improper body mechanics, and postural dysfunction.   ACTIVITY LIMITATIONS: carrying, lifting, squatting, continence, locomotion level, and caring for others  PARTICIPATION LIMITATIONS: cleaning, shopping, community activity, and yard work  PERSONAL FACTORS: Fitness, Time since onset of injury/illness/exacerbation, and 1 comorbidity: medical history are also affecting patient's functional outcome.   REHAB POTENTIAL: Good  CLINICAL DECISION MAKING: Stable/uncomplicated  EVALUATION COMPLEXITY: Low   GOALS: Goals reviewed with patient? Yes  SHORT TERM GOALS: Target date: 03/07/24  Pt to be I with HEP for carry over and continuing recommendations for improved outcomes.   Baseline: Goal status: INITIAL  2.  Pt to be I with pressure management for body weight mechanics to decreased strain at pelvic floor for no leakage with grocery shopping.  Baseline:  Goal status: INITIAL  3.  Pt will be independent with use of squatty potty, relaxed toileting mechanics, and improved bowel movement techniques in order to increase ease of bowel movements and complete evacuation.   Baseline:  Goal status: INITIAL  4.  Pt will be independent with the knack, urge suppression technique, and double voiding in order to improve bladder habits and decrease urinary incontinence.   Baseline:  Goal status: INITIAL   LONG TERM GOALS: Target date: 05/10/24  Pt to be I with advanced HEP for carry over and continuing recommendations for improved outcomes.   Baseline:  Goal  status: INITIAL  2.  Pt will report increased bowel movements per week due to improved muscle tone and coordination with bowel movements and complete evacuation at least 75% of the time.  Baseline:  Goal status: INITIAL  3.  Pt to demonstrate improved coordination of pelvic floor and breathing mechanics with 20# squat with appropriate synergistic patterns to decrease pain and leakage at least 75% of the time for improved ability to complete a 30 minute walk without strain at pelvic floor and symptoms.    Baseline:  Goal status: INITIAL  4.  Pt to demonstrate 20# farmers carry at least 1000' to simulate carrying grandchild and their supplies without urinary incontinence.  Baseline:  Goal status: INITIAL  5.  Pt to demonstrate at least 2/3 sit up test for improved core strength to decreased strain at pelvic floor and decreased urinary incontinence with stressors for improved skin integrity.  Baseline:  Goal status: INITIAL  6.  Pt to demonstrate at least 5/5 bil hip strength for improved pelvic stability and functional squats without leakage.  Baseline:  Goal status: INITIAL  PLAN:  PT FREQUENCY: 2x/week  PT DURATION: 16 sessions  PLANNED INTERVENTIONS: 97110-Therapeutic exercises, 97530- Therapeutic activity, 97112- Neuromuscular re-education, 97535- Self Care, [id number]- Manual therapy, [phone]- Canalith repositioning, [id number]- Aquatic Therapy, [phone]- Electrical stimulation (manual), [id number]- Vasopneumatic device, [id number]- Ultrasound, [id number] (1-2 muscles), [id number] (3+ muscles)- Dry Needling, Patient/Family education, Taping, Joint mobilization, Spinal mobilization, Scar mobilization, DME instructions, Cryotherapy, Moist heat, and Biofeedback  PLAN FOR NEXT SESSION: breathing mechanics, voiding mechanics, core and hip strengthening, coordination of pelvic floor, knack, pressure management   Darryle Navy, PT, DPT 07/30/253:39 PM  [facility] [address], [address] [place], [place] [address] Phone # [phone] Fax [phone]

## 2024-03-27 ENCOUNTER — Ambulatory Visit: Attending: Family Medicine | Admitting: Physical Therapy

## 2024-03-27 DIAGNOSIS — M6281 Muscle weakness (generalized): Secondary | ICD-10-CM | POA: Insufficient documentation

## 2024-03-27 DIAGNOSIS — R293 Abnormal posture: Secondary | ICD-10-CM | POA: Diagnosis present

## 2024-03-27 DIAGNOSIS — R279 Unspecified lack of coordination: Secondary | ICD-10-CM | POA: Insufficient documentation

## 2024-03-27 NOTE — Patient Instructions (Signed)
 Gentle circles from Right hip  to left hip in a square, shouldn't be painful. Complete 2x daily (am/pm) for ~5 mins.   Balloon breathing - instead of straining, blow through an open fist when you feel like you need to strain.

## 2024-03-27 NOTE — Therapy (Signed)
 OUTPATIENT PHYSICAL THERAPY FEMALE PELVIC TREATMENT   Patient Name: Karen Jimenez MRN: [id number] DOB:[birth year], 68 y.o., female Today's Date: 03/27/2024  END OF SESSION:  PT End of Session - 03/27/24 1149     Visit Number 2    Date for PT Re-Evaluation 05/10/24    Authorization Type 05/10/2024    Progress Note Due on Visit 10    PT Start Time 1148    PT Stop Time 1226    PT Time Calculation (min) 38 min    Activity Tolerance Patient tolerated treatment well    Behavior During Therapy [facility] for tasks assessed/performed          Past Medical History:  Diagnosis Date   Anxiety 12/29/2017   Benign hypertension with chronic kidney disease, stage III (HCC) 02/25/2020   History of smoking    Hx of hyperlipidemia    Hyperlipidemia    Hypertension    Hyponatremia 02/13/2020   Left renal artery stenosis (HCC) 02/13/2020   Low back pain 04/18/2020   Prediabetes 01/21/2017   SVT (supraventricular tachycardia) (HCC) 01/21/2017   Vitamin D deficiency disease 01/21/2017   Past Surgical History:  Procedure Laterality Date   APPENDECTOMY     KNEE ARTHROSCOPY Right 2011, 2012   MEDIAL PARTIAL KNEE REPLACEMENT  11/2010   PERIPHERAL VASCULAR INTERVENTION  02/15/2022   Procedure: PERIPHERAL VASCULAR INTERVENTION;  Surgeon: Sheree Penne Bruckner, MD;  Location: [facility] INVASIVE CV LAB;  Service: Cardiovascular;;  LT Renal   RENAL ANGIOGRAPHY N/A 02/15/2022   Procedure: RENAL ANGIOGRAPHY;  Surgeon: Sheree Penne Bruckner, MD;  Location: [facility] INVASIVE CV LAB;  Service: Cardiovascular;  Laterality: N/A;   RENAL ANGIOGRAPHY N/A 04/04/2023   Procedure: RENAL ANGIOGRAPHY;  Surgeon: Sheree Penne Bruckner, MD;  Location: [facility] INVASIVE CV LAB;  Service: Cardiovascular;  Laterality: N/A;   TONSILLECTOMY AND ADENOIDECTOMY     Patient Active Problem List   Diagnosis Date Noted   CKD stage 3b, GFR 30-44 ml/min (HCC) 03/22/2023   Hypertensive CHF (congestive heart failure) (HCC) 03/22/2023    Diastolic congestive heart failure (HCC) 03/21/2023   Acute on chronic diastolic CHF (congestive heart failure) (HCC) 03/21/2023   Acute pulmonary edema (HCC) 03/21/2023   Acute diverticulitis 06/27/2022   Hyperkalemia 06/27/2022   Hypertension 06/27/2022   AKI (acute kidney injury) (HCC) 06/27/2022   CAD (coronary artery disease) 06/27/2022   Low back pain 04/18/2020   Weight gain 02/25/2020   Benign hypertension with chronic kidney disease, stage III (HCC) 02/25/2020   Hyponatremia 02/13/2020   Left renal artery stenosis (HCC) 02/13/2020   Persistent proteinuria 02/13/2020   Hypertensive urgency 02/04/2020   Anxiety 12/29/2017   NSTEMI (non-ST elevated myocardial infarction) (HCC) 01/21/2017   Prediabetes 01/21/2017   Pure hypercholesterolemia 01/21/2017   SVT (supraventricular tachycardia) (HCC) 01/21/2017   Vitamin D deficiency disease 01/21/2017   Paronychia of finger 01/21/2017   Knee joint replacement by other means 09/24/2013    PCP: Chrystal Lamarr RAMAN, MD   REFERRING PROVIDER: Chrystal Lamarr RAMAN, MD   REFERRING DIAG: N39.46 (ICD-10-CM) - Mixed incontinence  THERAPY DIAG:  Muscle weakness (generalized)  Unspecified lack of coordination  Abnormal posture  Rationale for Evaluation and Treatment: Rehabilitation  ONSET DATE: 8 months and worsening since this  SUBJECTIVE:  SUBJECTIVE STATEMENT: Has no leakage at night, stopped wearing pad at night. Still wearing size 6 pads but this is just because its what she is.  Notes usually dampness on pads however expect while at [employer] and by the time home pad is saturated. Even on treadmill not having a lot. Seems less leakage overall. Not changing pads 1x daily.   Did get step stool for bowel movement notable decrease in straining and  more emptying but not always.   Fluid intake: water - varies based on the day usually 4 glasses; 2 cups of coffee daily, maybe a tea with lunch  PAIN:  Are you having pain? No   PRECAUTIONS: None  RED FLAGS: None   WEIGHT BEARING RESTRICTIONS: No  FALLS:  Has patient fallen in last 6 months? No  OCCUPATION: retired   ACTIVITY LEVEL : low - moderate right now doing deep cleaning of room by room of her house; treadmill every other day  PLOF: Independent  PATIENT GOALS: to have no urinary incontinence and have more regular bowel movements - pt retired to take of first grandchild and very fearful about leakage with lifting and carrying baby   PERTINENT HISTORY:  Left renal artery stenosis, diverticulitis, AKI , Benign hypertension with chronic kidney disease, stage III, NSTEMI, Anxiety, SVT , Hypertension, Diastolic congestive heart failure , RENAL ANGIOGRAPHY, Rt TKA,  Sexual abuse: No  BOWEL MOVEMENT: Pain with bowel movement: No Type of bowel movement:Type (Bristol Stool Scale) 1, Frequency 3-4x weekly , and Strain yes Fully empty rectum: No Leakage: No Pads: No Fiber supplement/laxative No  URINATION: Pain with urination: No Fully empty bladder: Yes:   Stream: Strong Urgency: No Frequency: not at night; varies sometimes a little faster than 2 hours but not usually Leakage: Coughing, Sneezing, Laughing, Exercise, and Lifting Pads: Yes: 3 size pads daily nothing a night   INTERCOURSE:  Ability to have vaginal penetration Yes  Pain with intercourse: none DrynessNo Climax: not painful  Marinoff Scale: 0/3  PREGNANCY: Vaginal deliveries 2 Tearing No Episiotomy No C-section deliveries 0 Currently pregnant No  PROLAPSE: None   OBJECTIVE:  Note: Objective measures were completed at Evaluation unless otherwise noted.  DIAGNOSTIC FINDINGS:    PATIENT SURVEYS:    Urogenital Distress Inventory (UDI-6 Short Form) Score = 33/100  COGNITION: Overall  cognitive status: Within functional limits for tasks assessed     SENSATION: Light touch: Appears intact  LUMBAR SPECIAL TESTS:  Single leg stance test: 6s Rt, 5s Lt with pelvic rotation mildly and need to toe touch and SI Compression/distraction test: Negative  FUNCTIONAL TESTS:  Sit up test - 1/3  GAIT: WFL  POSTURE: rounded shoulders and forward head   LUMBARAROM/PROM:  A/PROM A/PROM  eval  Flexion Limited by 25%  Extension Limited by 25%  Right lateral flexion WFL  Left lateral flexion WFL  Right rotation Limited by 25%  Left rotation Limited by 25%   (Blank rows = not tested)  LOWER EXTREMITY ROM:  Bil hamstrings and adductors limited by 25%  LOWER EXTREMITY MMT:  Bil hips grossly 3+/5 PALPATION:   General: mild tightness at bil lumbar paraspinals   Pelvic Alignment: WFL  Abdominal: mild tension in lower quadrants                External Perineal Exam: mild redness throughout vulva  Internal Pelvic Floor: no TTP  Patient confirms identification and approves PT to assess internal pelvic floor and treatment Yes No emotional/communication barriers or cognitive limitation. Patient is motivated to learn. Patient understands and agrees with treatment goals and plan. PT explains patient will be examined in standing, sitting, and lying down to see how their muscles and joints work. When they are ready, they will be asked to remove their underwear so PT can examine their perineum. The patient is also given the option of providing their own chaperone as one is not provided in our facility. The patient also has the right and is explained the right to defer or refuse any part of the evaluation or treatment including the internal exam. With the patient's consent, PT will use one gloved finger to gently assess the muscles of the pelvic floor, seeing how well it contracts and relaxes and if there is muscle symmetry. After, the patient will get dressed  and PT and patient will discuss exam findings and plan of care. PT and patient discuss plan of care, schedule, attendance policy and HEP activities.   PELVIC MMT:   MMT eval  Vaginal 2/5 (improved to 3/5 with reps) for 5s, 5 reps  Internal Anal Sphincter   External Anal Sphincter   Puborectalis   Diastasis Recti   (Blank rows = not tested)        TONE: Slightly decreased   PROLAPSE: Not seen in hooklying with cough  TODAY'S TREATMENT:                                                                                                                              DATE:   02/08/24 EVAL Examination completed, findings reviewed, pt educated on POC, HEP, and voiding mechanics and fiber types. Pt motivated to participate in PT and agreeable to attempt recommendations.    03/27/24: Pt educated on abdominal massage and balloon breathing for decreased need of straining and improved peristalsis - handouts given, PT also completed massage on pt to improve carry over. Pt returned demonstration Hooklying green band hip abduction 2x10 + exhale + pelvic floor contraction Hooklying green band hip abduction 2x10 + exhale + pelvic floor contraction Hooklying bil hip adduction with exhale and pelvic floor contraction 2x10   PATIENT EDUCATION:  Education details: [id number] Person educated: Patient Education method: Explanation, Demonstration, Tactile cues, Verbal cues, and Handouts Education comprehension: verbalized understanding, returned demonstration, verbal cues required, tactile cues required, and needs further education  HOME EXERCISE PROGRAM: [id number]  ASSESSMENT:  CLINICAL IMPRESSION: Patient is a 68 y.o. female  who was seen today for physical therapy treatment for urinary incontinence with stressors, pt denies any leakage with urge. Pt has had improvement since eval, decreasing pad changes from 3 to 1 daily and no pads at night. Pt does see large amounts of urinary incontinence with [employer]  trips with lots of lifting. Session focused on coordination of pelvic floor and breathing and with activity  to decreased stress urinary incontinence. Pt tolerated well, does need max cues for this but able to correct. Updated HEP and educated pt on voiding mechanics for bowels as she is still needing to strain and not fully emptying. Pt would benefit from additional PT to further address deficits.       OBJECTIVE IMPAIRMENTS: decreased cognition, decreased coordination, decreased mobility, decreased strength, increased fascial restrictions, impaired perceived functional ability, impaired flexibility, improper body mechanics, and postural dysfunction.   ACTIVITY LIMITATIONS: carrying, lifting, squatting, continence, locomotion level, and caring for others  PARTICIPATION LIMITATIONS: cleaning, shopping, community activity, and yard work  PERSONAL FACTORS: Fitness, Time since onset of injury/illness/exacerbation, and 1 comorbidity: medical history are also affecting patient's functional outcome.   REHAB POTENTIAL: Good  CLINICAL DECISION MAKING: Stable/uncomplicated  EVALUATION COMPLEXITY: Low   GOALS: Goals reviewed with patient? Yes  SHORT TERM GOALS: Target date: 03/07/24  Pt to be I with HEP for carry over and continuing recommendations for improved outcomes.   Baseline: Goal status: INITIAL  2.  Pt to be I with pressure management for body weight mechanics to decreased strain at pelvic floor for no leakage with grocery shopping.  Baseline:  Goal status: INITIAL  3.  Pt will be independent with use of squatty potty, relaxed toileting mechanics, and improved bowel movement techniques in order to increase ease of bowel movements and complete evacuation.   Baseline:  Goal status: INITIAL  4.  Pt will be independent with the knack, urge suppression technique, and double voiding in order to improve bladder habits and decrease urinary incontinence.   Baseline:  Goal status:  INITIAL   LONG TERM GOALS: Target date: 05/10/24  Pt to be I with advanced HEP for carry over and continuing recommendations for improved outcomes.   Baseline:  Goal status: INITIAL  2.  Pt will report increased bowel movements per week due to improved muscle tone and coordination with bowel movements and complete evacuation at least 75% of the time.  Baseline:  Goal status: INITIAL  3.  Pt to demonstrate improved coordination of pelvic floor and breathing mechanics with 20# squat with appropriate synergistic patterns to decrease pain and leakage at least 75% of the time for improved ability to complete a 30 minute walk without strain at pelvic floor and symptoms.    Baseline:  Goal status: INITIAL  4.  Pt to demonstrate 20# farmers carry at least 1000' to simulate carrying grandchild and their supplies without urinary incontinence.  Baseline:  Goal status: INITIAL  5.  Pt to demonstrate at least 2/3 sit up test for improved core strength to decreased strain at pelvic floor and decreased urinary incontinence with stressors for improved skin integrity.  Baseline:  Goal status: INITIAL  6.  Pt to demonstrate at least 5/5 bil hip strength for improved pelvic stability and functional squats without leakage.  Baseline:  Goal status: INITIAL  PLAN:  PT FREQUENCY: 2x/week  PT DURATION: 16 sessions  PLANNED INTERVENTIONS: 97110-Therapeutic exercises, 97530- Therapeutic activity, 97112- Neuromuscular re-education, 97535- Self Care, [id number]- Manual therapy, [phone]- Canalith repositioning, [id number]- Aquatic Therapy, [phone]- Electrical stimulation (manual), [id number]- Vasopneumatic device, [id number]- Ultrasound, [id number] (1-2 muscles), [id number] (3+ muscles)- Dry Needling, Patient/Family education, Taping, Joint mobilization, Spinal mobilization, Scar mobilization, DME instructions, Cryotherapy, Moist heat, and Biofeedback  PLAN FOR NEXT SESSION:  core and hip strengthening, coordination of pelvic floor,   pressure management   Darryle Navy, PT, DPT 03/28/2511:47 PM  [facility] [address] [place],  [place] [address] Phone # [phone] Fax [phone]

## 2024-03-29 ENCOUNTER — Ambulatory Visit: Admitting: Physical Therapy

## 2024-03-29 DIAGNOSIS — R293 Abnormal posture: Secondary | ICD-10-CM

## 2024-03-29 DIAGNOSIS — M6281 Muscle weakness (generalized): Secondary | ICD-10-CM

## 2024-03-29 DIAGNOSIS — R279 Unspecified lack of coordination: Secondary | ICD-10-CM

## 2024-03-29 NOTE — Therapy (Signed)
 OUTPATIENT PHYSICAL THERAPY FEMALE PELVIC TREATMENT   Patient Name: Karen Jimenez MRN: [id number] DOB:1956-03-21, 68 y.o., female Today's Date: 03/29/2024  END OF SESSION:  PT End of Session - 03/29/24 1445     Visit Number 3    Date for Recertification  05/10/24    Authorization Type 05/10/2024    Progress Note Due on Visit 10    PT Start Time 1446    PT Stop Time 1526    PT Time Calculation (min) 40 min    Activity Tolerance Patient tolerated treatment well    Behavior During Therapy [facility] for tasks assessed/performed          Past Medical History:  Diagnosis Date   Anxiety 12/29/2017   Benign hypertension with chronic kidney disease, stage III (HCC) 02/25/2020   History of smoking    Hx of hyperlipidemia    Hyperlipidemia    Hypertension    Hyponatremia 02/13/2020   Left renal artery stenosis (HCC) 02/13/2020   Low back pain 04/18/2020   Prediabetes 01/21/2017   SVT (supraventricular tachycardia) (HCC) 01/21/2017   Vitamin D deficiency disease 01/21/2017   Past Surgical History:  Procedure Laterality Date   APPENDECTOMY     KNEE ARTHROSCOPY Right 2011, 2012   MEDIAL PARTIAL KNEE REPLACEMENT  11/2010   PERIPHERAL VASCULAR INTERVENTION  02/15/2022   Procedure: PERIPHERAL VASCULAR INTERVENTION;  Surgeon: Sheree Penne Bruckner, MD;  Location: [facility] INVASIVE CV LAB;  Service: Cardiovascular;;  LT Renal   RENAL ANGIOGRAPHY N/A 02/15/2022   Procedure: RENAL ANGIOGRAPHY;  Surgeon: Sheree Penne Bruckner, MD;  Location: [facility] INVASIVE CV LAB;  Service: Cardiovascular;  Laterality: N/A;   RENAL ANGIOGRAPHY N/A 04/04/2023   Procedure: RENAL ANGIOGRAPHY;  Surgeon: Sheree Penne Bruckner, MD;  Location: [facility] INVASIVE CV LAB;  Service: Cardiovascular;  Laterality: N/A;   TONSILLECTOMY AND ADENOIDECTOMY     Patient Active Problem List   Diagnosis Date Noted   CKD stage 3b, GFR 30-44 ml/min (HCC) 03/22/2023   Hypertensive CHF (congestive heart failure) (HCC) 03/22/2023    Diastolic congestive heart failure (HCC) 03/21/2023   Acute on chronic diastolic CHF (congestive heart failure) (HCC) 03/21/2023   Acute pulmonary edema (HCC) 03/21/2023   Acute diverticulitis 06/27/2022   Hyperkalemia 06/27/2022   Hypertension 06/27/2022   AKI (acute kidney injury) (HCC) 06/27/2022   CAD (coronary artery disease) 06/27/2022   Low back pain 04/18/2020   Weight gain 02/25/2020   Benign hypertension with chronic kidney disease, stage III (HCC) 02/25/2020   Hyponatremia 02/13/2020   Left renal artery stenosis (HCC) 02/13/2020   Persistent proteinuria 02/13/2020   Hypertensive urgency 02/04/2020   Anxiety 12/29/2017   NSTEMI (non-ST elevated myocardial infarction) (HCC) 01/21/2017   Prediabetes 01/21/2017   Pure hypercholesterolemia 01/21/2017   SVT (supraventricular tachycardia) (HCC) 01/21/2017   Vitamin D deficiency disease 01/21/2017   Paronychia of finger 01/21/2017   Knee joint replacement by other means 09/24/2013    PCP: Chrystal Lamarr RAMAN, MD   REFERRING PROVIDER: Chrystal Lamarr RAMAN, MD   REFERRING DIAG: N39.46 (ICD-10-CM) - Mixed incontinence  THERAPY DIAG:  Muscle weakness (generalized)  Unspecified lack of coordination  Abnormal posture  Rationale for Evaluation and Treatment: Rehabilitation  ONSET DATE: 8 months and worsening since this  SUBJECTIVE:  SUBJECTIVE STATEMENT: Has continued to have improvement with urinary incontinence. Has not had no leakage today and cleaned the house.   Fluid intake: water - varies based on the day usually 4 glasses; 2 cups of coffee daily, maybe a tea with lunch  PAIN:  Are you having pain? No   PRECAUTIONS: None  RED FLAGS: None   WEIGHT BEARING RESTRICTIONS: No  FALLS:  Has patient fallen in last 6 months?  No  OCCUPATION: retired   ACTIVITY LEVEL : low - moderate right now doing deep cleaning of room by room of her house; treadmill every other day  PLOF: Independent  PATIENT GOALS: to have no urinary incontinence and have more regular bowel movements - pt retired to take of first grandchild and very fearful about leakage with lifting and carrying baby   PERTINENT HISTORY:  Left renal artery stenosis, diverticulitis, AKI , Benign hypertension with chronic kidney disease, stage III, NSTEMI, Anxiety, SVT , Hypertension, Diastolic congestive heart failure , RENAL ANGIOGRAPHY, Rt TKA,  Sexual abuse: No  BOWEL MOVEMENT: Pain with bowel movement: No Type of bowel movement:Type (Bristol Stool Scale) 1, Frequency 3-4x weekly , and Strain yes Fully empty rectum: No Leakage: No Pads: No Fiber supplement/laxative No  URINATION: Pain with urination: No Fully empty bladder: Yes:   Stream: Strong Urgency: No Frequency: not at night; varies sometimes a little faster than 2 hours but not usually Leakage: Coughing, Sneezing, Laughing, Exercise, and Lifting Pads: Yes: 3 size pads daily nothing a night   INTERCOURSE:  Ability to have vaginal penetration Yes  Pain with intercourse: none DrynessNo Climax: not painful  Marinoff Scale: 0/3  PREGNANCY: Vaginal deliveries 2 Tearing No Episiotomy No C-section deliveries 0 Currently pregnant No  PROLAPSE: None   OBJECTIVE:  Note: Objective measures were completed at Evaluation unless otherwise noted.  DIAGNOSTIC FINDINGS:    PATIENT SURVEYS:    Urogenital Distress Inventory (UDI-6 Short Form) Score = 33/100  COGNITION: Overall cognitive status: Within functional limits for tasks assessed     SENSATION: Light touch: Appears intact  LUMBAR SPECIAL TESTS:  Single leg stance test: 6s Rt, 5s Lt with pelvic rotation mildly and need to toe touch and SI Compression/distraction test: Negative  FUNCTIONAL TESTS:  Sit up test -  1/3  GAIT: WFL  POSTURE: rounded shoulders and forward head   LUMBARAROM/PROM:  A/PROM A/PROM  eval  Flexion Limited by 25%  Extension Limited by 25%  Right lateral flexion WFL  Left lateral flexion WFL  Right rotation Limited by 25%  Left rotation Limited by 25%   (Blank rows = not tested)  LOWER EXTREMITY ROM:  Bil hamstrings and adductors limited by 25%  LOWER EXTREMITY MMT:  Bil hips grossly 3+/5 PALPATION:   General: mild tightness at bil lumbar paraspinals   Pelvic Alignment: WFL  Abdominal: mild tension in lower quadrants                External Perineal Exam: mild redness throughout vulva                             Internal Pelvic Floor: no TTP  Patient confirms identification and approves PT to assess internal pelvic floor and treatment Yes No emotional/communication barriers or cognitive limitation. Patient is motivated to learn. Patient understands and agrees with treatment goals and plan. PT explains patient will be examined in standing, sitting, and lying down to see how their muscles and joints  work. When they are ready, they will be asked to remove their underwear so PT can examine their perineum. The patient is also given the option of providing their own chaperone as one is not provided in our facility. The patient also has the right and is explained the right to defer or refuse any part of the evaluation or treatment including the internal exam. With the patient's consent, PT will use one gloved finger to gently assess the muscles of the pelvic floor, seeing how well it contracts and relaxes and if there is muscle symmetry. After, the patient will get dressed and PT and patient will discuss exam findings and plan of care. PT and patient discuss plan of care, schedule, attendance policy and HEP activities.   PELVIC MMT:   MMT eval  Vaginal 2/5 (improved to 3/5 with reps) for 5s, 5 reps  Internal Anal Sphincter   External Anal Sphincter   Puborectalis    Diastasis Recti   (Blank rows = not tested)        TONE: Slightly decreased   PROLAPSE: Not seen in hooklying with cough  TODAY'S TREATMENT:                                                                                                                              DATE:   02/08/24 EVAL Examination completed, findings reviewed, pt educated on POC, HEP, and voiding mechanics and fiber types. Pt motivated to participate in PT and agreeable to attempt recommendations.    03/27/24: Pt educated on abdominal massage and balloon breathing for decreased need of straining and improved peristalsis - handouts given, PT also completed massage on pt to improve carry over. Pt returned demonstration Hooklying green band hip abduction 2x10 + exhale + pelvic floor contraction Hooklying green band hip abduction 2x10 + exhale + pelvic floor contraction Hooklying bil hip adduction with exhale and pelvic floor contraction 2x10  03/29/24: Hooklying blue band hip abduction with exhale and pelvic floor 2x10 Sidelying hip abduction with ball press 2x10 blue band  Bridges 2x10 with exhale and pelvic floor activation Ball squeezes 2x10 with exhale and pelvic floor activation Sit to stands 8# 2x10 with exhale and pelvic floor activation  PATIENT EDUCATION:  Education details: [id number] Person educated: Patient Education method: Explanation, Demonstration, Tactile cues, Verbal cues, and Handouts Education comprehension: verbalized understanding, returned demonstration, verbal cues required, tactile cues required, and needs further education  HOME EXERCISE PROGRAM: [id number]  ASSESSMENT:  CLINICAL IMPRESSION: Patient is a 68 y.o. female  who was seen today for physical therapy treatment for urinary incontinence with stressors, pt denies any leakage with urge. Pt has had continued improvement, complaint with HEP and recommendations. Updated HEP today and pt agreeable to 1x weekly appointments as she has had  good improvement and she requests to space them out. No leakage or pain during session, pt does continue to need cues for coordination. Pt would benefit from additional PT to further  address deficits.       OBJECTIVE IMPAIRMENTS: decreased cognition, decreased coordination, decreased mobility, decreased strength, increased fascial restrictions, impaired perceived functional ability, impaired flexibility, improper body mechanics, and postural dysfunction.   ACTIVITY LIMITATIONS: carrying, lifting, squatting, continence, locomotion level, and caring for others  PARTICIPATION LIMITATIONS: cleaning, shopping, community activity, and yard work  PERSONAL FACTORS: Fitness, Time since onset of injury/illness/exacerbation, and 1 comorbidity: medical history are also affecting patient's functional outcome.   REHAB POTENTIAL: Good  CLINICAL DECISION MAKING: Stable/uncomplicated  EVALUATION COMPLEXITY: Low   GOALS: Goals reviewed with patient? Yes  SHORT TERM GOALS: Target date: 03/07/24  Pt to be I with HEP for carry over and continuing recommendations for improved outcomes.   Baseline: Goal status: INITIAL  2.  Pt to be I with pressure management for body weight mechanics to decreased strain at pelvic floor for no leakage with grocery shopping.  Baseline:  Goal status: INITIAL  3.  Pt will be independent with use of squatty potty, relaxed toileting mechanics, and improved bowel movement techniques in order to increase ease of bowel movements and complete evacuation.   Baseline:  Goal status: INITIAL  4.  Pt will be independent with the knack, urge suppression technique, and double voiding in order to improve bladder habits and decrease urinary incontinence.   Baseline:  Goal status: INITIAL   LONG TERM GOALS: Target date: 05/10/24  Pt to be I with advanced HEP for carry over and continuing recommendations for improved outcomes.   Baseline:  Goal status: INITIAL  2.  Pt will report  increased bowel movements per week due to improved muscle tone and coordination with bowel movements and complete evacuation at least 75% of the time.  Baseline:  Goal status: INITIAL  3.  Pt to demonstrate improved coordination of pelvic floor and breathing mechanics with 20# squat with appropriate synergistic patterns to decrease pain and leakage at least 75% of the time for improved ability to complete a 30 minute walk without strain at pelvic floor and symptoms.    Baseline:  Goal status: INITIAL  4.  Pt to demonstrate 20# farmers carry at least 1000' to simulate carrying grandchild and their supplies without urinary incontinence.  Baseline:  Goal status: INITIAL  5.  Pt to demonstrate at least 2/3 sit up test for improved core strength to decreased strain at pelvic floor and decreased urinary incontinence with stressors for improved skin integrity.  Baseline:  Goal status: INITIAL  6.  Pt to demonstrate at least 5/5 bil hip strength for improved pelvic stability and functional squats without leakage.  Baseline:  Goal status: INITIAL  PLAN:  PT FREQUENCY: 2x/week  PT DURATION: 16 sessions  PLANNED INTERVENTIONS: 97110-Therapeutic exercises, 97530- Therapeutic activity, 97112- Neuromuscular re-education, 97535- Self Care, [id number]- Manual therapy, [phone]- Canalith repositioning, [id number]- Aquatic Therapy, [phone]- Electrical stimulation (manual), [id number]- Vasopneumatic device, [id number]- Ultrasound, 79439 (1-2 muscles), 20561 (3+ muscles)- Dry Needling, Patient/Family education, Taping, Joint mobilization, Spinal mobilization, Scar mobilization, DME instructions, Cryotherapy, Moist heat, and Biofeedback  PLAN FOR NEXT SESSION:  core and hip strengthening, coordination of pelvic floor,  pressure management   Darryle Navy, PT, DPT 09/18/254:04 PM  [facility] [address] [place], [place] [address] Phone # [phone] Fax [phone]

## 2024-04-02 ENCOUNTER — Ambulatory Visit: Admitting: Physical Therapy

## 2024-04-02 DIAGNOSIS — M6281 Muscle weakness (generalized): Secondary | ICD-10-CM | POA: Diagnosis not present

## 2024-04-02 DIAGNOSIS — R279 Unspecified lack of coordination: Secondary | ICD-10-CM

## 2024-04-02 DIAGNOSIS — R293 Abnormal posture: Secondary | ICD-10-CM

## 2024-04-02 NOTE — Therapy (Signed)
 OUTPATIENT PHYSICAL THERAPY FEMALE PELVIC TREATMENT   Patient Name: Karen Jimenez MRN: [id number] DOB:[birth year], 68 y.o., female Today's Date: 04/02/2024  END OF SESSION:  PT End of Session - 04/02/24 1451     Visit Number 4    Date for Recertification  05/10/24    Authorization Type 05/10/2024    Progress Note Due on Visit 10    PT Start Time 1447    PT Stop Time 1526    PT Time Calculation (min) 39 min    Activity Tolerance Patient tolerated treatment well    Behavior During Therapy [facility] for tasks assessed/performed          Past Medical History:  Diagnosis Date   Anxiety 12/29/2017   Benign hypertension with chronic kidney disease, stage III (HCC) 02/25/2020   History of smoking    Hx of hyperlipidemia    Hyperlipidemia    Hypertension    Hyponatremia 02/13/2020   Left renal artery stenosis (HCC) 02/13/2020   Low back pain 04/18/2020   Prediabetes 01/21/2017   SVT (supraventricular tachycardia) 01/21/2017   Vitamin D deficiency disease 01/21/2017   Past Surgical History:  Procedure Laterality Date   APPENDECTOMY     KNEE ARTHROSCOPY Right 2011, 2012   MEDIAL PARTIAL KNEE REPLACEMENT  11/2010   PERIPHERAL VASCULAR INTERVENTION  02/15/2022   Procedure: PERIPHERAL VASCULAR INTERVENTION;  Surgeon: Sheree Penne Bruckner, MD;  Location: [facility] INVASIVE CV LAB;  Service: Cardiovascular;;  LT Renal   RENAL ANGIOGRAPHY N/A 02/15/2022   Procedure: RENAL ANGIOGRAPHY;  Surgeon: Sheree Penne Bruckner, MD;  Location: [facility] INVASIVE CV LAB;  Service: Cardiovascular;  Laterality: N/A;   RENAL ANGIOGRAPHY N/A 04/04/2023   Procedure: RENAL ANGIOGRAPHY;  Surgeon: Sheree Penne Bruckner, MD;  Location: [facility] INVASIVE CV LAB;  Service: Cardiovascular;  Laterality: N/A;   TONSILLECTOMY AND ADENOIDECTOMY     Patient Active Problem List   Diagnosis Date Noted   CKD stage 3b, GFR 30-44 ml/min (HCC) 03/22/2023   Hypertensive CHF (congestive heart failure) (HCC) 03/22/2023   Diastolic  congestive heart failure (HCC) 03/21/2023   Acute on chronic diastolic CHF (congestive heart failure) (HCC) 03/21/2023   Acute pulmonary edema (HCC) 03/21/2023   Acute diverticulitis 06/27/2022   Hyperkalemia 06/27/2022   Hypertension 06/27/2022   AKI (acute kidney injury) (HCC) 06/27/2022   CAD (coronary artery disease) 06/27/2022   Low back pain 04/18/2020   Weight gain 02/25/2020   Benign hypertension with chronic kidney disease, stage III (HCC) 02/25/2020   Hyponatremia 02/13/2020   Left renal artery stenosis (HCC) 02/13/2020   Persistent proteinuria 02/13/2020   Hypertensive urgency 02/04/2020   Anxiety 12/29/2017   NSTEMI (non-ST elevated myocardial infarction) (HCC) 01/21/2017   Prediabetes 01/21/2017   Pure hypercholesterolemia 01/21/2017   SVT (supraventricular tachycardia) 01/21/2017   Vitamin D deficiency disease 01/21/2017   Paronychia of finger 01/21/2017   Knee joint replacement by other means 09/24/2013    PCP: Chrystal Lamarr RAMAN, MD   REFERRING PROVIDER: Chrystal Lamarr RAMAN, MD   REFERRING DIAG: N39.46 (ICD-10-CM) - Mixed incontinence  THERAPY DIAG:  Muscle weakness (generalized)  Unspecified lack of coordination  Abnormal posture  Rationale for Evaluation and Treatment: Rehabilitation  ONSET DATE: 8 months and worsening since this  SUBJECTIVE:  SUBJECTIVE STATEMENT: Still doing well, decreased to size 1 pad. Only one used per day.    Fluid intake: water - varies based on the day usually 4 glasses; 2 cups of coffee daily, maybe a tea with lunch  PAIN:  Are you having pain? No   PRECAUTIONS: None  RED FLAGS: None   WEIGHT BEARING RESTRICTIONS: No  FALLS:  Has patient fallen in last 6 months? No  OCCUPATION: retired   ACTIVITY LEVEL : low - moderate  right now doing deep cleaning of room by room of her house; treadmill every other day  PLOF: Independent  PATIENT GOALS: to have no urinary incontinence and have more regular bowel movements - pt retired to take of first grandchild and very fearful about leakage with lifting and carrying baby   PERTINENT HISTORY:  Left renal artery stenosis, diverticulitis, AKI , Benign hypertension with chronic kidney disease, stage III, NSTEMI, Anxiety, SVT , Hypertension, Diastolic congestive heart failure , RENAL ANGIOGRAPHY, Rt TKA,  Sexual abuse: No  BOWEL MOVEMENT: Pain with bowel movement: No Type of bowel movement:Type (Bristol Stool Scale) 1, Frequency 3-4x weekly , and Strain yes Fully empty rectum: No Leakage: No Pads: No Fiber supplement/laxative No  URINATION: Pain with urination: No Fully empty bladder: Yes:   Stream: Strong Urgency: No Frequency: not at night; varies sometimes a little faster than 2 hours but not usually Leakage: Coughing, Sneezing, Laughing, Exercise, and Lifting Pads: Yes: 3 size pads 6 daily nothing a night   INTERCOURSE:  Ability to have vaginal penetration Yes  Pain with intercourse: none DrynessNo Climax: not painful  Marinoff Scale: 0/3  PREGNANCY: Vaginal deliveries 2 Tearing No Episiotomy No C-section deliveries 0 Currently pregnant No  PROLAPSE: None   OBJECTIVE:  Note: Objective measures were completed at Evaluation unless otherwise noted.  DIAGNOSTIC FINDINGS:    PATIENT SURVEYS:    Urogenital Distress Inventory (UDI-6 Short Form) Score = 33/100  COGNITION: Overall cognitive status: Within functional limits for tasks assessed     SENSATION: Light touch: Appears intact  LUMBAR SPECIAL TESTS:  Single leg stance test: 6s Rt, 5s Lt with pelvic rotation mildly and need to toe touch and SI Compression/distraction test: Negative  FUNCTIONAL TESTS:  Sit up test - 1/3  GAIT: WFL  POSTURE: rounded shoulders and forward  head   LUMBARAROM/PROM:  A/PROM A/PROM  eval  Flexion Limited by 25%  Extension Limited by 25%  Right lateral flexion WFL  Left lateral flexion WFL  Right rotation Limited by 25%  Left rotation Limited by 25%   (Blank rows = not tested)  LOWER EXTREMITY ROM:  Bil hamstrings and adductors limited by 25%  LOWER EXTREMITY MMT:  Bil hips grossly 3+/5 PALPATION:   General: mild tightness at bil lumbar paraspinals   Pelvic Alignment: WFL  Abdominal: mild tension in lower quadrants                External Perineal Exam: mild redness throughout vulva                             Internal Pelvic Floor: no TTP  Patient confirms identification and approves PT to assess internal pelvic floor and treatment Yes No emotional/communication barriers or cognitive limitation. Patient is motivated to learn. Patient understands and agrees with treatment goals and plan. PT explains patient will be examined in standing, sitting, and lying down to see how their muscles and joints work. When they  are ready, they will be asked to remove their underwear so PT can examine their perineum. The patient is also given the option of providing their own chaperone as one is not provided in our facility. The patient also has the right and is explained the right to defer or refuse any part of the evaluation or treatment including the internal exam. With the patient's consent, PT will use one gloved finger to gently assess the muscles of the pelvic floor, seeing how well it contracts and relaxes and if there is muscle symmetry. After, the patient will get dressed and PT and patient will discuss exam findings and plan of care. PT and patient discuss plan of care, schedule, attendance policy and HEP activities.   PELVIC MMT:   MMT eval  Vaginal 2/5 (improved to 3/5 with reps) for 5s, 5 reps  Internal Anal Sphincter   External Anal Sphincter   Puborectalis   Diastasis Recti   (Blank rows = not tested)         TONE: Slightly decreased   PROLAPSE: Not seen in hooklying with cough  TODAY'S TREATMENT:                                                                                                                              DATE:   03/27/24: Pt educated on abdominal massage and balloon breathing for decreased need of straining and improved peristalsis - handouts given, PT also completed massage on pt to improve carry over. Pt returned demonstration Hooklying green band hip abduction 2x10 + exhale + pelvic floor contraction Hooklying green band hip abduction 2x10 + exhale + pelvic floor contraction Hooklying bil hip adduction with exhale and pelvic floor contraction 2x10  03/29/24: Hooklying blue band hip abduction with exhale and pelvic floor 2x10 Sidelying hip abduction with ball press 2x10 blue band  Bridges 2x10 with exhale and pelvic floor activation Ball squeezes 2x10 with exhale and pelvic floor activation Sit to stands 8# 2x10 with exhale and pelvic floor activation  04/02/24: NMRE: all exercises for exhale and pelvic floor contraction with activity for improved coordination of muscle activation and decreased stress at pelvic floor for decreased leakage.   Bridges 2x10 Sidelying hip abduction with ball press blue band 2x10  Seated green band bil shoulder horizontal abduction 2x10 Sit to stands 8# 2x10 Box pick ups empty x5 + pelvic floor contraction and exhale> x10 6# x5 >9# x10 Reviewed lifting mechanics and pressure management for more functional tasks at home like grocery shopping, cleaning etc to decreased remaining leakage with tasks.    PATIENT EDUCATION:  Education details: [id number] Person educated: Patient Education method: Explanation, Demonstration, Tactile cues, Verbal cues, and Handouts Education comprehension: verbalized understanding, returned demonstration, verbal cues required, tactile cues required, and needs further education  HOME EXERCISE  PROGRAM: [id number]  ASSESSMENT:  CLINICAL IMPRESSION: Patient is a 68 y.o. female  who was seen today for physical therapy treatment for urinary incontinence with  stressors, pt denies any leakage with urge. Pt reports she continues to see improvement, plans to go to [employer] to test lift more lifting. Does benefit from cues for coordination of breathing and pelvic floor. No leakage or pain during session, pt does continue to need cues for coordination. Pt would benefit from additional PT to further address deficits.       OBJECTIVE IMPAIRMENTS: decreased cognition, decreased coordination, decreased mobility, decreased strength, increased fascial restrictions, impaired perceived functional ability, impaired flexibility, improper body mechanics, and postural dysfunction.   ACTIVITY LIMITATIONS: carrying, lifting, squatting, continence, locomotion level, and caring for others  PARTICIPATION LIMITATIONS: cleaning, shopping, community activity, and yard work  PERSONAL FACTORS: Fitness, Time since onset of injury/illness/exacerbation, and 1 comorbidity: medical history are also affecting patient's functional outcome.   REHAB POTENTIAL: Good  CLINICAL DECISION MAKING: Stable/uncomplicated  EVALUATION COMPLEXITY: Low   GOALS: Goals reviewed with patient? Yes  SHORT TERM GOALS: Target date: 03/07/24  Pt to be I with HEP for carry over and continuing recommendations for improved outcomes.   Baseline: Goal status: INITIAL  2.  Pt to be I with pressure management for body weight mechanics to decreased strain at pelvic floor for no leakage with grocery shopping.  Baseline:  Goal status: INITIAL  3.  Pt will be independent with use of squatty potty, relaxed toileting mechanics, and improved bowel movement techniques in order to increase ease of bowel movements and complete evacuation.   Baseline:  Goal status: INITIAL  4.  Pt will be independent with the knack, urge suppression technique, and  double voiding in order to improve bladder habits and decrease urinary incontinence.   Baseline:  Goal status: INITIAL   LONG TERM GOALS: Target date: 05/10/24  Pt to be I with advanced HEP for carry over and continuing recommendations for improved outcomes.   Baseline:  Goal status: INITIAL  2.  Pt will report increased bowel movements per week due to improved muscle tone and coordination with bowel movements and complete evacuation at least 75% of the time.  Baseline:  Goal status: INITIAL  3.  Pt to demonstrate improved coordination of pelvic floor and breathing mechanics with 20# squat with appropriate synergistic patterns to decrease pain and leakage at least 75% of the time for improved ability to complete a 30 minute walk without strain at pelvic floor and symptoms.    Baseline:  Goal status: INITIAL  4.  Pt to demonstrate 20# farmers carry at least 1000' to simulate carrying grandchild and their supplies without urinary incontinence.  Baseline:  Goal status: INITIAL  5.  Pt to demonstrate at least 2/3 sit up test for improved core strength to decreased strain at pelvic floor and decreased urinary incontinence with stressors for improved skin integrity.  Baseline:  Goal status: INITIAL  6.  Pt to demonstrate at least 5/5 bil hip strength for improved pelvic stability and functional squats without leakage.  Baseline:  Goal status: INITIAL  PLAN:  PT FREQUENCY: 2x/week  PT DURATION: 16 sessions  PLANNED INTERVENTIONS: 97110-Therapeutic exercises, 97530- Therapeutic activity, [id number]- Neuromuscular re-education, 97535- Self Care, [id number]- Manual therapy, [phone]- Canalith repositioning, [id number]- Aquatic Therapy, [id number]- Electrical stimulation (manual), [id number]- Vasopneumatic device, [id number]- Ultrasound, 79439 (1-2 muscles), 20561 (3+ muscles)- Dry Needling, Patient/Family education, Taping, Joint mobilization, Spinal mobilization, Scar mobilization, DME instructions, Cryotherapy, Moist  heat, and Biofeedback  PLAN FOR NEXT SESSION:  core and hip strengthening, coordination of pelvic floor,  pressure management   Darryle Navy, PT, DPT 09/22/253:33 PM  [facility] [address] [place], [place] [address] Phone # [phone] Fax [phone]

## 2024-04-04 ENCOUNTER — Ambulatory Visit: Admitting: Physical Therapy

## 2024-04-10 ENCOUNTER — Ambulatory Visit: Admitting: Physical Therapy

## 2024-04-10 DIAGNOSIS — R279 Unspecified lack of coordination: Secondary | ICD-10-CM

## 2024-04-10 DIAGNOSIS — R293 Abnormal posture: Secondary | ICD-10-CM

## 2024-04-10 DIAGNOSIS — M6281 Muscle weakness (generalized): Secondary | ICD-10-CM | POA: Diagnosis not present

## 2024-04-10 NOTE — Therapy (Signed)
 OUTPATIENT PHYSICAL THERAPY FEMALE PELVIC TREATMENT   Patient Name: Karen Jimenez MRN: [id number] DOB:Sep 30, 1955, 67 y.o., female Today's Date: 04/10/2024  END OF SESSION:  PT End of Session - 04/10/24 1019     Visit Number 5    Date for Recertification  05/10/24    Authorization Type 05/10/2024    Progress Note Due on Visit 10    PT Start Time 1016    PT Stop Time 1055    PT Time Calculation (min) 39 min    Activity Tolerance Patient tolerated treatment well    Behavior During Therapy [facility] for tasks assessed/performed          Past Medical History:  Diagnosis Date   Anxiety 12/29/2017   Benign hypertension with chronic kidney disease, stage III (HCC) 02/25/2020   History of smoking    Hx of hyperlipidemia    Hyperlipidemia    Hypertension    Hyponatremia 02/13/2020   Left renal artery stenosis 02/13/2020   Low back pain 04/18/2020   Prediabetes 01/21/2017   SVT (supraventricular tachycardia) 01/21/2017   Vitamin D deficiency disease 01/21/2017   Past Surgical History:  Procedure Laterality Date   APPENDECTOMY     KNEE ARTHROSCOPY Right 2011, 2012   MEDIAL PARTIAL KNEE REPLACEMENT  11/2010   PERIPHERAL VASCULAR INTERVENTION  02/15/2022   Procedure: PERIPHERAL VASCULAR INTERVENTION;  Surgeon: Sheree Penne Bruckner, MD;  Location: [facility] INVASIVE CV LAB;  Service: Cardiovascular;;  LT Renal   RENAL ANGIOGRAPHY N/A 02/15/2022   Procedure: RENAL ANGIOGRAPHY;  Surgeon: Sheree Penne Bruckner, MD;  Location: [facility] INVASIVE CV LAB;  Service: Cardiovascular;  Laterality: N/A;   RENAL ANGIOGRAPHY N/A 04/04/2023   Procedure: RENAL ANGIOGRAPHY;  Surgeon: Sheree Penne Bruckner, MD;  Location: [facility] INVASIVE CV LAB;  Service: Cardiovascular;  Laterality: N/A;   TONSILLECTOMY AND ADENOIDECTOMY     Patient Active Problem List   Diagnosis Date Noted   CKD stage 3b, GFR 30-44 ml/min (HCC) 03/22/2023   Hypertensive CHF (congestive heart failure) (HCC) 03/22/2023   Diastolic  congestive heart failure (HCC) 03/21/2023   Acute on chronic diastolic CHF (congestive heart failure) (HCC) 03/21/2023   Acute pulmonary edema (HCC) 03/21/2023   Acute diverticulitis 06/27/2022   Hyperkalemia 06/27/2022   Hypertension 06/27/2022   AKI (acute kidney injury) 06/27/2022   CAD (coronary artery disease) 06/27/2022   Low back pain 04/18/2020   Weight gain 02/25/2020   Benign hypertension with chronic kidney disease, stage III (HCC) 02/25/2020   Hyponatremia 02/13/2020   Left renal artery stenosis 02/13/2020   Persistent proteinuria 02/13/2020   Hypertensive urgency 02/04/2020   Anxiety 12/29/2017   NSTEMI (non-ST elevated myocardial infarction) (HCC) 01/21/2017   Prediabetes 01/21/2017   Pure hypercholesterolemia 01/21/2017   SVT (supraventricular tachycardia) 01/21/2017   Vitamin D deficiency disease 01/21/2017   Paronychia of finger 01/21/2017   Knee joint replacement by other means 09/24/2013    PCP: Chrystal Lamarr RAMAN, MD   REFERRING PROVIDER: Chrystal Lamarr RAMAN, MD   REFERRING DIAG: N39.46 (ICD-10-CM) - Mixed incontinence  THERAPY DIAG:  Muscle weakness (generalized)  Unspecified lack of coordination  Abnormal posture  Rationale for Evaluation and Treatment: Rehabilitation  ONSET DATE: 8 months and worsening since this  SUBJECTIVE:  SUBJECTIVE STATEMENT: Continues to improve had a less busy weekend. Still only needing one liner and did a workout this morning.   Fluid intake: water - varies based on the day usually 4 glasses; 2 cups of coffee daily, maybe a tea with lunch  PAIN:  Are you having pain? No   PRECAUTIONS: None  RED FLAGS: None   WEIGHT BEARING RESTRICTIONS: No  FALLS:  Has patient fallen in last 6 months? No  OCCUPATION: retired    ACTIVITY LEVEL : low - moderate right now doing deep cleaning of room by room of her house; treadmill every other day  PLOF: Independent  PATIENT GOALS: to have no urinary incontinence and have more regular bowel movements - pt retired to take of first grandchild and very fearful about leakage with lifting and carrying baby   PERTINENT HISTORY:  Left renal artery stenosis, diverticulitis, AKI , Benign hypertension with chronic kidney disease, stage III, NSTEMI, Anxiety, SVT , Hypertension, Diastolic congestive heart failure , RENAL ANGIOGRAPHY, Rt TKA,  Sexual abuse: No  BOWEL MOVEMENT: Pain with bowel movement: No Type of bowel movement:Type (Bristol Stool Scale) 1, Frequency 3-4x weekly , and Strain yes Fully empty rectum: No Leakage: No Pads: No Fiber supplement/laxative No  URINATION: Pain with urination: No Fully empty bladder: Yes:   Stream: Strong Urgency: No Frequency: not at night; varies sometimes a little faster than 2 hours but not usually Leakage: Coughing, Sneezing, Laughing, Exercise, and Lifting Pads: Yes: 3 size pads 6 daily nothing a night   INTERCOURSE:  Ability to have vaginal penetration Yes  Pain with intercourse: none DrynessNo Climax: not painful  Marinoff Scale: 0/3  PREGNANCY: Vaginal deliveries 2 Tearing No Episiotomy No C-section deliveries 0 Currently pregnant No  PROLAPSE: None   OBJECTIVE:  Note: Objective measures were completed at Evaluation unless otherwise noted.  DIAGNOSTIC FINDINGS:    PATIENT SURVEYS:    Urogenital Distress Inventory (UDI-6 Short Form) Score = 33/100  COGNITION: Overall cognitive status: Within functional limits for tasks assessed     SENSATION: Light touch: Appears intact  LUMBAR SPECIAL TESTS:  Single leg stance test: 6s Rt, 5s Lt with pelvic rotation mildly and need to toe touch and SI Compression/distraction test: Negative  FUNCTIONAL TESTS:  Sit up test - 1/3  GAIT: WFL  POSTURE:  rounded shoulders and forward head   LUMBARAROM/PROM:  A/PROM A/PROM  eval  Flexion Limited by 25%  Extension Limited by 25%  Right lateral flexion WFL  Left lateral flexion WFL  Right rotation Limited by 25%  Left rotation Limited by 25%   (Blank rows = not tested)  LOWER EXTREMITY ROM:  Bil hamstrings and adductors limited by 25%  LOWER EXTREMITY MMT:  Bil hips grossly 3+/5 PALPATION:   General: mild tightness at bil lumbar paraspinals   Pelvic Alignment: WFL  Abdominal: mild tension in lower quadrants                External Perineal Exam: mild redness throughout vulva                             Internal Pelvic Floor: no TTP  Patient confirms identification and approves PT to assess internal pelvic floor and treatment Yes No emotional/communication barriers or cognitive limitation. Patient is motivated to learn. Patient understands and agrees with treatment goals and plan. PT explains patient will be examined in standing, sitting, and lying down to see how their muscles  and joints work. When they are ready, they will be asked to remove their underwear so PT can examine their perineum. The patient is also given the option of providing their own chaperone as one is not provided in our facility. The patient also has the right and is explained the right to defer or refuse any part of the evaluation or treatment including the internal exam. With the patient's consent, PT will use one gloved finger to gently assess the muscles of the pelvic floor, seeing how well it contracts and relaxes and if there is muscle symmetry. After, the patient will get dressed and PT and patient will discuss exam findings and plan of care. PT and patient discuss plan of care, schedule, attendance policy and HEP activities.   PELVIC MMT:   MMT eval  Vaginal 2/5 (improved to 3/5 with reps) for 5s, 5 reps  Internal Anal Sphincter   External Anal Sphincter   Puborectalis   Diastasis Recti   (Blank  rows = not tested)        TONE: Slightly decreased   PROLAPSE: Not seen in hooklying with cough  TODAY'S TREATMENT:                                                                                                                              DATE:   03/29/24: Hooklying blue band hip abduction with exhale and pelvic floor 2x10 Sidelying hip abduction with ball press 2x10 blue band  Bridges 2x10 with exhale and pelvic floor activation Ball squeezes 2x10 with exhale and pelvic floor activation Sit to stands 8# 2x10 with exhale and pelvic floor activation  04/02/24: NMRE: all exercises for exhale and pelvic floor contraction with activity for improved coordination of muscle activation and decreased stress at pelvic floor for decreased leakage.   Bridges 2x10 Sidelying hip abduction with ball press blue band 2x10  Seated green band bil shoulder horizontal abduction 2x10 Sit to stands 8# 2x10 Box pick ups empty x5 + pelvic floor contraction and exhale> x10 6# x5 >9# x10 Reviewed lifting mechanics and pressure management for more functional tasks at home like grocery shopping, cleaning etc to decreased remaining leakage with tasks.   04/10/24: Hooklying hip abduction blue band + pelvic floor contraction and exhale 2x10 Opposite hand/knee ball blue band 2x10 Sidelying ball press with hip abduction blue band 2x10 each + pelvic floor contraction and exhale Sit to stands 10# from mat table + pelvic floor activation + exhale  Squats 10# x10 + pelvic floor contractions + exhale 15# sit to stands +pelvic floor contractions +exhale (leakage on first rep then none) Farmer's carry 15# 1000' each hand   PATIENT EDUCATION:  Education details: [id number] Person educated: Patient Education method: Explanation, Demonstration, Tactile cues, Verbal cues, and Handouts Education comprehension: verbalized understanding, returned demonstration, verbal cues required, tactile cues required, and needs further  education  HOME EXERCISE PROGRAM: [id number]  ASSESSMENT:  CLINICAL IMPRESSION: Patient is a 68  y.o. female  who was seen today for physical therapy treatment for urinary incontinence with stressors, pt denies any leakage with urge. Pt reports she continues to see improvement. Tolerated increased challenge of exercises well today, did have one instance of leakage with lifting 15# however no further leakage with cues for coordination. Pt would benefit from additional PT to further address deficits.       OBJECTIVE IMPAIRMENTS: decreased cognition, decreased coordination, decreased mobility, decreased strength, increased fascial restrictions, impaired perceived functional ability, impaired flexibility, improper body mechanics, and postural dysfunction.   ACTIVITY LIMITATIONS: carrying, lifting, squatting, continence, locomotion level, and caring for others  PARTICIPATION LIMITATIONS: cleaning, shopping, community activity, and yard work  PERSONAL FACTORS: Fitness, Time since onset of injury/illness/exacerbation, and 1 comorbidity: medical history are also affecting patient's functional outcome.   REHAB POTENTIAL: Good  CLINICAL DECISION MAKING: Stable/uncomplicated  EVALUATION COMPLEXITY: Low   GOALS: Goals reviewed with patient? Yes  SHORT TERM GOALS: Target date: 03/07/24  Pt to be I with HEP for carry over and continuing recommendations for improved outcomes.   Baseline: Goal status: INITIAL  2.  Pt to be I with pressure management for body weight mechanics to decreased strain at pelvic floor for no leakage with grocery shopping.  Baseline:  Goal status: INITIAL  3.  Pt will be independent with use of squatty potty, relaxed toileting mechanics, and improved bowel movement techniques in order to increase ease of bowel movements and complete evacuation.   Baseline:  Goal status: INITIAL  4.  Pt will be independent with the knack, urge suppression technique, and double voiding in  order to improve bladder habits and decrease urinary incontinence.   Baseline:  Goal status: INITIAL   LONG TERM GOALS: Target date: 05/10/24  Pt to be I with advanced HEP for carry over and continuing recommendations for improved outcomes.   Baseline:  Goal status: INITIAL  2.  Pt will report increased bowel movements per week due to improved muscle tone and coordination with bowel movements and complete evacuation at least 75% of the time.  Baseline:  Goal status: INITIAL  3.  Pt to demonstrate improved coordination of pelvic floor and breathing mechanics with 20# squat with appropriate synergistic patterns to decrease pain and leakage at least 75% of the time for improved ability to complete a 30 minute walk without strain at pelvic floor and symptoms.    Baseline:  Goal status: INITIAL  4.  Pt to demonstrate 20# farmers carry at least 1000' to simulate carrying grandchild and their supplies without urinary incontinence.  Baseline:  Goal status: INITIAL  5.  Pt to demonstrate at least 2/3 sit up test for improved core strength to decreased strain at pelvic floor and decreased urinary incontinence with stressors for improved skin integrity.  Baseline:  Goal status: INITIAL  6.  Pt to demonstrate at least 5/5 bil hip strength for improved pelvic stability and functional squats without leakage.  Baseline:  Goal status: INITIAL  PLAN:  PT FREQUENCY: 2x/week  PT DURATION: 16 sessions  PLANNED INTERVENTIONS: 97110-Therapeutic exercises, 97530- Therapeutic activity, [id number]- Neuromuscular re-education, 97535- Self Care, [id number]- Manual therapy, [phone]- Canalith repositioning, [id number]- Aquatic Therapy, [phone]- Electrical stimulation (manual), [id number]- Vasopneumatic device, [id number]- Ultrasound, [id number] (1-2 muscles), [id number] (3+ muscles)- Dry Needling, Patient/Family education, Taping, Joint mobilization, Spinal mobilization, Scar mobilization, DME instructions, Cryotherapy, Moist heat, and  Biofeedback  PLAN FOR NEXT SESSION:  core and hip strengthening, coordination of pelvic floor,  pressure management   Darryle Navy, PT,  DPT 04/10/2510:45 AM  [facility] [address], [address] [place], [place] [address] Phone # [phone] Fax [phone]

## 2024-04-12 ENCOUNTER — Encounter: Admitting: Physical Therapy

## 2024-04-14 ENCOUNTER — Other Ambulatory Visit: Payer: Self-pay | Admitting: Vascular Surgery

## 2024-04-16 NOTE — Telephone Encounter (Signed)
 Patient will get future refills from PCP or other appropriate provider.  Patient is aware.

## 2024-04-17 ENCOUNTER — Ambulatory Visit: Attending: Family Medicine | Admitting: Physical Therapy

## 2024-04-17 DIAGNOSIS — R279 Unspecified lack of coordination: Secondary | ICD-10-CM | POA: Diagnosis present

## 2024-04-17 DIAGNOSIS — M6281 Muscle weakness (generalized): Secondary | ICD-10-CM | POA: Diagnosis present

## 2024-04-17 DIAGNOSIS — R293 Abnormal posture: Secondary | ICD-10-CM | POA: Insufficient documentation

## 2024-04-17 NOTE — Therapy (Signed)
 OUTPATIENT PHYSICAL THERAPY FEMALE PELVIC TREATMENT   Patient Name: Karen Jimenez MRN: [id number] DOB:[birth year], 68 y.o., female Today's Date: 04/17/2024  END OF SESSION:  PT End of Session - 04/17/24 1155     Visit Number 6    Date for Recertification  05/10/24    Authorization Type 05/10/2024    Progress Note Due on Visit 10    PT Start Time 1146    PT Stop Time 1226    PT Time Calculation (min) 40 min    Activity Tolerance Patient tolerated treatment well    Behavior During Therapy [facility] for tasks assessed/performed           Past Medical History:  Diagnosis Date   Anxiety 12/29/2017   Benign hypertension with chronic kidney disease, stage III (HCC) 02/25/2020   History of smoking    Hx of hyperlipidemia    Hyperlipidemia    Hypertension    Hyponatremia 02/13/2020   Left renal artery stenosis 02/13/2020   Low back pain 04/18/2020   Prediabetes 01/21/2017   SVT (supraventricular tachycardia) 01/21/2017   Vitamin D deficiency disease 01/21/2017   Past Surgical History:  Procedure Laterality Date   APPENDECTOMY     KNEE ARTHROSCOPY Right 2011, 2012   MEDIAL PARTIAL KNEE REPLACEMENT  11/2010   PERIPHERAL VASCULAR INTERVENTION  02/15/2022   Procedure: PERIPHERAL VASCULAR INTERVENTION;  Surgeon: Sheree Penne Bruckner, MD;  Location: [facility] INVASIVE CV LAB;  Service: Cardiovascular;;  LT Renal   RENAL ANGIOGRAPHY N/A 02/15/2022   Procedure: RENAL ANGIOGRAPHY;  Surgeon: Sheree Penne Bruckner, MD;  Location: [facility] INVASIVE CV LAB;  Service: Cardiovascular;  Laterality: N/A;   RENAL ANGIOGRAPHY N/A 04/04/2023   Procedure: RENAL ANGIOGRAPHY;  Surgeon: Sheree Penne Bruckner, MD;  Location: [facility] INVASIVE CV LAB;  Service: Cardiovascular;  Laterality: N/A;   TONSILLECTOMY AND ADENOIDECTOMY     Patient Active Problem List   Diagnosis Date Noted   CKD stage 3b, GFR 30-44 ml/min (HCC) 03/22/2023   Hypertensive CHF (congestive heart failure) (HCC) 03/22/2023   Diastolic  congestive heart failure (HCC) 03/21/2023   Acute on chronic diastolic CHF (congestive heart failure) (HCC) 03/21/2023   Acute pulmonary edema (HCC) 03/21/2023   Acute diverticulitis 06/27/2022   Hyperkalemia 06/27/2022   Hypertension 06/27/2022   AKI (acute kidney injury) 06/27/2022   CAD (coronary artery disease) 06/27/2022   Low back pain 04/18/2020   Weight gain 02/25/2020   Benign hypertension with chronic kidney disease, stage III (HCC) 02/25/2020   Hyponatremia 02/13/2020   Left renal artery stenosis 02/13/2020   Persistent proteinuria 02/13/2020   Hypertensive urgency 02/04/2020   Anxiety 12/29/2017   NSTEMI (non-ST elevated myocardial infarction) (HCC) 01/21/2017   Prediabetes 01/21/2017   Pure hypercholesterolemia 01/21/2017   SVT (supraventricular tachycardia) 01/21/2017   Vitamin D deficiency disease 01/21/2017   Paronychia of finger 01/21/2017   Knee joint replacement by other means 09/24/2013    PCP: Chrystal Lamarr RAMAN, MD   REFERRING PROVIDER: Chrystal Lamarr RAMAN, MD   REFERRING DIAG: N39.46 (ICD-10-CM) - Mixed incontinence  THERAPY DIAG:  Muscle weakness (generalized)  Unspecified lack of coordination  Abnormal posture  Rationale for Evaluation and Treatment: Rehabilitation  ONSET DATE: 8 months and worsening since this  SUBJECTIVE:  SUBJECTIVE STATEMENT: Reports she has been doing a lot better.   Fluid intake: water - varies based on the day usually 4 glasses; 2 cups of coffee daily, maybe a tea with lunch  PAIN:  Are you having pain? No   PRECAUTIONS: None  RED FLAGS: None   WEIGHT BEARING RESTRICTIONS: No  FALLS:  Has patient fallen in last 6 months? No  OCCUPATION: retired   ACTIVITY LEVEL : low - moderate right now doing deep cleaning of room by  room of her house; treadmill every other day  PLOF: Independent  PATIENT GOALS: to have no urinary incontinence and have more regular bowel movements - pt retired to take of first grandchild and very fearful about leakage with lifting and carrying baby   PERTINENT HISTORY:  Left renal artery stenosis, diverticulitis, AKI , Benign hypertension with chronic kidney disease, stage III, NSTEMI, Anxiety, SVT , Hypertension, Diastolic congestive heart failure , RENAL ANGIOGRAPHY, Rt TKA,  Sexual abuse: No  BOWEL MOVEMENT: Pain with bowel movement: No Type of bowel movement:Type (Bristol Stool Scale) 1, Frequency 3-4x weekly , and Strain yes Fully empty rectum: No Leakage: No Pads: No Fiber supplement/laxative No  URINATION: Pain with urination: No Fully empty bladder: Yes:   Stream: Strong Urgency: No Frequency: not at night; varies sometimes a little faster than 2 hours but not usually Leakage: Coughing, Sneezing, Laughing, Exercise, and Lifting Pads: Yes: 3 size pads 6 daily nothing a night   INTERCOURSE:  Ability to have vaginal penetration Yes  Pain with intercourse: none DrynessNo Climax: not painful  Marinoff Scale: 0/3  PREGNANCY: Vaginal deliveries 2 Tearing No Episiotomy No C-section deliveries 0 Currently pregnant No  PROLAPSE: None   OBJECTIVE:  Note: Objective measures were completed at Evaluation unless otherwise noted.  DIAGNOSTIC FINDINGS:    PATIENT SURVEYS:    Urogenital Distress Inventory (UDI-6 Short Form) Score = 33/100  COGNITION: Overall cognitive status: Within functional limits for tasks assessed     SENSATION: Light touch: Appears intact  LUMBAR SPECIAL TESTS:  Single leg stance test: 6s Rt, 5s Lt with pelvic rotation mildly and need to toe touch and SI Compression/distraction test: Negative  FUNCTIONAL TESTS:  Sit up test - 1/3  GAIT: WFL  POSTURE: rounded shoulders and forward head   LUMBARAROM/PROM:  A/PROM A/PROM  eval   Flexion Limited by 25%  Extension Limited by 25%  Right lateral flexion WFL  Left lateral flexion WFL  Right rotation Limited by 25%  Left rotation Limited by 25%   (Blank rows = not tested)  LOWER EXTREMITY ROM:  Bil hamstrings and adductors limited by 25%  LOWER EXTREMITY MMT:  Bil hips grossly 3+/5 PALPATION:   General: mild tightness at bil lumbar paraspinals   Pelvic Alignment: WFL  Abdominal: mild tension in lower quadrants                External Perineal Exam: mild redness throughout vulva                             Internal Pelvic Floor: no TTP  Patient confirms identification and approves PT to assess internal pelvic floor and treatment Yes No emotional/communication barriers or cognitive limitation. Patient is motivated to learn. Patient understands and agrees with treatment goals and plan. PT explains patient will be examined in standing, sitting, and lying down to see how their muscles and joints work. When they are ready, they will be asked  to remove their underwear so PT can examine their perineum. The patient is also given the option of providing their own chaperone as one is not provided in our facility. The patient also has the right and is explained the right to defer or refuse any part of the evaluation or treatment including the internal exam. With the patient's consent, PT will use one gloved finger to gently assess the muscles of the pelvic floor, seeing how well it contracts and relaxes and if there is muscle symmetry. After, the patient will get dressed and PT and patient will discuss exam findings and plan of care. PT and patient discuss plan of care, schedule, attendance policy and HEP activities.   PELVIC MMT:   MMT eval  Vaginal 2/5 (improved to 3/5 with reps) for 5s, 5 reps  Internal Anal Sphincter   External Anal Sphincter   Puborectalis   Diastasis Recti   (Blank rows = not tested)        TONE: Slightly decreased   PROLAPSE: Not seen in  hooklying with cough  TODAY'S TREATMENT:                                                                                                                              DATE:   04/02/24: NMRE: all exercises for exhale and pelvic floor contraction with activity for improved coordination of muscle activation and decreased stress at pelvic floor for decreased leakage.   Bridges 2x10 Sidelying hip abduction with ball press blue band 2x10  Seated green band bil shoulder horizontal abduction 2x10 Sit to stands 8# 2x10 Box pick ups empty x5 + pelvic floor contraction and exhale> x10 6# x5 >9# x10 Reviewed lifting mechanics and pressure management for more functional tasks at home like grocery shopping, cleaning etc to decreased remaining leakage with tasks.   04/10/24: Hooklying hip abduction blue band + pelvic floor contraction and exhale 2x10 Opposite hand/knee ball blue band 2x10 Sidelying ball press with hip abduction blue band 2x10 each + pelvic floor contraction and exhale Sit to stands 10# from mat table + pelvic floor activation + exhale  Squats 10# x10 + pelvic floor contractions + exhale 15# sit to stands +pelvic floor contractions +exhale (leakage on first rep then none) Farmer's carry 15# 1000' each hand  04/17/24: Kallie with green  band hip abduction 2x10 with exhale and pelvic floor activation Bridges with alt marching green band 2x10 Bird dogs x10 each  Sit to stands x10 10# with pelvic floor activations with exhale Sit to stands x10 13# with pelvic floor activations with exhale  Box carry 20# 1000' 6# over head hold walking 50' each Nu step 5 mins L5 with cues for pelvic floor activation 3x10 intermittent throughout this for continued pelvic floor activation with mobility.   PATIENT EDUCATION:  Education details: [id number] Person educated: Patient Education method: Explanation, Demonstration, Tactile cues, Verbal cues, and Handouts Education comprehension: verbalized  understanding, returned demonstration, verbal cues  required, tactile cues required, and needs further education  HOME EXERCISE PROGRAM: [id number]  ASSESSMENT:  CLINICAL IMPRESSION: Patient is a 68 y.o. female  who was seen today for physical therapy treatment for urinary incontinence with stressors, pt denies any leakage with urge. Pt reports she continues to see improvement. Tolerated increased challenge of exercises well today. Pt would benefit from additional PT to further address deficits.       OBJECTIVE IMPAIRMENTS: decreased cognition, decreased coordination, decreased mobility, decreased strength, increased fascial restrictions, impaired perceived functional ability, impaired flexibility, improper body mechanics, and postural dysfunction.   ACTIVITY LIMITATIONS: carrying, lifting, squatting, continence, locomotion level, and caring for others  PARTICIPATION LIMITATIONS: cleaning, shopping, community activity, and yard work  PERSONAL FACTORS: Fitness, Time since onset of injury/illness/exacerbation, and 1 comorbidity: medical history are also affecting patient's functional outcome.   REHAB POTENTIAL: Good  CLINICAL DECISION MAKING: Stable/uncomplicated  EVALUATION COMPLEXITY: Low   GOALS: Goals reviewed with patient? Yes  SHORT TERM GOALS: Target date: 03/07/24  Pt to be I with HEP for carry over and continuing recommendations for improved outcomes.   Baseline: Goal status: MET  2.  Pt to be I with pressure management for body weight mechanics to decreased strain at pelvic floor for no leakage with grocery shopping.  Baseline:  Goal status: MET  3.  Pt will be independent with use of squatty potty, relaxed toileting mechanics, and improved bowel movement techniques in order to increase ease of bowel movements and complete evacuation.   Baseline:  Goal status: MET  4.  Pt will be independent with the knack, urge suppression technique, and double voiding in order to  improve bladder habits and decrease urinary incontinence.   Baseline:  Goal status: MET   LONG TERM GOALS: Target date: 05/10/24  Pt to be I with advanced HEP for carry over and continuing recommendations for improved outcomes.   Baseline:  Goal status: MET  2.  Pt will report increased bowel movements per week due to improved muscle tone and coordination with bowel movements and complete evacuation at least 75% of the time.  Baseline:  Goal status: on going  3.  Pt to demonstrate improved coordination of pelvic floor and breathing mechanics with 20# squat with appropriate synergistic patterns to decrease pain and leakage at least 75% of the time for improved ability to complete a 30 minute walk without strain at pelvic floor and symptoms.    Baseline:  Goal status: on going  4.  Pt to demonstrate 20# farmers carry at least 1000' to simulate carrying grandchild and their supplies without urinary incontinence.  Baseline:  Goal status: on going  5.  Pt to demonstrate at least 2/3 sit up test for improved core strength to decreased strain at pelvic floor and decreased urinary incontinence with stressors for improved skin integrity.  Baseline:  Goal status: on going  6.  Pt to demonstrate at least 5/5 bil hip strength for improved pelvic stability and functional squats without leakage.  Baseline:  Goal status: on going  PLAN:  PT FREQUENCY: 2x/week  PT DURATION: 16 sessions  PLANNED INTERVENTIONS: 97110-Therapeutic exercises, 97530- Therapeutic activity, [id number]- Neuromuscular re-education, 97535- Self Care, [id number]- Manual therapy, [phone]- Canalith repositioning, [id number]- Aquatic Therapy, [phone]- Electrical stimulation (manual), [id number]- Vasopneumatic device, [id number]- Ultrasound, [id number] (1-2 muscles), [id number] (3+ muscles)- Dry Needling, Patient/Family education, Taping, Joint mobilization, Spinal mobilization, Scar mobilization, DME instructions, Cryotherapy, Moist heat, and Biofeedback  PLAN  FOR NEXT SESSION:  core and hip strengthening, coordination of  pelvic floor,  pressure management   Darryle Navy, PT, DPT 04/18/2511:44 PM  [facility] [address] [place], [place] [address] Phone # [phone] Fax [phone]

## 2024-04-19 ENCOUNTER — Encounter: Admitting: Physical Therapy

## 2024-04-24 ENCOUNTER — Ambulatory Visit: Admitting: Physical Therapy

## 2024-04-24 DIAGNOSIS — M6281 Muscle weakness (generalized): Secondary | ICD-10-CM

## 2024-04-24 DIAGNOSIS — R279 Unspecified lack of coordination: Secondary | ICD-10-CM

## 2024-04-24 DIAGNOSIS — R293 Abnormal posture: Secondary | ICD-10-CM

## 2024-04-24 NOTE — Therapy (Signed)
 OUTPATIENT PHYSICAL THERAPY FEMALE PELVIC TREATMENT   Patient Name: Karen Jimenez MRN: [id number] DOB:03/08/1956, 68 y.o., female Today's Date: 04/24/2024  END OF SESSION:  PT End of Session - 04/24/24 1235     Visit Number 7    Date for Recertification  05/10/24    Authorization Type 05/10/2024    Progress Note Due on Visit 10    PT Start Time 1232    PT Stop Time 1313    PT Time Calculation (min) 41 min    Activity Tolerance Patient tolerated treatment well    Behavior During Therapy [facility] for tasks assessed/performed            Past Medical History:  Diagnosis Date   Anxiety 12/29/2017   Benign hypertension with chronic kidney disease, stage III (HCC) 02/25/2020   History of smoking    Hx of hyperlipidemia    Hyperlipidemia    Hypertension    Hyponatremia 02/13/2020   Left renal artery stenosis 02/13/2020   Low back pain 04/18/2020   Prediabetes 01/21/2017   SVT (supraventricular tachycardia) 01/21/2017   Vitamin D deficiency disease 01/21/2017   Past Surgical History:  Procedure Laterality Date   APPENDECTOMY     KNEE ARTHROSCOPY Right 2011, 2012   MEDIAL PARTIAL KNEE REPLACEMENT  11/2010   PERIPHERAL VASCULAR INTERVENTION  02/15/2022   Procedure: PERIPHERAL VASCULAR INTERVENTION;  Surgeon: Sheree Penne Bruckner, MD;  Location: [facility] INVASIVE CV LAB;  Service: Cardiovascular;;  LT Renal   RENAL ANGIOGRAPHY N/A 02/15/2022   Procedure: RENAL ANGIOGRAPHY;  Surgeon: Sheree Penne Bruckner, MD;  Location: [facility] INVASIVE CV LAB;  Service: Cardiovascular;  Laterality: N/A;   RENAL ANGIOGRAPHY N/A 04/04/2023   Procedure: RENAL ANGIOGRAPHY;  Surgeon: Sheree Penne Bruckner, MD;  Location: [facility] INVASIVE CV LAB;  Service: Cardiovascular;  Laterality: N/A;   TONSILLECTOMY AND ADENOIDECTOMY     Patient Active Problem List   Diagnosis Date Noted   CKD stage 3b, GFR 30-44 ml/min (HCC) 03/22/2023   Hypertensive CHF (congestive heart failure) (HCC) 03/22/2023   Diastolic  congestive heart failure (HCC) 03/21/2023   Acute on chronic diastolic CHF (congestive heart failure) (HCC) 03/21/2023   Acute pulmonary edema (HCC) 03/21/2023   Acute diverticulitis 06/27/2022   Hyperkalemia 06/27/2022   Hypertension 06/27/2022   AKI (acute kidney injury) 06/27/2022   CAD (coronary artery disease) 06/27/2022   Low back pain 04/18/2020   Weight gain 02/25/2020   Benign hypertension with chronic kidney disease, stage III (HCC) 02/25/2020   Hyponatremia 02/13/2020   Left renal artery stenosis 02/13/2020   Persistent proteinuria 02/13/2020   Hypertensive urgency 02/04/2020   Anxiety 12/29/2017   NSTEMI (non-ST elevated myocardial infarction) (HCC) 01/21/2017   Prediabetes 01/21/2017   Pure hypercholesterolemia 01/21/2017   SVT (supraventricular tachycardia) 01/21/2017   Vitamin D deficiency disease 01/21/2017   Paronychia of finger 01/21/2017   Knee joint replacement by other means 09/24/2013    PCP: Chrystal Lamarr RAMAN, MD   REFERRING PROVIDER: Chrystal Lamarr RAMAN, MD   REFERRING DIAG: N39.46 (ICD-10-CM) - Mixed incontinence  THERAPY DIAG:  Muscle weakness (generalized)  Unspecified lack of coordination  Abnormal posture  Rationale for Evaluation and Treatment: Rehabilitation  ONSET DATE: 8 months and worsening since this  SUBJECTIVE:  SUBJECTIVE STATEMENT: Moved and cleaned a lot this weekend and no leakage. Does still wear pad but reports its larger size then she needs and wears it just in case.   Fluid intake: water - varies based on the day usually 4 glasses; 2 cups of coffee daily, maybe a tea with lunch  PAIN:  Are you having pain? No   PRECAUTIONS: None  RED FLAGS: None   WEIGHT BEARING RESTRICTIONS: No  FALLS:  Has patient fallen in last 6 months?  No  OCCUPATION: retired   ACTIVITY LEVEL : low - moderate right now doing deep cleaning of room by room of her house; treadmill every other day  PLOF: Independent  PATIENT GOALS: to have no urinary incontinence and have more regular bowel movements - pt retired to take of first grandchild and very fearful about leakage with lifting and carrying baby   PERTINENT HISTORY:  Left renal artery stenosis, diverticulitis, AKI , Benign hypertension with chronic kidney disease, stage III, NSTEMI, Anxiety, SVT , Hypertension, Diastolic congestive heart failure , RENAL ANGIOGRAPHY, Rt TKA,  Sexual abuse: No  BOWEL MOVEMENT: Pain with bowel movement: No Type of bowel movement:Type (Bristol Stool Scale) 1, Frequency 3-4x weekly , and Strain yes Fully empty rectum: No Leakage: No Pads: No Fiber supplement/laxative No  URINATION: Pain with urination: No Fully empty bladder: Yes:   Stream: Strong Urgency: No Frequency: not at night; varies sometimes a little faster than 2 hours but not usually Leakage: Coughing, Sneezing, Laughing, Exercise, and Lifting Pads: Yes: 3 size pads 6 daily nothing a night   INTERCOURSE:  Ability to have vaginal penetration Yes  Pain with intercourse: none DrynessNo Climax: not painful  Marinoff Scale: 0/3  PREGNANCY: Vaginal deliveries 2 Tearing No Episiotomy No C-section deliveries 0 Currently pregnant No  PROLAPSE: None   OBJECTIVE:  Note: Objective measures were completed at Evaluation unless otherwise noted.  DIAGNOSTIC FINDINGS:    PATIENT SURVEYS:    Urogenital Distress Inventory (UDI-6 Short Form) Score = 33/100  COGNITION: Overall cognitive status: Within functional limits for tasks assessed     SENSATION: Light touch: Appears intact  LUMBAR SPECIAL TESTS:  Single leg stance test: 6s Rt, 5s Lt with pelvic rotation mildly and need to toe touch and SI Compression/distraction test: Negative  FUNCTIONAL TESTS:  Sit up test -  1/3  GAIT: WFL  POSTURE: rounded shoulders and forward head   LUMBARAROM/PROM:  A/PROM A/PROM  eval  Flexion Limited by 25%  Extension Limited by 25%  Right lateral flexion WFL  Left lateral flexion WFL  Right rotation Limited by 25%  Left rotation Limited by 25%   (Blank rows = not tested)  LOWER EXTREMITY ROM:  Bil hamstrings and adductors limited by 25%  LOWER EXTREMITY MMT:  Bil hips grossly 3+/5 PALPATION:   General: mild tightness at bil lumbar paraspinals   Pelvic Alignment: WFL  Abdominal: mild tension in lower quadrants                External Perineal Exam: mild redness throughout vulva                             Internal Pelvic Floor: no TTP  Patient confirms identification and approves PT to assess internal pelvic floor and treatment Yes No emotional/communication barriers or cognitive limitation. Patient is motivated to learn. Patient understands and agrees with treatment goals and plan. PT explains patient will be examined in standing,  sitting, and lying down to see how their muscles and joints work. When they are ready, they will be asked to remove their underwear so PT can examine their perineum. The patient is also given the option of providing their own chaperone as one is not provided in our facility. The patient also has the right and is explained the right to defer or refuse any part of the evaluation or treatment including the internal exam. With the patient's consent, PT will use one gloved finger to gently assess the muscles of the pelvic floor, seeing how well it contracts and relaxes and if there is muscle symmetry. After, the patient will get dressed and PT and patient will discuss exam findings and plan of care. PT and patient discuss plan of care, schedule, attendance policy and HEP activities.   PELVIC MMT:   MMT eval  Vaginal 2/5 (improved to 3/5 with reps) for 5s, 5 reps  Internal Anal Sphincter   External Anal Sphincter   Puborectalis    Diastasis Recti   (Blank rows = not tested)        TONE: Slightly decreased   PROLAPSE: Not seen in hooklying with cough  TODAY'S TREATMENT:                                                                                                                              DATE:   04/10/24: Hooklying hip abduction blue band + pelvic floor contraction and exhale 2x10 Opposite hand/knee ball blue band 2x10 Sidelying ball press with hip abduction blue band 2x10 each + pelvic floor contraction and exhale Sit to stands 10# from mat table + pelvic floor activation + exhale  Squats 10# x10 + pelvic floor contractions + exhale 15# sit to stands +pelvic floor contractions +exhale (leakage on first rep then none) Farmer's carry 15# 1000' each hand  04/17/24: Kallie with green  band hip abduction 2x10 with exhale and pelvic floor activation Bridges with alt marching green band 2x10 Bird dogs x10 each  Sit to stands x10 10# with pelvic floor activations with exhale Sit to stands x10 13# with pelvic floor activations with exhale  Box carry 20# 1000' 6# over head hold walking 50' each Nu step 5 mins L5 with cues for pelvic floor activation 3x10 intermittent throughout this for continued pelvic floor activation with mobility.   04/24/24: Squats 20# 2x10 Standing mario punches 3# each 2x10 Lateral stepping 3# each 2x10 steps  Dead bugs 2x10 2x10 bridges  Sit to stands with OHP 10# 2x10   PATIENT EDUCATION:  Education details: [id number] Person educated: Patient Education method: Explanation, Demonstration, Tactile cues, Verbal cues, and Handouts Education comprehension: verbalized understanding, returned demonstration, verbal cues required, tactile cues required, and needs further education  HOME EXERCISE PROGRAM: [id number]  ASSESSMENT:  CLINICAL IMPRESSION: Patient is a 68 y.o. female  who was seen today for physical therapy treatment for urinary incontinence with stressors, pt denies any  leakage with  urge. Pt reports she continues to see improvement, able to complete house work with cleaning and moving a lot of items with spouse without leakage. Tolerated session well today, no pain or leakage did increase challenge with more lateral movement and some single legs. Pt would benefit from additional PT to further address deficits.       OBJECTIVE IMPAIRMENTS: decreased cognition, decreased coordination, decreased mobility, decreased strength, increased fascial restrictions, impaired perceived functional ability, impaired flexibility, improper body mechanics, and postural dysfunction.   ACTIVITY LIMITATIONS: carrying, lifting, squatting, continence, locomotion level, and caring for others  PARTICIPATION LIMITATIONS: cleaning, shopping, community activity, and yard work  PERSONAL FACTORS: Fitness, Time since onset of injury/illness/exacerbation, and 1 comorbidity: medical history are also affecting patient's functional outcome.   REHAB POTENTIAL: Good  CLINICAL DECISION MAKING: Stable/uncomplicated  EVALUATION COMPLEXITY: Low   GOALS: Goals reviewed with patient? Yes  SHORT TERM GOALS: Target date: 03/07/24  Pt to be I with HEP for carry over and continuing recommendations for improved outcomes.   Baseline: Goal status: MET  2.  Pt to be I with pressure management for body weight mechanics to decreased strain at pelvic floor for no leakage with grocery shopping.  Baseline:  Goal status: MET  3.  Pt will be independent with use of squatty potty, relaxed toileting mechanics, and improved bowel movement techniques in order to increase ease of bowel movements and complete evacuation.   Baseline:  Goal status: MET  4.  Pt will be independent with the knack, urge suppression technique, and double voiding in order to improve bladder habits and decrease urinary incontinence.   Baseline:  Goal status: MET   LONG TERM GOALS: Target date: 05/10/24  Pt to be I with advanced  HEP for carry over and continuing recommendations for improved outcomes.   Baseline:  Goal status: MET  2.  Pt will report increased bowel movements per week due to improved muscle tone and coordination with bowel movements and complete evacuation at least 75% of the time.  Baseline:  Goal status: MET  3.  Pt to demonstrate improved coordination of pelvic floor and breathing mechanics with 20# squat with appropriate synergistic patterns to decrease pain and leakage at least 75% of the time for improved ability to complete a 30 minute walk without strain at pelvic floor and symptoms.    Baseline:  Goal status: on going  4.  Pt to demonstrate 20# farmers carry at least 1000' to simulate carrying grandchild and their supplies without urinary incontinence.  Baseline:  Goal status: on going  5.  Pt to demonstrate at least 2/3 sit up test for improved core strength to decreased strain at pelvic floor and decreased urinary incontinence with stressors for improved skin integrity.  Baseline:  Goal status: on going  6.  Pt to demonstrate at least 5/5 bil hip strength for improved pelvic stability and functional squats without leakage.  Baseline:  Goal status: on going  PLAN:  PT FREQUENCY: 2x/week  PT DURATION: 16 sessions  PLANNED INTERVENTIONS: 97110-Therapeutic exercises, 97530- Therapeutic activity, [id number]- Neuromuscular re-education, 97535- Self Care, [id number]- Manual therapy, [phone]- Canalith repositioning, [id number]- Aquatic Therapy, [phone]- Electrical stimulation (manual), [id number]- Vasopneumatic device, [id number]- Ultrasound, [id number] (1-2 muscles), [id number] (3+ muscles)- Dry Needling, Patient/Family education, Taping, Joint mobilization, Spinal mobilization, Scar mobilization, DME instructions, Cryotherapy, Moist heat, and Biofeedback  PLAN FOR NEXT SESSION:  core and hip strengthening, coordination of pelvic floor,  pressure management   Darryle Navy, PT, DPT 10/14/252:01 PM  [facility] [address], [address] [place], [place] [address] Phone # [phone] Fax [phone]

## 2024-04-26 ENCOUNTER — Encounter: Admitting: Physical Therapy

## 2024-05-01 ENCOUNTER — Ambulatory Visit: Admitting: Physical Therapy

## 2024-05-01 DIAGNOSIS — M6281 Muscle weakness (generalized): Secondary | ICD-10-CM | POA: Diagnosis not present

## 2024-05-01 NOTE — Therapy (Signed)
 OUTPATIENT PHYSICAL THERAPY FEMALE PELVIC TREATMENT   Patient Name: Karen Jimenez MRN: [id number] DOB:[birth year], 68 y.o., female Today's Date: 05/01/2024  END OF SESSION:  PT End of Session - 05/01/24 1214     Visit Number 8    Date for Recertification  05/10/24    Authorization Type 05/10/2024    Progress Note Due on Visit 10    PT Start Time 1146    PT Stop Time 1230    PT Time Calculation (min) 44 min    Activity Tolerance Patient tolerated treatment well    Behavior During Therapy [facility] for tasks assessed/performed             Past Medical History:  Diagnosis Date   Anxiety 12/29/2017   Benign hypertension with chronic kidney disease, stage III (HCC) 02/25/2020   History of smoking    Hx of hyperlipidemia    Hyperlipidemia    Hypertension    Hyponatremia 02/13/2020   Left renal artery stenosis 02/13/2020   Low back pain 04/18/2020   Prediabetes 01/21/2017   SVT (supraventricular tachycardia) 01/21/2017   Vitamin D deficiency disease 01/21/2017   Past Surgical History:  Procedure Laterality Date   APPENDECTOMY     KNEE ARTHROSCOPY Right 2011, 2012   MEDIAL PARTIAL KNEE REPLACEMENT  11/2010   PERIPHERAL VASCULAR INTERVENTION  02/15/2022   Procedure: PERIPHERAL VASCULAR INTERVENTION;  Surgeon: Sheree Penne Bruckner, MD;  Location: [facility] INVASIVE CV LAB;  Service: Cardiovascular;;  LT Renal   RENAL ANGIOGRAPHY N/A 02/15/2022   Procedure: RENAL ANGIOGRAPHY;  Surgeon: Sheree Penne Bruckner, MD;  Location: [facility] INVASIVE CV LAB;  Service: Cardiovascular;  Laterality: N/A;   RENAL ANGIOGRAPHY N/A 04/04/2023   Procedure: RENAL ANGIOGRAPHY;  Surgeon: Sheree Penne Bruckner, MD;  Location: [facility] INVASIVE CV LAB;  Service: Cardiovascular;  Laterality: N/A;   TONSILLECTOMY AND ADENOIDECTOMY     Patient Active Problem List   Diagnosis Date Noted   CKD stage 3b, GFR 30-44 ml/min (HCC) 03/22/2023   Hypertensive CHF (congestive heart failure) (HCC) 03/22/2023   Diastolic  congestive heart failure (HCC) 03/21/2023   Acute on chronic diastolic CHF (congestive heart failure) (HCC) 03/21/2023   Acute pulmonary edema (HCC) 03/21/2023   Acute diverticulitis 06/27/2022   Hyperkalemia 06/27/2022   Hypertension 06/27/2022   AKI (acute kidney injury) 06/27/2022   CAD (coronary artery disease) 06/27/2022   Low back pain 04/18/2020   Weight gain 02/25/2020   Benign hypertension with chronic kidney disease, stage III (HCC) 02/25/2020   Hyponatremia 02/13/2020   Left renal artery stenosis 02/13/2020   Persistent proteinuria 02/13/2020   Hypertensive urgency 02/04/2020   Anxiety 12/29/2017   NSTEMI (non-ST elevated myocardial infarction) (HCC) 01/21/2017   Prediabetes 01/21/2017   Pure hypercholesterolemia 01/21/2017   SVT (supraventricular tachycardia) 01/21/2017   Vitamin D deficiency disease 01/21/2017   Paronychia of finger 01/21/2017   Knee joint replacement by other means 09/24/2013    PCP: Chrystal Lamarr RAMAN, MD   REFERRING PROVIDER: Chrystal Lamarr RAMAN, MD   REFERRING DIAG: N39.46 (ICD-10-CM) - Mixed incontinence  THERAPY DIAG:  Muscle weakness (generalized)  Rationale for Evaluation and Treatment: Rehabilitation  ONSET DATE: 8 months and worsening since this  SUBJECTIVE:  SUBJECTIVE STATEMENT: Pt reports she is doing so much better, only sees urinary incontinence when in a hurry and doesn't take time to think about movement and pelvic floor coordination. Is able to wear reusable pad/underwear now without leakage wetting pants and dry more often than not.   Fluid intake: water - varies based on the day usually 4 glasses; 2 cups of coffee daily, maybe a tea with lunch  PAIN:  Are you having pain? No   PRECAUTIONS: None  RED FLAGS: None   WEIGHT BEARING  RESTRICTIONS: No  FALLS:  Has patient fallen in last 6 months? No  OCCUPATION: retired   ACTIVITY LEVEL : low - moderate right now doing deep cleaning of room by room of her house; treadmill every other day  PLOF: Independent  PATIENT GOALS: to have no urinary incontinence and have more regular bowel movements - pt retired to take of first grandchild and very fearful about leakage with lifting and carrying baby   PERTINENT HISTORY:  Left renal artery stenosis, diverticulitis, AKI , Benign hypertension with chronic kidney disease, stage III, NSTEMI, Anxiety, SVT , Hypertension, Diastolic congestive heart failure , RENAL ANGIOGRAPHY, Rt TKA,  Sexual abuse: No  BOWEL MOVEMENT: Pain with bowel movement: No Type of bowel movement:Type (Bristol Stool Scale) 1, Frequency 3-4x weekly , and Strain yes Fully empty rectum: No Leakage: No Pads: No Fiber supplement/laxative No  URINATION: Pain with urination: No Fully empty bladder: Yes:   Stream: Strong Urgency: No Frequency: not at night; varies sometimes a little faster than 2 hours but not usually Leakage: Coughing, Sneezing, Laughing, Exercise, and Lifting Pads: Yes: 3 size pads 6 daily nothing a night   INTERCOURSE:  Ability to have vaginal penetration Yes  Pain with intercourse: none DrynessNo Climax: not painful  Marinoff Scale: 0/3  PREGNANCY: Vaginal deliveries 2 Tearing No Episiotomy No C-section deliveries 0 Currently pregnant No  PROLAPSE: None   OBJECTIVE:  Note: Objective measures were completed at Evaluation unless otherwise noted.  DIAGNOSTIC FINDINGS:    PATIENT SURVEYS:    Urogenital Distress Inventory (UDI-6 Short Form) Score = 33/100  COGNITION: Overall cognitive status: Within functional limits for tasks assessed     SENSATION: Light touch: Appears intact  LUMBAR SPECIAL TESTS:  Single leg stance test: 6s Rt, 5s Lt with pelvic rotation mildly and need to toe touch and SI  Compression/distraction test: Negative  FUNCTIONAL TESTS:  Sit up test - 1/3  GAIT: WFL  POSTURE: rounded shoulders and forward head   LUMBARAROM/PROM:  A/PROM A/PROM  eval  Flexion Limited by 25%  Extension Limited by 25%  Right lateral flexion WFL  Left lateral flexion WFL  Right rotation Limited by 25%  Left rotation Limited by 25%   (Blank rows = not tested)  LOWER EXTREMITY ROM:  Bil hamstrings and adductors limited by 25%  LOWER EXTREMITY MMT:  Bil hips grossly 3+/5 PALPATION:   General: mild tightness at bil lumbar paraspinals   Pelvic Alignment: WFL  Abdominal: mild tension in lower quadrants                External Perineal Exam: mild redness throughout vulva                             Internal Pelvic Floor: no TTP  Patient confirms identification and approves PT to assess internal pelvic floor and treatment Yes No emotional/communication barriers or cognitive limitation. Patient is motivated to learn.  Patient understands and agrees with treatment goals and plan. PT explains patient will be examined in standing, sitting, and lying down to see how their muscles and joints work. When they are ready, they will be asked to remove their underwear so PT can examine their perineum. The patient is also given the option of providing their own chaperone as one is not provided in our facility. The patient also has the right and is explained the right to defer or refuse any part of the evaluation or treatment including the internal exam. With the patient's consent, PT will use one gloved finger to gently assess the muscles of the pelvic floor, seeing how well it contracts and relaxes and if there is muscle symmetry. After, the patient will get dressed and PT and patient will discuss exam findings and plan of care. PT and patient discuss plan of care, schedule, attendance policy and HEP activities.   PELVIC MMT:   MMT eval  Vaginal 2/5 (improved to 3/5 with reps) for 5s, 5  reps  Internal Anal Sphincter   External Anal Sphincter   Puborectalis   Diastasis Recti   (Blank rows = not tested)        TONE: Slightly decreased   PROLAPSE: Not seen in hooklying with cough  TODAY'S TREATMENT:                                                                                                                              DATE:     04/17/24: Kallie with green  band hip abduction 2x10 with exhale and pelvic floor activation Bridges with alt marching green band 2x10 Bird dogs x10 each  Sit to stands x10 10# with pelvic floor activations with exhale Sit to stands x10 13# with pelvic floor activations with exhale  Box carry 20# 1000' 6# over head hold walking 50' each Nu step 5 mins L5 with cues for pelvic floor activation 3x10 intermittent throughout this for continued pelvic floor activation with mobility.   04/24/24: Squats 20# 2x10 Standing mario punches 3# each 2x10 Lateral stepping 3# each 2x10 steps  Dead bugs 2x10 2x10 bridges  Sit to stands with OHP 10# 2x10  05/01/24: Pt declined internal pelvic floor reassessment Reviewed all goals and HEP Pt educated on vaginal weights as she wanted more to do at home on her own 2x10 squats with 10# with pelvic floor activation and exhale X10 squats with 20#  Farmers carry 20# 1500'  PATIENT EDUCATION:  Education details: [id number] Person educated: Patient Education method: Explanation, Demonstration, Tactile cues, Verbal cues, and Handouts Education comprehension: verbalized understanding, returned demonstration, verbal cues required, tactile cues required, and needs further education  HOME EXERCISE PROGRAM: [id number]  ASSESSMENT:  CLINICAL IMPRESSION: Patient is a 68 y.o. female  who was seen today for physical therapy treatment for urinary incontinence with stressors, pt denies any leakage with urge. Pt reports being 90% improved since starting PT, has met all goals. Did  deny internal reassessment as her  symptoms have improved. Understands this will erve as DC and she would need new referral for future PT.   OBJECTIVE IMPAIRMENTS: decreased cognition, decreased coordination, decreased mobility, decreased strength, increased fascial restrictions, impaired perceived functional ability, impaired flexibility, improper body mechanics, and postural dysfunction.   ACTIVITY LIMITATIONS: carrying, lifting, squatting, continence, locomotion level, and caring for others  PARTICIPATION LIMITATIONS: cleaning, shopping, community activity, and yard work  PERSONAL FACTORS: Fitness, Time since onset of injury/illness/exacerbation, and 1 comorbidity: medical history are also affecting patient's functional outcome.   REHAB POTENTIAL: Good  CLINICAL DECISION MAKING: Stable/uncomplicated  EVALUATION COMPLEXITY: Low   GOALS: Goals reviewed with patient? Yes  SHORT TERM GOALS: Target date: 03/07/24  Pt to be I with HEP for carry over and continuing recommendations for improved outcomes.   Baseline: Goal status: MET  2.  Pt to be I with pressure management for body weight mechanics to decreased strain at pelvic floor for no leakage with grocery shopping.  Baseline:  Goal status: MET  3.  Pt will be independent with use of squatty potty, relaxed toileting mechanics, and improved bowel movement techniques in order to increase ease of bowel movements and complete evacuation.   Baseline:  Goal status: MET  4.  Pt will be independent with the knack, urge suppression technique, and double voiding in order to improve bladder habits and decrease urinary incontinence.   Baseline:  Goal status: MET   LONG TERM GOALS: Target date: 05/10/24  Pt to be I with advanced HEP for carry over and continuing recommendations for improved outcomes.   Baseline:  Goal status: MET  2.  Pt will report increased bowel movements per week due to improved muscle tone and coordination with bowel movements and complete  evacuation at least 75% of the time.  Baseline:  Goal status: MET  3.  Pt to demonstrate improved coordination of pelvic floor and breathing mechanics with 20# squat with appropriate synergistic patterns to decrease pain and leakage at least 75% of the time for improved ability to complete a 30 minute walk without strain at pelvic floor and symptoms.    Baseline:  Goal statusMET  4.  Pt to demonstrate 20# farmers carry at least 1000' to simulate carrying grandchild and their supplies without urinary incontinence.  Baseline:  Goal status: MET  5.  Pt to demonstrate at least 2/3 sit up test for improved core strength to decreased strain at pelvic floor and decreased urinary incontinence with stressors for improved skin integrity.  Baseline:  Goal status: MET  6.  Pt to demonstrate at least 5/5 bil hip strength for improved pelvic stability and functional squats without leakage.  Baseline:  Goal status: MET  PLAN:  PT FREQUENCY: 2x/week  PT DURATION: 16 sessions  PLANNED INTERVENTIONS: 97110-Therapeutic exercises, 97530- Therapeutic activity, [id number]- Neuromuscular re-education, 97535- Self Care, [id number]- Manual therapy, [phone]- Canalith repositioning, [id number]- Aquatic Therapy, [phone]- Electrical stimulation (manual), [id number]- Vasopneumatic device, [id number]- Ultrasound, [id number] (1-2 muscles), [id number] (3+ muscles)- Dry Needling, Patient/Family education, Taping, Joint mobilization, Spinal mobilization, Scar mobilization, DME instructions, Cryotherapy, Moist heat, and Biofeedback  PLAN FOR NEXT SESSION:    PHYSICAL THERAPY DISCHARGE SUMMARY  Visits from Start of Care: 8  Current functional level related to goals / functional outcomes: All goals met   Remaining deficits: mild urinary incontinence inconsistent    Education / Equipment: HEP   Patient agrees to discharge. Patient goals were met. Patient is being discharged due to meeting the stated rehab  goals.   Darryle Navy, PT,  DPT 05/02/2511:33 PM  [facility] [address], [address] [place], [place] [address] Phone # [phone] Fax [phone]

## 2024-05-03 ENCOUNTER — Encounter: Admitting: Physical Therapy

## 2024-05-08 ENCOUNTER — Encounter: Admitting: Physical Therapy

## 2024-05-09 ENCOUNTER — Encounter: Payer: Self-pay | Admitting: Vascular Surgery

## 2024-05-09 ENCOUNTER — Ambulatory Visit (INDEPENDENT_AMBULATORY_CARE_PROVIDER_SITE_OTHER): Admitting: Vascular Surgery

## 2024-05-09 ENCOUNTER — Ambulatory Visit (HOSPITAL_COMMUNITY)
Admission: RE | Admit: 2024-05-09 | Discharge: 2024-05-09 | Disposition: A | Discharge status: Home / Self Care | Source: Ambulatory Visit | Attending: Vascular Surgery | Admitting: Vascular Surgery

## 2024-05-09 VITALS — BP 129/73 | HR 63 | Temp 97.8°F | Ht 64.0 in | Wt 177.0 lb

## 2024-05-09 DIAGNOSIS — I701 Atherosclerosis of renal artery: Secondary | ICD-10-CM

## 2024-05-09 NOTE — Progress Notes (Signed)
 Patient ID: Karen Jimenez, female   DOB: [birth year], 68 y.o.   MRN: [id number]  Reason for Consult: Follow-up   Referred by Karen Jimenez RAMAN, *  Subjective:     HPI:  Karen Jimenez is a 68 y.o. female history of left renal artery stenting with subsequent drug-coated balloon angioplasty due to in-stent restenosis.  She states that her blood pressure has been stable on 3 medications and overall she has been feeling well.  She is followed by nephrology with plans for evaluation later this week.  She really has no complaints related to today's visit.  She remains on aspirin  Plavix  and a statin.  Past Medical History:  Diagnosis Date   Anxiety 12/29/2017   Benign hypertension with chronic kidney disease, stage III (HCC) 02/25/2020   History of smoking    Hx of hyperlipidemia    Hyperlipidemia    Hypertension    Hyponatremia 02/13/2020   Left renal artery stenosis 02/13/2020   Low back pain 04/18/2020   Prediabetes 01/21/2017   SVT (supraventricular tachycardia) 01/21/2017   Vitamin D deficiency disease 01/21/2017   Family History  Problem Relation Age of Onset   Colon cancer Mother 21   Lung cancer Father 35   Past Surgical History:  Procedure Laterality Date   APPENDECTOMY     KNEE ARTHROSCOPY Right 2011, 2012   MEDIAL PARTIAL KNEE REPLACEMENT  11/2010   PERIPHERAL VASCULAR INTERVENTION  02/15/2022   Procedure: PERIPHERAL VASCULAR INTERVENTION;  Surgeon: Sheree Penne Bruckner, MD;  Location: [facility] INVASIVE CV LAB;  Service: Cardiovascular;;  LT Renal   RENAL ANGIOGRAPHY N/A 02/15/2022   Procedure: RENAL ANGIOGRAPHY;  Surgeon: Sheree Penne Bruckner, MD;  Location: [facility] INVASIVE CV LAB;  Service: Cardiovascular;  Laterality: N/A;   RENAL ANGIOGRAPHY N/A 04/04/2023   Procedure: RENAL ANGIOGRAPHY;  Surgeon: Sheree Penne Bruckner, MD;  Location: [facility] INVASIVE CV LAB;  Service: Cardiovascular;  Laterality: N/A;   TONSILLECTOMY AND ADENOIDECTOMY      Short Social  History:  Social History   Tobacco Use   Smoking status: Former    Current packs/day: 0.50    Average packs/day: 0.5 packs/day for 30.0 years (15.0 ttl pk-yrs)    Types: Cigarettes   Smokeless tobacco: Never  Substance Use Topics   Alcohol use: No    Allergies  Allergen Reactions   Amoxicillin Anaphylaxis and Hives   Losartan Palpitations and Other (See Comments)    Severe AKI   Pseudoephedrine Hcl Palpitations    Heart rate increases   Hydralazine  Nausea Only and Rash   Amlodipine  Swelling    Pt currently taking in 10 mg    Hydrocodone Nausea And Vomiting    Headache   Hydrocodone-Acetaminophen  Nausea And Vomiting   Nitrofurantoin Other (See Comments)    Body aches    Current Outpatient Medications  Medication Sig Dispense Refill   aspirin  EC 81 MG tablet Take 81 mg by mouth daily. Swallow whole.     carvedilol  (COREG ) 25 MG tablet Take 25 mg by mouth 2 (two) times daily with a meal.     cloNIDine  (CATAPRES ) 0.1 MG tablet Take 1 tablet (0.1 mg total) by mouth at bedtime.     clopidogrel  (PLAVIX ) 75 MG tablet TAKE 1 TABLET(75 MG) BY MOUTH DAILY 30 tablet 6   doxazosin  (CARDURA ) 8 MG tablet Take 8 mg by mouth 2 (two) times daily.     furosemide  (LASIX ) 40 MG tablet Take 40 mg by mouth daily.     rosuvastatin  (  CRESTOR ) 10 MG tablet TAKE 1 TABLET(10 MG) BY MOUTH DAILY 30 tablet 0   No current facility-administered medications for this visit.    Review of Systems  Constitutional:  Constitutional negative. HENT: HENT negative.  Eyes: Eyes negative.  Respiratory: Respiratory negative.  Cardiovascular: Cardiovascular negative.  GI: Gastrointestinal negative.  Musculoskeletal: Musculoskeletal negative.  Skin: Skin negative.  Neurological: Neurological negative. Hematologic: Hematologic/lymphatic negative.  Psychiatric: Psychiatric negative.        Objective:  Objective   Vitals:   05/09/24 0908  BP: 129/73  Pulse: 63  Temp: 97.8 F (36.6 C)  SpO2: 98%   Weight: 177 lb (80.3 kg)  Height: 5' 4 (1.626 m)   Body mass index is 30.38 kg/m.  Physical Exam HENT:     Head: Normocephalic.     Nose: Nose normal.     Mouth/Throat:     Mouth: Mucous membranes are moist.  Cardiovascular:     Rate and Rhythm: Normal rate.  Pulmonary:     Effort: Pulmonary effort is normal.  Abdominal:     General: Abdomen is flat.  Musculoskeletal:        General: Normal range of motion.     Cervical back: Normal range of motion and neck supple.     Right lower leg: No edema.     Left lower leg: No edema.  Skin:    General: Skin is warm.     Capillary Refill: Capillary refill takes less than 2 seconds.  Neurological:     General: No focal deficit present.     Mental Status: She is alert.     Data: Duplex Findings:  +--------------------+--------+--------+------+----------------------+  Mesenteric         PSV cm/sEDV cm/sPlaque       Comments         +--------------------+--------+--------+------+----------------------+  Aorta Prox                                1.9 cm AP x 1.9 cm TRV  +--------------------+--------+--------+------+----------------------+  Aorta Mid             173                                         +--------------------+--------+--------+------+----------------------+  Celiac Artery Origin  119      17                                 +--------------------+--------+--------+------+----------------------+  SMA Proximal          205      32                                 +--------------------+--------+--------+------+----------------------+     +------------------+--------+--------+-------+  Right Renal ArteryPSV cm/sEDV cm/sComment  +------------------+--------+--------+-------+  Origin             197      31            +------------------+--------+--------+-------+  Proximal           160      35            +------------------+--------+--------+-------+  Mid  148      31            +------------------+--------+--------+-------+  Distal             103      20            +------------------+--------+--------+-------+   +-----------------+--------+--------+--------+  Left Renal ArteryPSV cm/sEDV cm/sComment   +-----------------+--------+--------+--------+  Origin            245      55             +-----------------+--------+--------+--------+  Proximal          300      67   tortuous  +-----------------+--------+--------+--------+  Mid               366      79   tortuous  +-----------------+--------+--------+--------+  Distal            120      23             +-----------------+--------+--------+--------+   +------------+--------+--------+----+-----------+--------+--------+----+  Right KidneyPSV cm/sEDV cm/sRI  Left KidneyPSV cm/sEDV cm/sRI    +------------+--------+--------+----+-----------+--------+--------+----+  Upper Pole  14      3       0.81Upper Pole 16      5       0.71  +------------+--------+--------+----+-----------+--------+--------+----+  Mid        15      3       0.        19      5       0.73  +------------+--------+--------+----+-----------+--------+--------+----+  Lower Pole  15      3       0.82Lower Pole 12      4       0.66  +------------+--------+--------+----+-----------+--------+--------+----+  Hilar      32      6       0.80Hilar      69      15      0.78  +------------+--------+--------+----+-----------+--------+--------+----+   +------------------+----+------------------+----+  Right Kidney          Left Kidney             +------------------+----+------------------+----+  RAR                  RAR                     +------------------+----+------------------+----+  RAR (manual)      1.14RAR (manual)      2.11  +------------------+----+------------------+----+  Cortex               Cortex                   +------------------+----+------------------+----+  Cortex thickness      Corex thickness         +------------------+----+------------------+----+  Kidney length (cm)8.0 Kidney length (cm)9.71  +------------------+----+------------------+----+       Summary:  Largest Aortic Diameter: 1.9 cm    Renal:    Right: Abnormal size for the right kidney. Abnormal right Resistive         Index. 1-59% stenosis of the right renal artery. RRV flow         present.  Left:  Normal size of left kidney. Abnormal left Resistive Index.         LRV flow present. 60-99% stenosis of the left renal artery,  s/p stent placement.  Mesenteric:  Normal Celiac artery and Superior Mesenteric artery findings.         Assessment/Plan:    68 year old female with history of left renal artery stenting and drug-coated balloon angioplasty of in-stent restenosis.  Velocities in the left renal artery stent are increasing however she remains asymptomatic with no increased need for blood pressure medications at this time and she is feeling very well.  Velocities are significantly decreased from the most recent intervention last year.  As such we will follow her up in 1 year with repeat renal artery duplex unless she has issues before that time.   Penne Lonni Colorado MD [facility]

## 2024-05-10 ENCOUNTER — Encounter: Admitting: Physical Therapy

## 2024-05-18 ENCOUNTER — Other Ambulatory Visit: Payer: Self-pay | Admitting: Vascular Surgery

## 2024-06-21 ENCOUNTER — Ambulatory Visit
Admission: EM | Admit: 2024-06-21 | Discharge: 2024-06-21 | Disposition: A | Discharge status: Home / Self Care | Source: Home / Self Care

## 2024-06-21 ENCOUNTER — Other Ambulatory Visit: Admitting: Radiology

## 2024-06-21 ENCOUNTER — Encounter: Payer: Self-pay | Admitting: Emergency Medicine

## 2024-06-21 ENCOUNTER — Ambulatory Visit (INDEPENDENT_AMBULATORY_CARE_PROVIDER_SITE_OTHER): Admitting: Radiology

## 2024-06-21 DIAGNOSIS — J209 Acute bronchitis, unspecified: Secondary | ICD-10-CM | POA: Diagnosis not present

## 2024-06-21 DIAGNOSIS — R051 Acute cough: Secondary | ICD-10-CM

## 2024-06-21 LAB — POC COVID19/FLU A&B COMBO
Covid Antigen, POC: NEGATIVE
Influenza A Antigen, POC: NEGATIVE
Influenza B Antigen, POC: NEGATIVE

## 2024-06-21 MED ORDER — PREDNISONE 10 MG PO TABS: 30.0000 mg | ORAL_TABLET | Freq: Every day | ORAL | 0 refills | Status: AC

## 2024-06-21 MED ORDER — AZITHROMYCIN 250 MG PO TABS: 250.0000 mg | ORAL_TABLET | Freq: Every day | ORAL | 0 refills | Status: AC

## 2024-06-21 MED ORDER — PROMETHAZINE-DM 6.25-15 MG/5ML PO SYRP: 5.0000 mL | ORAL_SOLUTION | Freq: Four times a day (QID) | ORAL | 0 refills | Status: AC | PRN

## 2024-06-21 NOTE — ED Provider Notes (Signed)
 [facility]    CSN: [id number] Arrival date & time: 06/21/24  9089      History   Chief Complaint Chief Complaint  Patient presents with   Cough    HPI Karen Jimenez is a 68 y.o. female.   Patient presents to clinic over concern of congestion, cough, fatigue and feeling unwell since Saturday, 5 days ago.  Initially patient had sore throat and maybe a fever, this has since improved.  She thought she was getting better but recently the cough has been worse.  Is having back pain when coughing and when she starts coughing she does have trouble stopping.  Has taken Delsym at night.  She was unsure really what to take due to her chronic kidney disease and renal stenosis.  Has not had any fevers recently.  Denies current sore throat.  Some rhinorrhea.  Mostly concerned about the cough.  A1C 1 year ago was 6.4 and Cr was 2.67 1 year ago.   The history is provided by the patient and medical records.  Cough   Past Medical History:  Diagnosis Date   Anxiety 12/29/2017   Benign hypertension with chronic kidney disease, stage III (HCC) 02/25/2020   History of smoking    Hx of hyperlipidemia    Hyperlipidemia    Hypertension    Hyponatremia 02/13/2020   Left renal artery stenosis 02/13/2020   Low back pain 04/18/2020   Prediabetes 01/21/2017   SVT (supraventricular tachycardia) 01/21/2017   Vitamin D deficiency disease 01/21/2017    Patient Active Problem List   Diagnosis Date Noted   CKD stage 3b, GFR 30-44 ml/min (HCC) 03/22/2023   Hypertensive CHF (congestive heart failure) (HCC) 03/22/2023   Diastolic congestive heart failure (HCC) 03/21/2023   Acute on chronic diastolic CHF (congestive heart failure) (HCC) 03/21/2023   Acute pulmonary edema (HCC) 03/21/2023   Acute diverticulitis 06/27/2022   Hyperkalemia 06/27/2022   Hypertension 06/27/2022   AKI (acute kidney injury) 06/27/2022   CAD (coronary artery disease) 06/27/2022   Low back pain 04/18/2020    Weight gain 02/25/2020   Benign hypertension with chronic kidney disease, stage III (HCC) 02/25/2020   Hyponatremia 02/13/2020   Left renal artery stenosis 02/13/2020   Persistent proteinuria 02/13/2020   Hypertensive urgency 02/04/2020   Anxiety 12/29/2017   NSTEMI (non-ST elevated myocardial infarction) (HCC) 01/21/2017   Prediabetes 01/21/2017   Pure hypercholesterolemia 01/21/2017   SVT (supraventricular tachycardia) 01/21/2017   Vitamin D deficiency disease 01/21/2017   Paronychia of finger 01/21/2017   Knee joint replacement by other means 09/24/2013    Past Surgical History:  Procedure Laterality Date   APPENDECTOMY     KNEE ARTHROSCOPY Right 2011, 2012   MEDIAL PARTIAL KNEE REPLACEMENT  11/2010   PERIPHERAL VASCULAR INTERVENTION  02/15/2022   Procedure: PERIPHERAL VASCULAR INTERVENTION;  Surgeon: Sheree Penne Bruckner, MD;  Location: [facility] INVASIVE CV LAB;  Service: Cardiovascular;;  LT Renal   RENAL ANGIOGRAPHY Karen Jimenez/A 02/15/2022   Procedure: RENAL ANGIOGRAPHY;  Surgeon: Sheree Penne Bruckner, MD;  Location: [facility] INVASIVE CV LAB;  Service: Cardiovascular;  Laterality: Karen Jimenez/A;   RENAL ANGIOGRAPHY Karen Jimenez/A 04/04/2023   Procedure: RENAL ANGIOGRAPHY;  Surgeon: Sheree Penne Bruckner, MD;  Location: [facility] INVASIVE CV LAB;  Service: Cardiovascular;  Laterality: Karen Jimenez/A;   TONSILLECTOMY AND ADENOIDECTOMY      OB History     Gravida  2   Para  2   Term  2   Preterm  0   AB  0  Living  2      SAB  0   IAB  0   Ectopic  0   Multiple  0   Live Births  2            Home Medications    Prior to Admission medications  Medication Sig Start Date End Date Taking? Authorizing Provider  azithromycin (ZITHROMAX) 250 MG tablet Take 1 tablet (250 mg total) by mouth daily. Take first 2 tablets together, then 1 every day until finished. 06/21/24  Yes Puanani Gene  Karen Jimenez, Karen Jimenez  predniSONE (DELTASONE) 10 MG tablet Take 3 tablets (30 mg total) by mouth daily with breakfast for 5 days.  06/21/24 06/26/24 Yes Arnell Mausolf  Karen Jimenez, Karen Jimenez  promethazine -dextromethorphan (PROMETHAZINE -DM) 6.25-15 MG/5ML syrup Take 5 mLs by mouth 4 (four) times daily as needed for cough. 06/21/24  Yes Sahith Nurse  Karen Jimenez, Karen Jimenez  aspirin  EC 81 MG tablet Take 81 mg by mouth daily. Swallow whole.    [provider]  carvedilol  (COREG ) 25 MG tablet Take 25 mg by mouth 2 (two) times daily with a meal.    [provider]  cloNIDine  (CATAPRES ) 0.1 MG tablet Take 1 tablet (0.1 mg total) by mouth at bedtime. 12/22/23   Vannie Reche RAMAN, NP  clopidogrel  (PLAVIX ) 75 MG tablet TAKE 1 TABLET(75 MG) BY MOUTH DAILY 10/12/23   Sheree Penne Bruckner, MD  doxazosin  (CARDURA ) 8 MG tablet Take 8 mg by mouth 2 (two) times daily. 03/03/23   [provider]  furosemide  (LASIX ) 40 MG tablet Take 40 mg by mouth daily. 09/23/22   [provider]  rosuvastatin  (CRESTOR ) 10 MG tablet TAKE 1 TABLET(10 MG) BY MOUTH DAILY 04/16/24   Sheree Penne Bruckner, MD    Family History Family History  Problem Relation Age of Onset   Colon cancer Mother 25   Lung cancer Father 28    Social History Social History[1]   Allergies   Amoxicillin, Losartan, Pseudoephedrine hcl, Hydralazine , Amlodipine , Hydrocodone, Hydrocodone-acetaminophen , and Nitrofurantoin   Review of Systems Review of Systems  Per HPI  Physical Exam Triage Vital Signs ED Triage Vitals  Encounter Vitals Group     BP 06/21/24 0919 124/69     Girls Systolic BP Percentile --      Girls Diastolic BP Percentile --      Boys Systolic BP Percentile --      Boys Diastolic BP Percentile --      Pulse Rate 06/21/24 0919 80     Resp 06/21/24 0919 17     Temp 06/21/24 0919 99.2 F (37.3 C)     Temp Source 06/21/24 0919 Oral     SpO2 06/21/24 0919 96 %     Weight --      Height --      Head Circumference --      Peak Flow --      Pain Score 06/21/24 0923 2     Pain Loc --      Pain Education --      Exclude from Growth Chart  --    No data found.  Updated Vital Signs BP 124/69 (BP Location: Right Arm)   Pulse 80   Temp 99.2 F (37.3 C) (Oral)   Resp 17   LMP 04/11/2009   SpO2 96%   Visual Acuity Right Eye Distance:   Left Eye Distance:   Bilateral Distance:    Right Eye Near:   Left Eye Near:    Bilateral Near:  Physical Exam Vitals and nursing note reviewed.  Constitutional:      Appearance: Normal appearance.  HENT:     Head: Normocephalic and atraumatic.     Right Ear: External ear normal.     Left Ear: External ear normal.     Nose: Congestion present.     Mouth/Throat:     Mouth: Mucous membranes are moist.  Eyes:     Conjunctiva/sclera: Conjunctivae normal.  Cardiovascular:     Rate and Rhythm: Normal rate and regular rhythm.     Heart sounds: Normal heart sounds. No murmur heard. Pulmonary:     Effort: Pulmonary effort is normal. No respiratory distress.     Breath sounds: Normal breath sounds. No wheezing.  Musculoskeletal:        General: Normal range of motion.  Skin:    General: Skin is warm and dry.  Neurological:     General: No focal deficit present.     Mental Status: She is alert and oriented to person, place, and time.  Psychiatric:        Mood and Affect: Mood normal.        Behavior: Behavior normal.      UC Treatments / Results  Labs (all labs ordered are listed, but only abnormal results are displayed) Labs Reviewed  POC COVID19/FLU A&B COMBO    EKG   Radiology No results found.  Procedures Procedures (including critical care time)  Medications Ordered in UC Medications - No data to display  Initial Impression / Assessment and Plan / UC Course  I have reviewed the triage vital signs and the nursing notes.  Pertinent labs & imaging results that were available during my care of the patient were reviewed by me and considered in my medical decision making (see chart for details).  Vitals and triage reviewed, patient is hemodynamically  stable. Lungs vesicular, heart w/ RRR. Congestion present.   POC covid and flu testing negative.   Due to rebound presentation and duration of almost a week now chest x-ray obtained.  Does not show acute pneumonia by my interpretation, official radiology overread is pending.  Suspect bronchitis.  Will cover with azithromycin due to rebound symptoms.  Steroids given for inflammation.  Cough management discussed.  Plan of care, follow-up care return precautions given, no questions at this time.     Final Clinical Impressions(s) / UC Diagnoses   Final diagnoses:  Acute cough  Acute bronchitis, unspecified organism     Discharge Instructions      Official radiology overread will be back over the next few hours and you will be called if we need to change her treatment plan Start the azithromycin and take this with food Start the prednisone and take this daily with breakfast to help with inflammation Use the cough medicine as needed, this may cause drowsiness so do not drink alcohol or drive on it Ensure you are staying well-hydrated with at least 64 ounces of water daily, can also consider 1200 mg of Mucinex daily to loosen secretions  Symptoms should improve over the next few days, if no improvement or any changes seek follow-up care       ED Prescriptions     Medication Sig Dispense Auth. Provider   azithromycin (ZITHROMAX) 250 MG tablet Take 1 tablet (250 mg total) by mouth daily. Take first 2 tablets together, then 1 every day until finished. 6 tablet Karen, Rodel Glaspy  Karen Jimenez, Karen Jimenez   promethazine -dextromethorphan (PROMETHAZINE -DM) 6.25-15 MG/5ML syrup Take 5 mLs by  mouth 4 (four) times daily as needed for cough. 118 mL Karen, Garron Eline  Karen Jimenez, Karen Jimenez   predniSONE (DELTASONE) 10 MG tablet Take 3 tablets (30 mg total) by mouth daily with breakfast for 5 days. 15 tablet Karen, Fisher Hargadon  Karen Jimenez, Karen Jimenez      PDMP not reviewed this encounter.     [1]  Social History Tobacco Use   Smoking  status: Former    Current packs/day: 0.50    Average packs/day: 0.5 packs/day for 30.0 years (15.0 ttl pk-yrs)    Types: Cigarettes   Smokeless tobacco: Never  Vaping Use   Vaping status: Never Used  Substance Use Topics   Alcohol use: No   Drug use: No     Karen Jimenez  Karen Jimenez, Karen Jimenez 06/21/24 1029

## 2024-06-21 NOTE — ED Triage Notes (Signed)
 Pt states she began feeling bad, congestion, cough,chest discomfort Saturday  Presents today due to cough, fatigue,  and back pain from coughing.

## 2024-06-21 NOTE — Discharge Instructions (Signed)
 Official radiology overread will be back over the next few hours and you will be called if we need to change her treatment plan Start the azithromycin and take this with food Start the prednisone and take this daily with breakfast to help with inflammation Use the cough medicine as needed, this may cause drowsiness so do not drink alcohol or drive on it Ensure you are staying well-hydrated with at least 64 ounces of water daily, can also consider 1200 mg of Mucinex daily to loosen secretions  Symptoms should improve over the next few days, if no improvement or any changes seek follow-up care
